# Patient Record
Sex: Male | Born: 1935 | Race: White | Hispanic: No | Smoking: Never smoker
Health system: Southern US, Community
[De-identification: ages and names within clinical notes are randomized; demographics above are authoritative.]

## PROBLEM LIST (undated history)

## (undated) DIAGNOSIS — M25519 Pain in unspecified shoulder: Secondary | ICD-10-CM

## (undated) DIAGNOSIS — I10 Essential (primary) hypertension: Secondary | ICD-10-CM

## (undated) DIAGNOSIS — IMO0001 Reserved for inherently not codable concepts without codable children: Secondary | ICD-10-CM

## (undated) DIAGNOSIS — E039 Hypothyroidism, unspecified: Secondary | ICD-10-CM

## (undated) DIAGNOSIS — Z951 Presence of aortocoronary bypass graft: Secondary | ICD-10-CM

## (undated) DIAGNOSIS — H269 Unspecified cataract: Secondary | ICD-10-CM

## (undated) DIAGNOSIS — C4360 Malignant melanoma of unspecified upper limb, including shoulder: Secondary | ICD-10-CM

## (undated) DIAGNOSIS — M25529 Pain in unspecified elbow: Secondary | ICD-10-CM

## (undated) DIAGNOSIS — R609 Edema, unspecified: Secondary | ICD-10-CM

## (undated) DIAGNOSIS — I4891 Unspecified atrial fibrillation: Secondary | ICD-10-CM

## (undated) DIAGNOSIS — I712 Thoracic aortic aneurysm, without rupture: Secondary | ICD-10-CM

## (undated) DIAGNOSIS — Z8719 Personal history of other diseases of the digestive system: Secondary | ICD-10-CM

## (undated) DIAGNOSIS — Z86010 Personal history of colonic polyps: Secondary | ICD-10-CM

## (undated) DIAGNOSIS — M79609 Pain in unspecified limb: Secondary | ICD-10-CM

## (undated) DIAGNOSIS — D485 Neoplasm of uncertain behavior of skin: Secondary | ICD-10-CM

## (undated) DIAGNOSIS — K219 Gastro-esophageal reflux disease without esophagitis: Secondary | ICD-10-CM

## (undated) DIAGNOSIS — E038 Other specified hypothyroidism: Secondary | ICD-10-CM

## (undated) DIAGNOSIS — I2510 Atherosclerotic heart disease of native coronary artery without angina pectoris: Secondary | ICD-10-CM

## (undated) DIAGNOSIS — E785 Hyperlipidemia, unspecified: Secondary | ICD-10-CM

## (undated) DIAGNOSIS — E538 Deficiency of other specified B group vitamins: Secondary | ICD-10-CM

## (undated) HISTORY — DX: Hypothyroidism, unspecified: E03.9

## (undated) HISTORY — DX: Neoplasm of uncertain behavior of skin: D48.5

## (undated) HISTORY — DX: Reserved for inherently not codable concepts without codable children: IMO0001

## (undated) HISTORY — DX: Presence of aortocoronary bypass graft: Z95.1

## (undated) HISTORY — DX: Pain in unspecified elbow: M25.529

## (undated) HISTORY — DX: Essential (primary) hypertension: I10

## (undated) HISTORY — DX: Personal history of other diseases of the digestive system: Z87.19

## (undated) HISTORY — DX: Deficiency of other specified B group vitamins: E53.8

## (undated) HISTORY — DX: Pain in unspecified shoulder: M25.519

## (undated) HISTORY — DX: Atherosclerotic heart disease of native coronary artery without angina pectoris: I25.10

## (undated) HISTORY — PX: OTHER SURGICAL HISTORY: SHX169

## (undated) HISTORY — PX: CORONARY ARTERY BYPASS GRAFT: SHX141

## (undated) HISTORY — DX: Other specified hypothyroidism: E03.8

## (undated) HISTORY — DX: Malignant melanoma of unspecified upper limb, including shoulder: C43.60

## (undated) HISTORY — DX: Edema, unspecified: R60.9

## (undated) HISTORY — DX: Personal history of colonic polyps: Z86.010

## (undated) HISTORY — PX: ROTATOR CUFF REPAIR: SHX139

## (undated) HISTORY — DX: Hyperlipidemia, unspecified: E78.5

## (undated) HISTORY — DX: Pain in unspecified limb: M79.609

## (undated) HISTORY — DX: Unspecified cataract: H26.9

## (undated) HISTORY — DX: Gastro-esophageal reflux disease without esophagitis: K21.9

---

## 1998-03-29 ENCOUNTER — Inpatient Hospital Stay (HOSPITAL_COMMUNITY): Admission: RE | Admit: 1998-03-29 | Discharge: 1998-03-31 | Payer: Self-pay | Admitting: Specialist

## 1998-03-29 ENCOUNTER — Encounter: Payer: Self-pay | Admitting: Specialist

## 2000-12-31 ENCOUNTER — Inpatient Hospital Stay (HOSPITAL_COMMUNITY): Admission: AD | Admit: 2000-12-31 | Discharge: 2001-01-10 | Payer: Self-pay | Admitting: Cardiology

## 2000-12-31 ENCOUNTER — Encounter: Payer: Self-pay | Admitting: Cardiology

## 2001-01-03 ENCOUNTER — Encounter: Payer: Self-pay | Admitting: Surgery

## 2001-01-05 ENCOUNTER — Encounter: Payer: Self-pay | Admitting: Surgery

## 2002-10-07 ENCOUNTER — Encounter: Payer: Self-pay | Admitting: Internal Medicine

## 2002-10-07 LAB — HM COLONOSCOPY

## 2004-01-25 ENCOUNTER — Ambulatory Visit: Payer: Self-pay | Admitting: Internal Medicine

## 2004-01-28 ENCOUNTER — Ambulatory Visit: Payer: Self-pay | Admitting: Internal Medicine

## 2004-04-11 ENCOUNTER — Ambulatory Visit: Payer: Self-pay | Admitting: Internal Medicine

## 2004-04-13 ENCOUNTER — Ambulatory Visit: Payer: Self-pay | Admitting: Internal Medicine

## 2004-09-26 ENCOUNTER — Ambulatory Visit: Payer: Self-pay | Admitting: Internal Medicine

## 2004-09-27 ENCOUNTER — Ambulatory Visit: Payer: Self-pay | Admitting: Cardiology

## 2004-09-29 ENCOUNTER — Ambulatory Visit: Payer: Self-pay | Admitting: Internal Medicine

## 2004-12-29 ENCOUNTER — Ambulatory Visit: Payer: Self-pay | Admitting: Internal Medicine

## 2005-01-20 ENCOUNTER — Ambulatory Visit: Payer: Self-pay | Admitting: Internal Medicine

## 2005-01-23 ENCOUNTER — Ambulatory Visit: Payer: Self-pay | Admitting: Internal Medicine

## 2005-01-25 ENCOUNTER — Ambulatory Visit: Payer: Self-pay | Admitting: Internal Medicine

## 2005-04-20 ENCOUNTER — Ambulatory Visit: Payer: Self-pay | Admitting: Internal Medicine

## 2005-04-25 ENCOUNTER — Ambulatory Visit: Payer: Self-pay | Admitting: Internal Medicine

## 2005-05-27 ENCOUNTER — Ambulatory Visit: Payer: Self-pay | Admitting: Family Medicine

## 2005-07-17 ENCOUNTER — Ambulatory Visit: Payer: Self-pay | Admitting: Cardiovascular Disease

## 2005-07-17 ENCOUNTER — Ambulatory Visit: Payer: Self-pay | Admitting: Internal Medicine

## 2005-07-19 ENCOUNTER — Ambulatory Visit: Payer: Self-pay | Admitting: Internal Medicine

## 2005-10-27 ENCOUNTER — Ambulatory Visit: Payer: Self-pay | Admitting: Internal Medicine

## 2005-10-31 ENCOUNTER — Ambulatory Visit: Payer: Self-pay | Admitting: Internal Medicine

## 2005-11-14 ENCOUNTER — Ambulatory Visit: Payer: Self-pay | Admitting: Cardiovascular Disease

## 2005-12-26 ENCOUNTER — Ambulatory Visit: Payer: Self-pay | Admitting: Cardiovascular Disease

## 2006-01-26 ENCOUNTER — Ambulatory Visit: Payer: Self-pay | Admitting: Internal Medicine

## 2006-01-26 LAB — CONVERTED CEMR LAB
Creatinine, Ser: 1.2 mg/dL (ref 0.4–1.5)
Glucose, Bld: 117 mg/dL — ABNORMAL HIGH (ref 70–99)
Potassium: 4 meq/L (ref 3.5–5.1)
Sodium: 142 meq/L (ref 135–145)

## 2006-01-30 ENCOUNTER — Ambulatory Visit: Payer: Self-pay | Admitting: Internal Medicine

## 2006-05-08 ENCOUNTER — Ambulatory Visit: Payer: Self-pay | Admitting: Internal Medicine

## 2006-05-08 LAB — CONVERTED CEMR LAB
BUN: 18 mg/dL (ref 6–23)
Cholesterol: 127 mg/dL (ref 0–200)
Creatinine, Ser: 1 mg/dL (ref 0.4–1.5)
Glucose, Bld: 95 mg/dL (ref 70–99)
HDL: 39.7 mg/dL (ref >39.0)
LDL Cholesterol: 70 mg/dL (ref 0–99)
Potassium: 3.9 meq/L (ref 3.5–5.1)
Sodium: 145 meq/L (ref 135–145)
TSH: 6.26 microintl units/mL — ABNORMAL HIGH (ref 0.35–5.50)
Total CHOL/HDL Ratio: 3.2
Triglycerides: 86 mg/dL (ref 0–149)
VLDL: 17 mg/dL (ref 0–40)

## 2006-05-10 ENCOUNTER — Ambulatory Visit: Payer: Self-pay | Admitting: Internal Medicine

## 2006-06-12 ENCOUNTER — Ambulatory Visit: Payer: Self-pay | Admitting: Pulmonary Disease

## 2006-07-30 ENCOUNTER — Ambulatory Visit: Payer: Self-pay | Admitting: Cardiovascular Disease

## 2006-08-13 ENCOUNTER — Ambulatory Visit: Payer: Self-pay | Admitting: Internal Medicine

## 2006-08-13 LAB — CONVERTED CEMR LAB
BUN: 17 mg/dL (ref 6–23)
CO2: 30 meq/L (ref 19–32)
Calcium: 9 mg/dL (ref 8.4–10.5)
Chloride: 107 meq/L (ref 96–112)
Creatinine, Ser: 0.9 mg/dL (ref 0.4–1.5)
GFR calc Af Amer: 105 mL/min
GFR calc non Af Amer: 87 mL/min
Glucose, Bld: 94 mg/dL (ref 70–99)
Potassium: 3.6 meq/L (ref 3.5–5.1)
Sodium: 142 meq/L (ref 135–145)
TSH: 5.16 microintl units/mL (ref 0.35–5.50)

## 2006-08-14 ENCOUNTER — Ambulatory Visit: Payer: Self-pay | Admitting: Internal Medicine

## 2006-11-28 ENCOUNTER — Ambulatory Visit: Payer: Self-pay

## 2006-12-14 ENCOUNTER — Encounter: Payer: Self-pay | Admitting: Internal Medicine

## 2006-12-14 DIAGNOSIS — E785 Hyperlipidemia, unspecified: Secondary | ICD-10-CM

## 2006-12-14 DIAGNOSIS — K219 Gastro-esophageal reflux disease without esophagitis: Secondary | ICD-10-CM

## 2006-12-14 DIAGNOSIS — E039 Hypothyroidism, unspecified: Secondary | ICD-10-CM | POA: Insufficient documentation

## 2006-12-14 DIAGNOSIS — Z860100 Personal history of colon polyps, unspecified: Secondary | ICD-10-CM

## 2006-12-14 DIAGNOSIS — I10 Essential (primary) hypertension: Secondary | ICD-10-CM

## 2006-12-14 DIAGNOSIS — I2510 Atherosclerotic heart disease of native coronary artery without angina pectoris: Secondary | ICD-10-CM

## 2006-12-14 HISTORY — DX: Atherosclerotic heart disease of native coronary artery without angina pectoris: I25.10

## 2006-12-14 HISTORY — DX: Personal history of colon polyps, unspecified: Z86.0100

## 2006-12-14 HISTORY — DX: Personal history of colonic polyps: Z86.010

## 2006-12-14 HISTORY — DX: Gastro-esophageal reflux disease without esophagitis: K21.9

## 2006-12-14 HISTORY — DX: Essential (primary) hypertension: I10

## 2006-12-14 HISTORY — DX: Hypothyroidism, unspecified: E03.9

## 2006-12-14 HISTORY — DX: Hyperlipidemia, unspecified: E78.5

## 2006-12-17 ENCOUNTER — Ambulatory Visit: Payer: Self-pay | Admitting: Internal Medicine

## 2006-12-17 LAB — CONVERTED CEMR LAB
ALT: 17 units/L (ref 0–53)
Albumin: 3.6 g/dL (ref 3.5–5.2)
Alkaline Phosphatase: 57 units/L (ref 39–117)
BUN: 18 mg/dL (ref 6–23)
CO2: 31 meq/L (ref 19–32)
Calcium: 9.1 mg/dL (ref 8.4–10.5)
GFR calc Af Amer: 105 mL/min
GFR calc non Af Amer: 87 mL/min
LDL Cholesterol: 74 mg/dL (ref 0–99)
Total CHOL/HDL Ratio: 3.5
VLDL: 15 mg/dL (ref 0–40)

## 2006-12-19 ENCOUNTER — Ambulatory Visit: Payer: Self-pay | Admitting: Internal Medicine

## 2006-12-19 DIAGNOSIS — M25519 Pain in unspecified shoulder: Secondary | ICD-10-CM

## 2006-12-19 HISTORY — DX: Pain in unspecified shoulder: M25.519

## 2007-01-01 ENCOUNTER — Emergency Department (HOSPITAL_COMMUNITY): Admission: EM | Admit: 2007-01-01 | Discharge: 2007-01-01 | Payer: Self-pay | Admitting: Emergency Medicine

## 2007-01-11 ENCOUNTER — Encounter: Payer: Self-pay | Admitting: Internal Medicine

## 2007-01-30 ENCOUNTER — Ambulatory Visit: Payer: Self-pay | Admitting: Cardiovascular Disease

## 2007-04-08 ENCOUNTER — Ambulatory Visit: Payer: Self-pay | Admitting: Internal Medicine

## 2007-04-08 DIAGNOSIS — E038 Other specified hypothyroidism: Secondary | ICD-10-CM

## 2007-04-08 HISTORY — DX: Other specified hypothyroidism: E03.8

## 2007-04-08 LAB — CONVERTED CEMR LAB
PSA: 2.38 ng/mL (ref 0.10–4.00)
TSH: 3.52 microintl units/mL (ref 0.35–5.50)

## 2007-04-10 ENCOUNTER — Ambulatory Visit: Payer: Self-pay | Admitting: Internal Medicine

## 2007-04-10 DIAGNOSIS — IMO0001 Reserved for inherently not codable concepts without codable children: Secondary | ICD-10-CM

## 2007-04-10 HISTORY — DX: Reserved for inherently not codable concepts without codable children: IMO0001

## 2007-08-05 ENCOUNTER — Ambulatory Visit: Payer: Self-pay | Admitting: Cardiovascular Disease

## 2007-08-05 ENCOUNTER — Ambulatory Visit: Payer: Self-pay | Admitting: Internal Medicine

## 2007-08-05 LAB — CONVERTED CEMR LAB
ALT: 18 units/L (ref 0–53)
AST: 27 units/L (ref 0–37)
Cholesterol: 128 mg/dL (ref 0–200)
HDL: 39.6 mg/dL (ref >39.0)
Total CK: 167 units/L (ref 7–195)
Total Protein: 6.5 g/dL (ref 6.0–8.3)
VLDL: 13 mg/dL (ref 0–40)
Vit D, 1,25-Dihydroxy: 35 (ref 30–89)

## 2007-08-06 ENCOUNTER — Encounter: Payer: Self-pay | Admitting: Internal Medicine

## 2007-08-07 ENCOUNTER — Ambulatory Visit: Payer: Self-pay | Admitting: Internal Medicine

## 2007-08-16 ENCOUNTER — Encounter: Payer: Self-pay | Admitting: Internal Medicine

## 2007-08-28 ENCOUNTER — Encounter: Admission: RE | Admit: 2007-08-28 | Discharge: 2007-08-28 | Payer: Self-pay | Admitting: Specialist

## 2007-11-19 ENCOUNTER — Telehealth: Payer: Self-pay | Admitting: Internal Medicine

## 2007-12-05 ENCOUNTER — Ambulatory Visit: Payer: Self-pay | Admitting: Internal Medicine

## 2007-12-05 LAB — CONVERTED CEMR LAB
BUN: 22 mg/dL (ref 6–23)
Chloride: 106 meq/L (ref 96–112)
GFR calc Af Amer: 92 mL/min
GFR calc non Af Amer: 76 mL/min
Glucose, Bld: 97 mg/dL (ref 70–99)
Potassium: 3.6 meq/L (ref 3.5–5.1)
Sodium: 142 meq/L (ref 135–145)
TSH: 3.92 microintl units/mL (ref 0.35–5.50)
Total CK: 115 units/L (ref 7–195)
VLDL: 23 mg/dL (ref 0–40)
Vitamin B-12: 208 pg/mL — ABNORMAL LOW (ref 211–911)

## 2007-12-09 ENCOUNTER — Ambulatory Visit: Payer: Self-pay | Admitting: Internal Medicine

## 2007-12-09 DIAGNOSIS — E538 Deficiency of other specified B group vitamins: Secondary | ICD-10-CM

## 2007-12-09 HISTORY — DX: Deficiency of other specified B group vitamins: E53.8

## 2008-03-16 ENCOUNTER — Telehealth: Payer: Self-pay | Admitting: Internal Medicine

## 2008-03-16 ENCOUNTER — Ambulatory Visit: Payer: Self-pay | Admitting: Internal Medicine

## 2008-03-17 LAB — CONVERTED CEMR LAB
ALT: 17 units/L (ref 0–53)
AST: 27 units/L (ref 0–37)
Albumin: 3.8 g/dL (ref 3.5–5.2)
Alkaline Phosphatase: 65 units/L (ref 39–117)
BUN: 19 mg/dL (ref 6–23)
Bilirubin, Direct: 0.2 mg/dL (ref 0.0–0.3)
CO2: 28 meq/L (ref 19–32)
GFR calc Af Amer: 104 mL/min
Glucose, Bld: 89 mg/dL (ref 70–99)
HDL: 33.7 mg/dL — ABNORMAL LOW (ref >39.0)
Potassium: 3.6 meq/L (ref 3.5–5.1)
Sodium: 142 meq/L (ref 135–145)
Total Protein: 6.2 g/dL (ref 6.0–8.3)

## 2008-03-18 ENCOUNTER — Ambulatory Visit: Payer: Self-pay | Admitting: Internal Medicine

## 2008-06-19 ENCOUNTER — Ambulatory Visit: Payer: Self-pay | Admitting: Internal Medicine

## 2008-06-19 LAB — CONVERTED CEMR LAB
ALT: 19 U/L (ref 0–53)
AST: 23 U/L (ref 0–37)
Albumin: 3.8 g/dL (ref 3.5–5.2)
Alkaline Phosphatase: 59 U/L (ref 39–117)
BUN: 19 mg/dL (ref 6–23)
Bilirubin, Direct: 0.2 mg/dL (ref 0.0–0.3)
CO2: 28 meq/L (ref 19–32)
Calcium: 9.4 mg/dL (ref 8.4–10.5)
Chloride: 111 meq/L (ref 96–112)
Cholesterol: 166 mg/dL (ref 0–200)
Creatinine, Ser: 0.9 mg/dL (ref 0.4–1.5)
GFR calc non Af Amer: 86.02 mL/min (ref >60)
Glucose, Bld: 88 mg/dL (ref 70–99)
HDL: 34.5 mg/dL — ABNORMAL LOW (ref >39.00)
LDL Cholesterol: 113 mg/dL — ABNORMAL HIGH (ref 0–99)
Potassium: 3.9 meq/L (ref 3.5–5.1)
Sodium: 144 meq/L (ref 135–145)
TSH: 4.85 u[IU]/mL (ref 0.35–5.50)
Total Bilirubin: 1 mg/dL (ref 0.3–1.2)
Total CHOL/HDL Ratio: 5
Total Protein: 6.5 g/dL (ref 6.0–8.3)
Triglycerides: 95 mg/dL (ref 0.0–149.0)
VLDL: 19 mg/dL (ref 0.0–40.0)
Vitamin B-12: 395 pg/mL (ref 211–911)

## 2008-06-23 ENCOUNTER — Ambulatory Visit: Payer: Self-pay | Admitting: Internal Medicine

## 2008-06-23 DIAGNOSIS — D485 Neoplasm of uncertain behavior of skin: Secondary | ICD-10-CM

## 2008-06-23 HISTORY — DX: Neoplasm of uncertain behavior of skin: D48.5

## 2008-07-15 ENCOUNTER — Ambulatory Visit: Payer: Self-pay | Admitting: Internal Medicine

## 2008-07-15 ENCOUNTER — Encounter: Payer: Self-pay | Admitting: Internal Medicine

## 2008-08-11 ENCOUNTER — Ambulatory Visit: Payer: Self-pay | Admitting: Internal Medicine

## 2008-08-26 DIAGNOSIS — Z8719 Personal history of other diseases of the digestive system: Secondary | ICD-10-CM

## 2008-08-26 DIAGNOSIS — Z951 Presence of aortocoronary bypass graft: Secondary | ICD-10-CM

## 2008-08-26 HISTORY — DX: Personal history of other diseases of the digestive system: Z87.19

## 2008-08-26 HISTORY — DX: Presence of aortocoronary bypass graft: Z95.1

## 2008-08-27 ENCOUNTER — Ambulatory Visit: Payer: Self-pay | Admitting: Cardiovascular Disease

## 2008-08-27 DIAGNOSIS — R609 Edema, unspecified: Secondary | ICD-10-CM

## 2008-08-27 HISTORY — DX: Edema, unspecified: R60.9

## 2008-09-07 ENCOUNTER — Telehealth: Payer: Self-pay | Admitting: Internal Medicine

## 2008-09-07 DIAGNOSIS — C4360 Malignant melanoma of unspecified upper limb, including shoulder: Secondary | ICD-10-CM

## 2008-09-07 HISTORY — DX: Malignant melanoma of unspecified upper limb, including shoulder: C43.60

## 2008-09-21 ENCOUNTER — Encounter: Payer: Self-pay | Admitting: Internal Medicine

## 2008-09-25 ENCOUNTER — Encounter: Payer: Self-pay | Admitting: Internal Medicine

## 2008-10-06 ENCOUNTER — Encounter: Payer: Self-pay | Admitting: Internal Medicine

## 2008-10-16 ENCOUNTER — Ambulatory Visit: Payer: Self-pay | Admitting: Internal Medicine

## 2008-10-16 LAB — CONVERTED CEMR LAB
BUN: 23 mg/dL (ref 6–23)
CO2: 31 meq/L (ref 19–32)
Chloride: 111 meq/L (ref 96–112)
Creatinine, Ser: 1 mg/dL (ref 0.4–1.5)
Folate: >20 ng/mL
Potassium: 3.8 meq/L (ref 3.5–5.1)
Vitamin B-12: 986 pg/mL — ABNORMAL HIGH (ref 211–911)

## 2008-10-19 ENCOUNTER — Ambulatory Visit: Payer: Self-pay | Admitting: Internal Medicine

## 2009-01-28 ENCOUNTER — Ambulatory Visit: Payer: Self-pay | Admitting: Internal Medicine

## 2009-01-28 LAB — CONVERTED CEMR LAB
Chloride: 107 meq/L (ref 96–112)
Cholesterol: 237 mg/dL — ABNORMAL HIGH (ref 0–200)
GFR calc non Af Amer: 76.06 mL/min (ref >60)
Glucose, Bld: 88 mg/dL (ref 70–99)
HDL: 34.2 mg/dL — ABNORMAL LOW (ref >39.00)
Potassium: 3.6 meq/L (ref 3.5–5.1)
Sodium: 141 meq/L (ref 135–145)
Total CHOL/HDL Ratio: 7
VLDL: 21.6 mg/dL (ref 0.0–40.0)

## 2009-01-29 ENCOUNTER — Encounter: Payer: Self-pay | Admitting: Internal Medicine

## 2009-02-01 ENCOUNTER — Ambulatory Visit: Payer: Self-pay | Admitting: Internal Medicine

## 2009-03-15 ENCOUNTER — Ambulatory Visit: Payer: Self-pay | Admitting: Cardiovascular Disease

## 2009-05-28 ENCOUNTER — Ambulatory Visit: Payer: Self-pay | Admitting: Internal Medicine

## 2009-05-28 LAB — CONVERTED CEMR LAB
CO2: 31 meq/L (ref 19–32)
Calcium: 9.2 mg/dL (ref 8.4–10.5)
Creatinine, Ser: 1 mg/dL (ref 0.4–1.5)
GFR calc non Af Amer: 75.99 mL/min (ref >60)
Glucose, Bld: 86 mg/dL (ref 70–99)
Total CHOL/HDL Ratio: 4
Total CK: 123 units/L (ref 7–232)
Vitamin B-12: 809 pg/mL (ref 211–911)

## 2009-06-01 ENCOUNTER — Ambulatory Visit: Payer: Self-pay | Admitting: Internal Medicine

## 2009-07-12 ENCOUNTER — Telehealth: Payer: Self-pay | Admitting: Internal Medicine

## 2009-10-04 ENCOUNTER — Ambulatory Visit: Payer: Self-pay | Admitting: Internal Medicine

## 2009-10-04 LAB — CONVERTED CEMR LAB
GFR calc non Af Amer: 101.14 mL/min (ref >60)
Potassium: 3.8 meq/L (ref 3.5–5.1)
Sodium: 143 meq/L (ref 135–145)

## 2009-10-06 ENCOUNTER — Ambulatory Visit: Payer: Self-pay | Admitting: Internal Medicine

## 2009-10-06 DIAGNOSIS — M25529 Pain in unspecified elbow: Secondary | ICD-10-CM

## 2009-10-06 HISTORY — DX: Pain in unspecified elbow: M25.529

## 2009-11-03 ENCOUNTER — Ambulatory Visit: Payer: Self-pay | Admitting: Internal Medicine

## 2010-01-07 ENCOUNTER — Ambulatory Visit: Payer: Self-pay | Admitting: Internal Medicine

## 2010-01-07 LAB — CONVERTED CEMR LAB
ALT: 14 units/L (ref 0–53)
AST: 25 units/L (ref 0–37)
Alkaline Phosphatase: 55 units/L (ref 39–117)
Bilirubin, Direct: 0.2 mg/dL (ref 0.0–0.3)
CO2: 28 meq/L (ref 19–32)
Calcium: 9 mg/dL (ref 8.4–10.5)
Chloride: 106 meq/L (ref 96–112)
HDL: 35 mg/dL — ABNORMAL LOW (ref >39.00)
Potassium: 4.1 meq/L (ref 3.5–5.1)
Sodium: 140 meq/L (ref 135–145)
Total CHOL/HDL Ratio: 4
Total Protein: 6 g/dL (ref 6.0–8.3)

## 2010-01-11 ENCOUNTER — Ambulatory Visit: Payer: Self-pay | Admitting: Internal Medicine

## 2010-01-29 ENCOUNTER — Telehealth: Payer: Self-pay | Admitting: Internal Medicine

## 2010-03-17 ENCOUNTER — Ambulatory Visit
Admission: RE | Admit: 2010-03-17 | Discharge: 2010-03-17 | Payer: Self-pay | Source: Home / Self Care | Attending: Cardiovascular Disease | Admitting: Cardiovascular Disease

## 2010-03-17 ENCOUNTER — Encounter: Payer: Self-pay | Admitting: Cardiovascular Disease

## 2010-03-31 NOTE — Miscellaneous (Signed)
Summary: Special procedure/[facility]  Special procedure/[facility]   Imported By: Lester [place] 11/05/2009 10:52:06  _____________________________________________________________________  External Attachment:    Type:   Image     Comment:   External Document

## 2010-03-31 NOTE — Progress Notes (Signed)
Summary: RF - B12  Phone Note From Pharmacy   Summary of Call: Pharm is req refill of B12 solution for injection. Not on med list, Please advise.  Initial call taken by: Lamar Sprinkles, CMA,  January 29, 2010 11:48 AM  Follow-up for Phone Call        ok x 12 months  Follow-up by: Tresa Garter MD,  January 29, 2010 1:37 PM    Prescriptions: COBAL-1000 1000 MCG/ML SOLN (CYANOCOBALAMIN) 1 cc q 2 wks sq  #10 x 12   Entered by:   Lamar Sprinkles, CMA   Authorized by:   Tresa Garter MD   Signed by:   Lamar Sprinkles, CMA on 01/31/2010   Method used:   Electronically to        [facility]  [address] [id number] (ret[facility]       [address]       [place], [place]  [address]       Ph: [phone] or [phone]       Fax: [phone]   RxID:   [id number]

## 2010-03-31 NOTE — Assessment & Plan Note (Signed)
Summary: f40mon  Medications Added CRESTOR 20 MG TABS (ROSUVASTATIN CALCIUM) 1 by mouth once daily (every 3rd day)      Allergies Added:   Referring Provider:  Sula Soda, MD Primary Provider:  Sula Soda, MD   History of Present Illness: Barry Molina is seen today for F/U of elevated lipids, CAD with CABG in 2002.  There was a quesiton of leg pain with statins but this may have been more of an orthopedic issue.  Since then he has been placed on Crestor every third day starting at 5mg  and now up to 20mg .  I think it would be better to just have him take 5mg /day and hie will discuss this with Dr. Nolon Lennert.  He had a normal myovue in 2008 with no SSCP and hie is active on a daily basis.    Current Problems (verified): 1)  Melanoma, Arm  (ICD-172.6) 2)  Melanoma, Upper Arm  (ICD-172.6) 3)  Edema  (ICD-782.3) 4)  Diverticulosis, Colon, Hx of  (ICD-V12.79) 5)  Hemorrhoids, Hx of  (ICD-V12.79) 6)  Allergic Rhinitis, Hx of  (ICD-477.9) 7)  Right Ankle Surgery,hx of  () 8)  Degenerative Disc Disease, Hx of  (ICD-722.6) 9)  Coronary Artery Bypass Graft, Hx of  (ICD-V45.81) 10)  Rotator Cuff Repair, Hx of Bilateral  (ICD-V45.89) 11)  Neoplasm, Skin, Uncertain Behavior  (ICD-238.2) 12)  B12 Deficiency  (ICD-266.2) 13)  Coronary Artery Disease  (ICD-414.00) 14)  Hyperlipidemia  (ICD-272.4) 15)  Hypothyroidism  (ICD-244.9) 16)  Hypertension  (ICD-401.9) 17)  Gerd  (ICD-530.81) 18)  Muscle Pain  (ICD-729.1) 19)  Other Specified Acquired Hypothyroidism  (ICD-244.8) 20)  Shoulder Pain  (ICD-719.41) 21)  Colonic Polyps, Hx of  (ICD-V12.72)  Current Medications (verified): 1)  Isosorbide Mononitrate Cr 30 Mg Tb24 (Isosorbide Mononitrate) .... 1/2 Once Daily 2)  Metoprolol Succinate 25 Mg Tb24 (Metoprolol Succinate) .... 1/2 By Mouth Qd 3)  Vitamin D3 1000 Unit Caps (Cholecalciferol) .Marland Kitchen.. 1 Tab By Mouth Once Daily 4)  Aspirin 325 Mg  Tbec (Aspirin) .... Once Daily 5)  Cobal-1000 1000 Mcg/ml  Soln (Cyanocobalamin) .Marland Kitchen.. 1 Cc Q 2 Wks Sq 6)  Bd Eclipse Syringe 27g X 1/2" 1 Ml Misc (Syringe/needle (Disp)) .... As Dirr 7)  Levothyroxine Sodium 112 Mcg Tabs (Levothyroxine Sodium) .Marland Kitchen.. 1 Tab By Mouth Once Daily 8)  Prilosec Otc 20 Mg Tbec (Omeprazole Magnesium) .... 2 By Mouth Qd 9)  Flonase 50 Mcg/act Susp (Fluticasone Propionate) .Marland Kitchen.. 1 Spr Each Nostr Qd As Needed 10)  Crestor 20 Mg Tabs (Rosuvastatin Calcium) .Marland Kitchen.. 1 By Mouth Once Daily (Every 3rd Day)  Allergies (verified): 1)  ! Morphine 2)  Vytorin  Past History:  Past Medical History: Last updated: 10/19/2008 DIVERTICULOSIS, COLON, HX OF (ICD-V12.79) HEMORRHOIDS, HX OF (ICD-V12.79) ALLERGIC RHINITIS, HX OF (ICD-477.9) DEGENERATIVE DISC DISEASE, HX OF (ICD-722.6) NEOPLASM, SKIN, UNCERTAIN BEHAVIOR (ICD-238.2) B12 DEFICIENCY (ICD-266.2) CORONARY ARTERY DISEASE (ICD-414.00) HYPERLIPIDEMIA (ICD-272.4) HYPOTHYROIDISM (ICD-244.9) HYPERTENSION (ICD-401.9) GERD (ICD-530.81) MUSCLE PAIN (ICD-729.1) OTHER SPECIFIED ACQUIRED HYPOTHYROIDISM (ICD-244.8) SHOULDER PAIN (ICD-719.41) COLONIC POLYPS, HX OF (ICD-V12.72) Melanoma in situ L arm 2010    Past Surgical History: Last updated: 08/26/2008 * RIGHT ANKLE SURGERY,HX OF CORONARY ARTERY BYPASS GRAFT, HX OF (ICD-V45.81) ROTATOR CUFF REPAIR, HX OF BILATERAL (ICD-V45.89)  Family History: Last updated: 08/26/2008 Family History Hypertension No FH of Colon Cancer.  Social History: Last updated: 08/26/2008 Retired 2 girls Married Never Smoked Alcohol Use - no Illicit Drug Use - no Patient gets regular exercise.  Review of Systems  Denies fever, malais, weight loss, blurry vision, decreased visual acuity, cough, sputum, SOB, hemoptysis, pleuritic pain, palpitaitons, heartburn, abdominal pain, melena, lower extremity edema, claudication, or rash. All other systems reviewed and negative  Vital Signs:  Patient profile:   77 year old male Height:      71  inches Weight:      221 pounds BMI:     30.93 Pulse rate:   70 / minute BP sitting:   112 / 74  (left arm) Cuff size:   regular  Vitals Entered By: Hardin Negus, RMA (March 15, 2009 10:49 AM)  Physical Exam  General:  Affect appropriate Healthy:  appears stated age HEENT: normal Neck supple with no adenopathy JVP normal no bruits no thyromegaly Lungs clear with no wheezing and good diaphragmatic motion Heart:  S1/S2 no murmur,rub, gallop or click PMI normal Abdomen: benighn, BS positve, no tenderness, no AAA no bruit.  No HSM or HJR Distal pulses intact with no bruits No edema Neuro non-focal Skin warm and dry    Impression & Recommendations:  Problem # 1:  CORONARY ARTERY BYPASS GRAFT, HX OF (ICD-V45.81) Stable no angina.  Continue ASA and Risk factor modification.  CABG in 2002 and nomal myovue in 2008  Problem # 2:  HYPERLIPIDEMIA (ICD-272.4) Needs to be on med given age of grafts but would prefer low dose of a daily statin.  To F/U with Plotnikov to discuss.  Dont think leg pain is from statin His updated medication list for this problem includes:    Crestor 20 Mg Tabs (Rosuvastatin calcium) .Marland Kitchen... 1 by mouth once daily (every 3rd day)  CHOL: 237 (01/28/2009)   LDL: 113 (06/19/2008)   HDL: 34.20 (01/28/2009)   TG: 108.0 (01/28/2009)  Patient Instructions: 1)  Your physician recommends that you schedule a follow-up appointment in:12 months. The office will mail you a reminder card 2 months prior appointment date.

## 2010-03-31 NOTE — Assessment & Plan Note (Signed)
Summary: yearly      Allergies Added:   Referring Provider:  Sula Soda, MD Primary Provider:  Sula Soda, MD  CC:  check up.  History of Present Illness: Barry Molina is seen today for F/U of elevated lipids, CAD with CABG in 2002.  There was a quesiton of leg pain with statins but this may have been more of an orthopedic issue.  Since then he has been placed on Crestor every third day starting at 5mg  and now up to 20mg .  I think it would be better to just have him take 5mg /day and hie will discuss this with Dr. Nolon Lennert.  He had a normal myovue in 2008 with no SSCP and hie is active on a daily basis.  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    Current Problems (verified): 1)  Elbow Pain  (ICD-719.42) 2)  Coronary Artery Disease  (ICD-414.00) 3)  Hypertension  (ICD-401.9) 4)  Coronary Artery Bypass Graft, Hx of  (ICD-V45.81) 5)  Hyperlipidemia  (ICD-272.4) 6)  Hypothyroidism  (ICD-244.9) 7)  B12 Deficiency  (ICD-266.2) 8)  Gerd  (ICD-530.81) 9)  Melanoma, Arm  (ICD-172.6) 10)  Melanoma, Upper Arm  (ICD-172.6) 11)  Edema  (ICD-782.3) 12)  Diverticulosis, Colon, Hx of  (ICD-V12.79) 13)  Hemorrhoids, Hx of  (ICD-V12.79) 14)  Allergic Rhinitis, Hx of  (ICD-477.9) 15)  Right Ankle Surgery,hx of  () 16)  Degenerative Disc Disease, Hx of  (ICD-722.6) 17)  Rotator Cuff Repair, Hx of Bilateral  (ICD-V45.89) 18)  Neoplasm, Skin, Uncertain Behavior  (ICD-238.2) 19)  Muscle Pain  (ICD-729.1) 20)  Other Specified Acquired Hypothyroidism  (ICD-244.8) 21)  Shoulder Pain  (ICD-719.41) 22)  Colonic Polyps, Hx of  (ICD-V12.72)  Current Medications (verified): 1)  Isosorbide Mononitrate Cr 30 Mg Tb24 (Isosorbide Mononitrate) .... 1/2 Once Daily 2)  Metoprolol Succinate 25 Mg Tb24 (Metoprolol Succinate) .... 1/2 By Mouth Qd 3)  Vitamin D3 1000 Unit Caps (Cholecalciferol) .Marland Kitchen.. 1 Tab By Mouth Once Daily 4)  Aspirin 325 Mg  Tbec (Aspirin) .... Once  Daily 5)  Cobal-1000 1000 Mcg/ml Soln (Cyanocobalamin) .Marland Kitchen.. 1 Cc Q 2 Wks Sq 6)  Bd Eclipse Syringe 27g X 1/2" 1 Ml Misc (Syringe/needle (Disp)) .... As Dirr 7)  Levothyroxine Sodium 112 Mcg Tabs (Levothyroxine Sodium) .Marland Kitchen.. 1 Tab By Mouth Once Daily 8)  Prilosec Otc 20 Mg Tbec (Omeprazole Magnesium) .Marland Kitchen.. 1 Once Daily 9)  Flonase 50 Mcg/act Susp (Fluticasone Propionate) .Marland Kitchen.. 1 Spr Each Nostr Qd As Needed 10)  Crestor 20 Mg Tabs (Rosuvastatin Calcium) .Marland Kitchen.. 1 By Mouth Once Daily (Every 3rd Day)  Allergies (verified): 1)  ! Morphine 2)  Vytorin  Past History:  Past Medical History: Last updated: 10/19/2008 DIVERTICULOSIS, COLON, HX OF (ICD-V12.79) HEMORRHOIDS, HX OF (ICD-V12.79) ALLERGIC RHINITIS, HX OF (ICD-477.9) DEGENERATIVE DISC DISEASE, HX OF (ICD-722.6) NEOPLASM, SKIN, UNCERTAIN BEHAVIOR (ICD-238.2) B12 DEFICIENCY (ICD-266.2) CORONARY ARTERY DISEASE (ICD-414.00) HYPERLIPIDEMIA (ICD-272.4) HYPOTHYROIDISM (ICD-244.9) HYPERTENSION (ICD-401.9) GERD (ICD-530.81) MUSCLE PAIN (ICD-729.1) OTHER SPECIFIED ACQUIRED HYPOTHYROIDISM (ICD-244.8) SHOULDER PAIN (ICD-719.41) COLONIC POLYPS, HX OF (ICD-V12.72) Melanoma in situ L arm 2010    Past Surgical History: Last updated: 08/26/2008 * RIGHT ANKLE SURGERY,HX OF CORONARY ARTERY BYPASS GRAFT, HX OF (ICD-V45.81) ROTATOR CUFF REPAIR, HX OF BILATERAL (ICD-V45.89)  Family History: Last updated: 08/26/2008 Family History Hypertension No FH of Colon Cancer.  Social History: Last updated: 08/26/2008 Retired 2 girls Married Never Smoked Alcohol Use - no Illicit Drug Use - no Patient gets regular exercise.  Review of Systems       Denies fever, malais, weight loss, blurry vision, decreased visual acuity, cough, sputum, SOB, hemoptysis, pleuritic pain, palpitaitons, heartburn, abdominal pain, melena, lower extremity edema, claudication, or rash.   Vital Signs:  Patient profile:   77 year old male Height:      71 inches Weight:       217 pounds BMI:     30.37 Pulse rate:   77 / minute Resp:     16 per minute BP sitting:   141 / 78  (left arm)  Vitals Entered By: Kem Parkinson (March 17, 2010 9:25 AM)   Physical Exam  General:  Affect appropriate Healthy:  appears stated age HEENT: normal Neck supple with no adenopathy JVP normal no bruits no thyromegaly Lungs clear with no wheezing and good diaphragmatic motion Heart:  S1/S2 no murmur,rub, gallop or click PMI normal Abdomen: benighn, BS positve, no tenderness, no AAA no bruit.  No HSM or HJR Distal pulses intact with no bruits No edema Neuro non-focal Skin warm and dry    Impression & Recommendations:  Problem # 1:  CORONARY ARTERY DISEASE (ICD-414.00) Stable no angina  Non ischemic myovue 2008 His updated medication list for this problem includes:    Isosorbide Mononitrate Cr 30 Mg Tb24 (Isosorbide mononitrate) .Marland Kitchen... 1/2 once daily    Metoprolol Succinate 25 Mg Tb24 (Metoprolol succinate) .Marland Kitchen... 1/2 by mouth qd    Aspirin 325 Mg Tbec (Aspirin) ..... Once daily  Problem # 2:  HYPERLIPIDEMIA (ICD-272.4) Continue crestor  Last TC was only 133 His updated medication list for this problem includes:    Crestor 20 Mg Tabs (Rosuvastatin calcium) .Marland Kitchen... 1 by mouth once daily (every 3rd day)  Problem # 3:  HYPERTENSION (ICD-401.9) Well controlled Continue low sodium diet His updated medication list for this problem includes:    Metoprolol Succinate 25 Mg Tb24 (Metoprolol succinate) .Marland Kitchen... 1/2 by mouth qd    Aspirin 325 Mg Tbec (Aspirin) ..... Once daily  Patient Instructions: 1)  Your physician wants you to follow-up in: 6 MONTHS  You will receive a reminder letter in the mail two months in advance. If you don't receive a letter, please call our office to schedule the follow-up appointment.

## 2010-03-31 NOTE — Assessment & Plan Note (Signed)
Summary: 3 MO ROV /NWS  #   Vital Signs:  Patient profile:   77 year old Molina Height:      71 inches Weight:      214 pounds BMI:     29.95 Temp:     98.6 degrees F oral Pulse rate:   76 / minute Pulse rhythm:   regular Resp:     16 per minute BP sitting:   130 / 84  (left arm) Cuff size:   regular  Vitals Entered By: Lanier Prude, CMA(AAMA) (January 11, 2010 8:09 AM) CC: 3 mo f/u Is Patient Diabetic? No   Primary Care Provider:  Sula Soda, MD  CC:  3 mo f/u.  History of Present Illness: The patient presents for a follow up of hypertension, CAD, hyperlipidemia   Current Medications (verified): 1)  Isosorbide Mononitrate Cr 30 Mg Tb24 (Isosorbide Mononitrate) .... 1/2 Once Daily 2)  Metoprolol Succinate 25 Mg Tb24 (Metoprolol Succinate) .... 1/2 By Mouth Qd 3)  Vitamin D3 1000 Unit Caps (Cholecalciferol) .Marland Kitchen.. 1 Tab By Mouth Once Daily 4)  Aspirin 325 Mg  Tbec (Aspirin) .... Once Daily 5)  Cobal-1000 1000 Mcg/ml Soln (Cyanocobalamin) .Marland Kitchen.. 1 Cc Q 2 Wks Sq 6)  Bd Eclipse Syringe 27g X 1/2" 1 Ml Misc (Syringe/needle (Disp)) .... As Dirr 7)  Levothyroxine Sodium 112 Mcg Tabs (Levothyroxine Sodium) .Marland Kitchen.. 1 Tab By Mouth Once Daily 8)  Prilosec Otc 20 Mg Tbec (Omeprazole Magnesium) .Marland Kitchen.. 1 Once Daily 9)  Flonase 50 Mcg/act Susp (Fluticasone Propionate) .Marland Kitchen.. 1 Spr Each Nostr Qd As Needed 10)  Crestor 20 Mg Tabs (Rosuvastatin Calcium) .Marland Kitchen.. 1 By Mouth Once Daily (Every 3rd Day) 11)  Pennsaid 1.5 % Soln (Diclofenac Sodium) .... 3-5 Gtt On Skin Three Times A Day For Pain  Allergies (verified): 1)  ! Morphine 2)  Vytorin  Past History:  Past Medical History: Last updated: 10/19/2008 DIVERTICULOSIS, COLON, HX OF (ICD-V12.79) HEMORRHOIDS, HX OF (ICD-V12.79) ALLERGIC RHINITIS, HX OF (ICD-477.9) DEGENERATIVE DISC DISEASE, HX OF (ICD-722.6) NEOPLASM, SKIN, UNCERTAIN BEHAVIOR (ICD-238.2) B12 DEFICIENCY (ICD-266.2) CORONARY ARTERY DISEASE (ICD-414.00) HYPERLIPIDEMIA  (ICD-272.4) HYPOTHYROIDISM (ICD-244.9) HYPERTENSION (ICD-401.9) GERD (ICD-530.81) MUSCLE PAIN (ICD-729.1) OTHER SPECIFIED ACQUIRED HYPOTHYROIDISM (ICD-244.8) SHOULDER PAIN (ICD-719.Barry) COLONIC POLYPS, HX OF (ICD-V12.72) Melanoma in situ L arm 2010    Social History: Last updated: 08/26/2008 Retired 2 girls Married Never Smoked Alcohol Use - no Illicit Drug Use - no Patient gets regular exercise.  Review of Systems  The patient denies hoarseness, dyspnea on exertion, abdominal pain, and melena.         balance isues  Physical Exam  General:  Affect appropriate Healthy:  appears stated age HEENT: normal Neck supple with no adenopathy JVP normal no bruits no thyromegaly Lungs clear with no wheezing and good diaphragmatic motion Heart:  S1/S2 no murmur,rub, gallop or click PMI normal Abdomen: benighn, BS positve, no tenderness, no AAA no bruit.  No HSM or HJR Distal pulses intact with no bruits Plus one bilateral  edema Neuro non-focal Skin warm and dry Right ankle fusion Nose:  No deformity, discharge,  or lesions. Mouth:  No deformity or lesions, dentition normal. Lungs:  Clear throughout to auscultation. Heart:  Regular rate and rhythm; no murmurs, rubs,  or bruits. Abdomen:  Soft, nontender and nondistended. No masses, hepatosplenomegaly or hernias noted. Normal bowel sounds. Msk:  R elbow is ok Lumbar-sacral spine is tender to palpation over paraspinal muscles and painfull with the ROM  Neurologic:  Alert and  oriented x4;  grossly normal neurologically. Skin:  no erythema or heat Psych:  Cognition and judgment appear intact. Alert and cooperative with normal attention span and concentration. No apparent delusions, illusions, hallucinations   Impression & Recommendations:  Problem # 1:  ELBOW PAIN (ICD-719.42) resolved Assessment Comment Only  Problem # 2:  CORONARY ARTERY DISEASE (ICD-414.00) Assessment: Unchanged  His updated medication list for this  problem includes:    Isosorbide Mononitrate Cr 30 Mg Tb24 (Isosorbide mononitrate) .Marland Kitchen... 1/2 once daily    Metoprolol Succinate 25 Mg Tb24 (Metoprolol succinate) .Marland Kitchen... 1/2 by mouth qd    Aspirin 325 Mg Tbec (Aspirin) ..... Once daily  Problem # 3:  HYPERTENSION (ICD-401.9) Assessment: Unchanged  His updated medication list for this problem includes:    Metoprolol Succinate 25 Mg Tb24 (Metoprolol succinate) .Marland Kitchen... 1/2 by mouth qd  Problem # 4:  HYPOTHYROIDISM (ICD-244.9) Assessment: Unchanged  His updated medication list for this problem includes:    Levothyroxine Sodium 112 Mcg Tabs (Levothyroxine sodium) .Marland Kitchen... 1 tab by mouth once daily  Problem # 5:  HYPERLIPIDEMIA (ICD-272.4) Assessment: Unchanged  His updated medication list for this problem includes:    Crestor 20 Mg Tabs (Rosuvastatin calcium) .Marland Kitchen... 1 by mouth once daily (every 3rd day)  Complete Medication List: 1)  Isosorbide Mononitrate Cr 30 Mg Tb24 (Isosorbide mononitrate) .... 1/2 once daily 2)  Metoprolol Succinate 25 Mg Tb24 (Metoprolol succinate) .... 1/2 by mouth qd 3)  Vitamin D3 1000 Unit Caps (Cholecalciferol) .Marland Kitchen.. 1 tab by mouth once daily 4)  Aspirin 325 Mg Tbec (Aspirin) .... Once daily 5)  Cobal-1000 1000 Mcg/ml Soln (Cyanocobalamin) .Marland Kitchen.. 1 cc q 2 wks sq 6)  Bd Eclipse Syringe 27g X 1/2" 1 Ml Misc (Syringe/needle (disp)) .... As dirr 7)  Levothyroxine Sodium 112 Mcg Tabs (Levothyroxine sodium) .Marland Kitchen.. 1 tab by mouth once daily 8)  Prilosec Otc 20 Mg Tbec (Omeprazole magnesium) .Marland Kitchen.. 1 once daily 9)  Flonase 50 Mcg/act Susp (Fluticasone propionate) .Marland Kitchen.. 1 spr each nostr qd as needed 10)  Crestor 20 Mg Tabs (Rosuvastatin calcium) .Marland Kitchen.. 1 by mouth once daily (every 3rd day) 11)  Pennsaid 1.5 % Soln (Diclofenac sodium) .... 3-5 gtt on skin three times a day for pain  Patient Instructions: 1)  Please schedule a follow-up appointment in 3 months. 2)  BMP prior to visit, ICD-9:401.1  414.8 3)  Hepatic Panel prior to  visit, ICD-9: 4)  TSH prior to visit, ICD-9:   Orders Added: 1)  Est. Patient Level IV [16109]

## 2010-03-31 NOTE — Assessment & Plan Note (Signed)
Summary: 4 MO ROV /NWS   Vital Signs:  Patient profile:   77 year old male Height:      71 inches Weight:      219.75 pounds BMI:     30.76 O2 Sat:      95 % on Room air Temp:     97.0 degrees F oral Pulse rate:   95 / minute BP sitting:   102 / 60  (left arm) Cuff size:   large  Vitals Entered By: Lucious Groves (June 01, 2009 9:10 AM)  O2 Flow:  Room air CC: 4 mo. rtn ov, no complaints, pt states that he is doing about the same./kb Comments Patient need refill of Metoprolol./kb   Primary Care Provider:  Sula Soda, MD  CC:  4 mo. rtn ov, no complaints, and pt states that he is doing about the same./kb.  History of Present Illness: The patient presents for a follow up of hypertension, CAD, hyperlipidemia   Current Medications (verified): 1)  Isosorbide Mononitrate Cr 30 Mg Tb24 (Isosorbide Mononitrate) .... 1/2 Once Daily 2)  Metoprolol Succinate 25 Mg Tb24 (Metoprolol Succinate) .... 1/2 By Mouth Qd 3)  Vitamin D3 1000 Unit Caps (Cholecalciferol) .Marland Kitchen.. 1 Tab By Mouth Once Daily 4)  Aspirin 325 Mg  Tbec (Aspirin) .... Once Daily 5)  Cobal-1000 1000 Mcg/ml Soln (Cyanocobalamin) .Marland Kitchen.. 1 Cc Q 2 Wks Sq 6)  Bd Eclipse Syringe 27g X 1/2" 1 Ml Misc (Syringe/needle (Disp)) .... As Dirr 7)  Levothyroxine Sodium 112 Mcg Tabs (Levothyroxine Sodium) .Marland Kitchen.. 1 Tab By Mouth Once Daily 8)  Prilosec Otc 20 Mg Tbec (Omeprazole Magnesium) .... 2 By Mouth Qd 9)  Flonase 50 Mcg/act Susp (Fluticasone Propionate) .Marland Kitchen.. 1 Spr Each Nostr Qd As Needed 10)  Crestor 20 Mg Tabs (Rosuvastatin Calcium) .Marland Kitchen.. 1 By Mouth Once Daily (Every 3rd Day)  Allergies (verified): 1)  ! Morphine 2)  Vytorin  Past History:  Past Medical History: Last updated: 10/19/2008 DIVERTICULOSIS, COLON, HX OF (ICD-V12.79) HEMORRHOIDS, HX OF (ICD-V12.79) ALLERGIC RHINITIS, HX OF (ICD-477.9) DEGENERATIVE DISC DISEASE, HX OF (ICD-722.6) NEOPLASM, SKIN, UNCERTAIN BEHAVIOR (ICD-238.2) B12 DEFICIENCY (ICD-266.2) CORONARY  ARTERY DISEASE (ICD-414.00) HYPERLIPIDEMIA (ICD-272.4) HYPOTHYROIDISM (ICD-244.9) HYPERTENSION (ICD-401.9) GERD (ICD-530.81) MUSCLE PAIN (ICD-729.1) OTHER SPECIFIED ACQUIRED HYPOTHYROIDISM (ICD-244.8) SHOULDER PAIN (ICD-719.41) COLONIC POLYPS, HX OF (ICD-V12.72) Melanoma in situ L arm 2010    Past Surgical History: Last updated: 08/26/2008 * RIGHT ANKLE SURGERY,HX OF CORONARY ARTERY BYPASS GRAFT, HX OF (ICD-V45.81) ROTATOR CUFF REPAIR, HX OF BILATERAL (ICD-V45.89)  Family History: Last updated: 08/26/2008 Family History Hypertension No FH of Colon Cancer.  Social History: Last updated: 08/26/2008 Retired 2 girls Married Never Smoked Alcohol Use - no Illicit Drug Use - no Patient gets regular exercise.  Family History: Reviewed history from 08/26/2008 and no changes required. Family History Hypertension No FH of Colon Cancer.  Review of Systems  The patient denies chest pain, syncope, and dyspnea on exertion.    Physical Exam  General:  Affect appropriate Healthy:  appears stated age HEENT: normal Neck supple with no adenopathy JVP normal no bruits no thyromegaly Lungs clear with no wheezing and good diaphragmatic motion Heart:  S1/S2 no murmur,rub, gallop or click PMI normal Abdomen: benighn, BS positve, no tenderness, no AAA no bruit.  No HSM or HJR Distal pulses intact with no bruits Plus one bilateral  edema Neuro non-focal Skin warm and dry Right ankle fusion Nose:  No deformity, discharge,  or lesions. Mouth:  No deformity or lesions, dentition  normal. Lungs:  Clear throughout to auscultation. Heart:  Regular rate and rhythm; no murmurs, rubs,  or bruits. Abdomen:  Soft, nontender and nondistended. No masses, hepatosplenomegaly or hernias noted. Normal bowel sounds. Msk:  Symmetrical with no gross deformities. Normal posture. Neurologic:  Alert and  oriented x4;  grossly normal neurologically. Skin:  Intact without significant lesions or rashes.  Scar on L arm Psych:  Cognition and judgment appear intact. Alert and cooperative with normal attention span and concentration. No apparent delusions, illusions, hallucinations   Impression & Recommendations:  Problem # 1:  CORONARY ARTERY DISEASE (ICD-414.00) Assessment Unchanged  His updated medication list for this problem includes:    Isosorbide Mononitrate Cr 30 Mg Tb24 (Isosorbide mononitrate) .Marland Kitchen... 1/2 once daily    Metoprolol Succinate 25 Mg Tb24 (Metoprolol succinate) .Marland Kitchen... 1/2 by mouth qd    Aspirin 325 Mg Tbec (Aspirin) ..... Once daily  Problem # 2:  B12 DEFICIENCY (ICD-266.2) Assessment: Improved The labs were reviewed with the patient.   Problem # 3:  HYPERTENSION (ICD-401.9) Assessment: Unchanged  His updated medication list for this problem includes:    Metoprolol Succinate 25 Mg Tb24 (Metoprolol succinate) .Marland Kitchen... 1/2 by mouth qd  Problem # 4:  HYPOTHYROIDISM (ICD-244.9) Assessment: Unchanged  His updated medication list for this problem includes:    Levothyroxine Sodium 112 Mcg Tabs (Levothyroxine sodium) .Marland Kitchen... 1 tab by mouth once daily  Complete Medication List: 1)  Isosorbide Mononitrate Cr 30 Mg Tb24 (Isosorbide mononitrate) .... 1/2 once daily 2)  Metoprolol Succinate 25 Mg Tb24 (Metoprolol succinate) .... 1/2 by mouth qd 3)  Vitamin D3 1000 Unit Caps (Cholecalciferol) .Marland Kitchen.. 1 tab by mouth once daily 4)  Aspirin 325 Mg Tbec (Aspirin) .... Once daily 5)  Cobal-1000 1000 Mcg/ml Soln (Cyanocobalamin) .Marland Kitchen.. 1 cc q 2 wks sq 6)  Bd Eclipse Syringe 27g X 1/2" 1 Ml Misc (Syringe/needle (disp)) .... As dirr 7)  Levothyroxine Sodium 112 Mcg Tabs (Levothyroxine sodium) .Marland Kitchen.. 1 tab by mouth once daily 8)  Prilosec Otc 20 Mg Tbec (Omeprazole magnesium) .... 2 by mouth qd 9)  Flonase 50 Mcg/act Susp (Fluticasone propionate) .Marland Kitchen.. 1 spr each nostr qd as needed 10)  Crestor 20 Mg Tabs (Rosuvastatin calcium) .Marland Kitchen.. 1 by mouth once daily (every 3rd day)  Patient  Instructions: 1)  Please schedule a follow-up appointment in 3 months. 2)  BMP prior to visit, ICD-9:401.1 Prescriptions: METOPROLOL SUCCINATE 25 MG TB24 (METOPROLOL SUCCINATE) 1/2 by mouth qd  #90 x 3   Entered and Authorized by:   Tresa Garter MD   Signed by:   Tresa Garter MD on 06/01/2009   Method used:   Electronically to        [facility]* (mail-order)             ,          Ph: [phone]       Fax: [phone]   RxID:   [id number]

## 2010-03-31 NOTE — Progress Notes (Signed)
Summary: Refill--Isosorbide  Phone Note Refill Request Message from:  Fax from Pharmacy on Jul 12, 2009 8:50 AM  Refills Requested: Medication #1:  ISOSORBIDE MONONITRATE CR 30 MG TB24 1/2 once daily Initial call taken by: Lucious Groves,  Jul 12, 2009 8:50 AM    Prescriptions: ISOSORBIDE MONONITRATE CR 30 MG TB24 (ISOSORBIDE MONONITRATE) 1/2 once daily  #90 x 3   Entered by:   Lucious Groves   Authorized by:   Tresa Garter MD   Signed by:   Lucious Groves on 07/12/2009   Method used:   Faxed to ...       [facility]* (mail-order)             ,          Ph: [phone]       Fax: [phone]   RxID:   [id number]

## 2010-03-31 NOTE — Assessment & Plan Note (Signed)
Summary: 4 MO ROV /NWS #   Vital Signs:  Patient profile:   77 year old male Height:      71 inches Weight:      209 pounds BMI:     29.25 O2 Sat:      90 % on Room air Temp:     97.4 degrees F oral Pulse rate:   64 / minute Pulse rhythm:   regular Resp:     16 per minute BP sitting:   108 / 68  (left arm) Cuff size:   regular  Vitals Entered By: Lanier Prude, CMA(AAMA) (October 06, 2009 8:05 AM)  O2 Flow:  Room air CC: 4 mo f/u Is Patient Diabetic? No   Primary Care Provider:  Sula Soda, MD  CC:  4 mo f/u.  History of Present Illness: The patient presents for a follow up of hypertension, CAD, hyperlipidemia C/o R elbow pain w/injury   Current Medications (verified): 1)  Isosorbide Mononitrate Cr 30 Mg Tb24 (Isosorbide Mononitrate) .... 1/2 Once Daily 2)  Metoprolol Succinate 25 Mg Tb24 (Metoprolol Succinate) .... 1/2 By Mouth Qd 3)  Vitamin D3 1000 Unit Caps (Cholecalciferol) .Marland Kitchen.. 1 Tab By Mouth Once Daily 4)  Aspirin 325 Mg  Tbec (Aspirin) .... Once Daily 5)  Cobal-1000 1000 Mcg/ml Soln (Cyanocobalamin) .Marland Kitchen.. 1 Cc Q 2 Wks Sq 6)  Bd Eclipse Syringe 27g X 1/2" 1 Ml Misc (Syringe/needle (Disp)) .... As Dirr 7)  Levothyroxine Sodium 112 Mcg Tabs (Levothyroxine Sodium) .Marland Kitchen.. 1 Tab By Mouth Once Daily 8)  Prilosec Otc 20 Mg Tbec (Omeprazole Magnesium) .Marland Kitchen.. 1 Once Daily 9)  Flonase 50 Mcg/act Susp (Fluticasone Propionate) .Marland Kitchen.. 1 Spr Each Nostr Qd As Needed 10)  Crestor 20 Mg Tabs (Rosuvastatin Calcium) .Marland Kitchen.. 1 By Mouth Once Daily (Every 3rd Day)  Allergies (verified): 1)  ! Morphine 2)  Vytorin  Past History:  Past Medical History: Last updated: 10/19/2008 DIVERTICULOSIS, COLON, HX OF (ICD-V12.79) HEMORRHOIDS, HX OF (ICD-V12.79) ALLERGIC RHINITIS, HX OF (ICD-477.9) DEGENERATIVE DISC DISEASE, HX OF (ICD-722.6) NEOPLASM, SKIN, UNCERTAIN BEHAVIOR (ICD-238.2) B12 DEFICIENCY (ICD-266.2) CORONARY ARTERY DISEASE (ICD-414.00) HYPERLIPIDEMIA  (ICD-272.4) HYPOTHYROIDISM (ICD-244.9) HYPERTENSION (ICD-401.9) GERD (ICD-530.81) MUSCLE PAIN (ICD-729.1) OTHER SPECIFIED ACQUIRED HYPOTHYROIDISM (ICD-244.8) SHOULDER PAIN (ICD-719.41) COLONIC POLYPS, HX OF (ICD-V12.72) Melanoma in situ L arm 2010    Past Surgical History: Last updated: 08/26/2008 * RIGHT ANKLE SURGERY,HX OF CORONARY ARTERY BYPASS GRAFT, HX OF (ICD-V45.81) ROTATOR CUFF REPAIR, HX OF BILATERAL (ICD-V45.89)  Family History: Last updated: 08/26/2008 Family History Hypertension No FH of Colon Cancer.  Social History: Last updated: 08/26/2008 Retired 2 girls Married Never Smoked Alcohol Use - no Illicit Drug Use - no Patient gets regular exercise.  Review of Systems  The patient denies fever, chest pain, syncope, dyspnea on exertion, peripheral edema, and prolonged cough.    Physical Exam  General:  Affect appropriate Healthy:  appears stated age HEENT: normal Neck supple with no adenopathy JVP normal no bruits no thyromegaly Lungs clear with no wheezing and good diaphragmatic motion Heart:  S1/S2 no murmur,rub, gallop or click PMI normal Abdomen: benighn, BS positve, no tenderness, no AAA no bruit.  No HSM or HJR Distal pulses intact with no bruits Plus one bilateral  edema Neuro non-focal Skin warm and dry Right ankle fusion Nose:  No deformity, discharge,  or lesions. Mouth:  No deformity or lesions, dentition normal. Msk:  R med elbow  epicondyl hurts when palpated   Impression & Recommendations:  Problem # 1:  ELBOW PAIN (  EAV-409.81) R medial epicondilitis Assessment New Pennsaid Will inject if not better  Problem # 2:  CORONARY ARTERY DISEASE (ICD-414.00) Assessment: Unchanged  His updated medication list for this problem includes:    Isosorbide Mononitrate Cr 30 Mg Tb24 (Isosorbide mononitrate) .Marland Kitchen... 1/2 once daily    Metoprolol Succinate 25 Mg Tb24 (Metoprolol succinate) .Marland Kitchen... 1/2 by mouth qd    Aspirin 325 Mg Tbec (Aspirin)  ..... Once daily  Problem # 3:  HYPERLIPIDEMIA (ICD-272.4) Assessment: Unchanged  His updated medication list for this problem includes:    Crestor 20 Mg Tabs (Rosuvastatin calcium) .Marland Kitchen... 1 by mouth once daily (every 3rd day)  Labs Reviewed: SGOT: 23 (06/19/2008)   SGPT: 19 (06/19/2008)   HDL:36.60 (05/28/2009), 34.20 (01/28/2009)  LDL:76 (05/28/2009), 113 (19/14/7829)  Chol:133 (05/28/2009), 237 (01/28/2009)  Trig:101.0 (05/28/2009), 108.0 (01/28/2009)  Problem # 4:  HYPERTENSION (ICD-401.9) Assessment: Unchanged  His updated medication list for this problem includes:    Metoprolol Succinate 25 Mg Tb24 (Metoprolol succinate) .Marland Kitchen... 1/2 by mouth qd  BP today: 108/68 Prior BP: 102/60 (06/01/2009)  Labs Reviewed: K+: 3.8 (10/04/2009) Creat: : 0.8 (10/04/2009)   Chol: 133 (05/28/2009)   HDL: 36.60 (05/28/2009)   LDL: 76 (05/28/2009)   TG: 101.0 (05/28/2009)  Problem # 5:  HYPOTHYROIDISM (ICD-244.9) Assessment: Unchanged  His updated medication list for this problem includes:    Levothyroxine Sodium 112 Mcg Tabs (Levothyroxine sodium) .Marland Kitchen... 1 tab by mouth once daily  Labs Reviewed: TSH: 3.58 (01/28/2009)    Chol: 133 (05/28/2009)   HDL: 36.60 (05/28/2009)   LDL: 76 (05/28/2009)   TG: 101.0 (05/28/2009)  Complete Medication List: 1)  Isosorbide Mononitrate Cr 30 Mg Tb24 (Isosorbide mononitrate) .... 1/2 once daily 2)  Metoprolol Succinate 25 Mg Tb24 (Metoprolol succinate) .... 1/2 by mouth qd 3)  Vitamin D3 1000 Unit Caps (Cholecalciferol) .Marland Kitchen.. 1 tab by mouth once daily 4)  Aspirin 325 Mg Tbec (Aspirin) .... Once daily 5)  Cobal-1000 1000 Mcg/ml Soln (Cyanocobalamin) .Marland Kitchen.. 1 cc q 2 wks sq 6)  Bd Eclipse Syringe 27g X 1/2" 1 Ml Misc (Syringe/needle (disp)) .... As dirr 7)  Levothyroxine Sodium 112 Mcg Tabs (Levothyroxine sodium) .Marland Kitchen.. 1 tab by mouth once daily 8)  Prilosec Otc 20 Mg Tbec (Omeprazole magnesium) .Marland Kitchen.. 1 once daily 9)  Flonase 50 Mcg/act Susp (Fluticasone propionate)  .Marland Kitchen.. 1 spr each nostr qd as needed 10)  Crestor 20 Mg Tabs (Rosuvastatin calcium) .Marland Kitchen.. 1 by mouth once daily (every 3rd day) 11)  Pennsaid 1.5 % Soln (Diclofenac sodium) .... 3-5 gtt on skin three times a day for pain  Other Orders: Pneumococcal Vaccine (56213) Admin 1st Vaccine (08657)  Patient Instructions: 1)  Please schedule a follow-up appointment in 3 months. 2)  BMP prior to visit, ICD-9: 3)  Hepatic Panel prior to visit, ICD-9: 4)  Lipid Panel prior to visit, ICD-9:414.8 Prescriptions: PRILOSEC OTC 20 MG TBEC (OMEPRAZOLE MAGNESIUM) 1 once daily  #90 x 3   Entered and Authorized by:   Tresa Garter MD   Signed by:   Tresa Garter MD on 10/06/2009   Method used:   Print then Give to Patient   RxID:   [id number] LEVOTHYROXINE SODIUM 112 MCG TABS (LEVOTHYROXINE SODIUM) 1 tab by mouth once daily  #90 x 3   Entered and Authorized by:   Tresa Garter MD   Signed by:   Tresa Garter MD on 10/06/2009   Method used:   Print then Give to Patient  RxID:   [id number] PENNSAID 1.5 % SOLN (DICLOFENAC SODIUM) 3-5 gtt on skin three times a day for pain  #1 x 3   Entered and Authorized by:   Tresa Garter MD   Signed by:   Tresa Garter MD on 10/06/2009   Method used:   Print then Give to Patient   RxID:   [id number]    Immunizations Administered:  Pneumonia Vaccine:    Vaccine Type: Pneumovax    Site: left deltoid    Mfr: Merck    Dose: 0.5 ml    Route: IM    Given by: Lanier Prude, CMA(AAMA)    Exp. Date: 03/16/2011    Lot #: [id number]    VIS given: 09/25/95 version given October 06, 2009.

## 2010-03-31 NOTE — Assessment & Plan Note (Signed)
Summary: Barry Molina requests cortisone injection elbow/cd   Vital Signs:  Patient profile:   77 year old male Height:      71 inches Weight:      213 pounds BMI:     29.81 O2 Sat:      94 % on Room air Temp:     97.2 degrees F oral Pulse rate:   66 / minute Pulse rhythm:   regular Resp:     16 per minute BP sitting:   128 / 86  (left arm) Cuff size:   regular  Vitals Entered By: Lanier Prude, CMA(AAMA) (November 03, 2009 1:47 PM)  O2 Flow:  Room air  Procedure Note  Injections: The patient complains of inflammation and swelling. Indication: swelling, pain  Procedure # 1: joint aspiration & injection    Region: posterior    Location: R elbow    Technique: 18 g needle    Medication: 20 mg depomedrol    Anesthesia: 1.0 ml 1% lidocaine w/o epinephrine    Comment: Risks including but not limited by incomplete procedure, bleeding, infection, recurrence were discussed with the patient. Consent form was signed. 5 cc of sero-sanguinous liquid was obtained and discarded. Tolerated well. Complicatons - none. Good pain relief following the procedure.   Cleaned and prepped with: alcohol and betadine Wound dressing: bandaid, bulky gauze dressing, and pressure dressing Instructions: daily dressing changes and elevate  CC: right elbow pain Is Patient Diabetic? No   Primary Care Provider:  Sula Soda, MD  CC:  right elbow pain.  History of Present Illness: C/o R elbow pain and swelling x 1 wk  Current Medications (verified): 1)  Isosorbide Mononitrate Cr 30 Mg Tb24 (Isosorbide Mononitrate) .... 1/2 Once Daily 2)  Metoprolol Succinate 25 Mg Tb24 (Metoprolol Succinate) .... 1/2 By Mouth Qd 3)  Vitamin D3 1000 Unit Caps (Cholecalciferol) .Marland Kitchen.. 1 Tab By Mouth Once Daily 4)  Aspirin 325 Mg  Tbec (Aspirin) .... Once Daily 5)  Cobal-1000 1000 Mcg/ml Soln (Cyanocobalamin) .Marland Kitchen.. 1 Cc Q 2 Wks Sq 6)  Bd Eclipse Syringe 27g X 1/2" 1 Ml Misc (Syringe/needle (Disp)) .... As Dirr 7)   Levothyroxine Sodium 112 Mcg Tabs (Levothyroxine Sodium) .Marland Kitchen.. 1 Tab By Mouth Once Daily 8)  Prilosec Otc 20 Mg Tbec (Omeprazole Magnesium) .Marland Kitchen.. 1 Once Daily 9)  Flonase 50 Mcg/act Susp (Fluticasone Propionate) .Marland Kitchen.. 1 Spr Each Nostr Qd As Needed 10)  Crestor 20 Mg Tabs (Rosuvastatin Calcium) .Marland Kitchen.. 1 By Mouth Once Daily (Every 3rd Day) 11)  Pennsaid 1.5 % Soln (Diclofenac Sodium) .... 3-5 Gtt On Skin Three Times A Day For Pain  Allergies (verified): 1)  ! Morphine 2)  Vytorin  Past History:  Past Medical History: Last updated: 10/19/2008 DIVERTICULOSIS, COLON, HX OF (ICD-V12.79) HEMORRHOIDS, HX OF (ICD-V12.79) ALLERGIC RHINITIS, HX OF (ICD-477.9) DEGENERATIVE DISC DISEASE, HX OF (ICD-722.6) NEOPLASM, SKIN, UNCERTAIN BEHAVIOR (ICD-238.2) B12 DEFICIENCY (ICD-266.2) CORONARY ARTERY DISEASE (ICD-414.00) HYPERLIPIDEMIA (ICD-272.4) HYPOTHYROIDISM (ICD-244.9) HYPERTENSION (ICD-401.9) GERD (ICD-530.81) MUSCLE PAIN (ICD-729.1) OTHER SPECIFIED ACQUIRED HYPOTHYROIDISM (ICD-244.8) SHOULDER PAIN (ICD-719.41) COLONIC POLYPS, HX OF (ICD-V12.72) Melanoma in situ L arm 2010    Social History: Last updated: 08/26/2008 Retired 2 girls Married Never Smoked Alcohol Use - no Illicit Drug Use - no Patient gets regular exercise.  Physical Exam  General:  Affect appropriate Healthy:  appears stated age HEENT: normal Neck supple with no adenopathy JVP normal no bruits no thyromegaly Lungs clear with no wheezing and good diaphragmatic motion Heart:  S1/S2 no murmur,rub, gallop  or click PMI normal Abdomen: benighn, BS positve, no tenderness, no AAA no bruit.  No HSM or HJR Distal pulses intact with no bruits Plus one bilateral  edema Neuro non-focal Skin warm and dry Right ankle fusion Mouth:  No deformity or lesions, dentition normal. Lungs:  Clear throughout to auscultation. Heart:  Regular rate and rhythm; no murmurs, rubs,  or bruits. Abdomen:  Soft, nontender and nondistended. No  masses, hepatosplenomegaly or hernias noted. Normal bowel sounds. Msk:  R elbow wilth large swelling, NT Skin:  no erythema or heat   Impression & Recommendations:  Problem # 1:  ELBOW PAIN (ICD-719.42) R - bursitis Assessment New Pennsaid Will aspirate/inject Fluid looked benign and was discarded  Complete Medication List: 1)  Isosorbide Mononitrate Cr 30 Mg Tb24 (Isosorbide mononitrate) .... 1/2 once daily 2)  Metoprolol Succinate 25 Mg Tb24 (Metoprolol succinate) .... 1/2 by mouth qd 3)  Vitamin D3 1000 Unit Caps (Cholecalciferol) .Marland Kitchen.. 1 tab by mouth once daily 4)  Aspirin 325 Mg Tbec (Aspirin) .... Once daily 5)  Cobal-1000 1000 Mcg/ml Soln (Cyanocobalamin) .Marland Kitchen.. 1 cc q 2 wks sq 6)  Bd Eclipse Syringe 27g X 1/2" 1 Ml Misc (Syringe/needle (disp)) .... As dirr 7)  Levothyroxine Sodium 112 Mcg Tabs (Levothyroxine sodium) .Marland Kitchen.. 1 tab by mouth once daily 8)  Prilosec Otc 20 Mg Tbec (Omeprazole magnesium) .Marland Kitchen.. 1 once daily 9)  Flonase 50 Mcg/act Susp (Fluticasone propionate) .Marland Kitchen.. 1 spr each nostr qd as needed 10)  Crestor 20 Mg Tabs (Rosuvastatin calcium) .Marland Kitchen.. 1 by mouth once daily (every 3rd day) 11)  Pennsaid 1.5 % Soln (Diclofenac sodium) .... 3-5 gtt on skin three times a day for pain  Other Orders: Ace Wraps 3-5 in/yard  (U9811) Joint Aspirate / Injection, Intermediate (20605) Depo-Medrol 20mg  (J1020)  Patient Instructions: 1)  Call if you are not better in a reasonable amount of time or if worse.

## 2010-04-07 ENCOUNTER — Other Ambulatory Visit: Payer: Self-pay

## 2010-04-22 ENCOUNTER — Ambulatory Visit: Payer: Self-pay | Admitting: Internal Medicine

## 2010-05-02 ENCOUNTER — Ambulatory Visit: Payer: Self-pay | Admitting: Internal Medicine

## 2010-05-06 ENCOUNTER — Other Ambulatory Visit: Payer: Medicare Other

## 2010-05-06 ENCOUNTER — Other Ambulatory Visit: Payer: Self-pay | Admitting: Internal Medicine

## 2010-05-06 ENCOUNTER — Encounter (INDEPENDENT_AMBULATORY_CARE_PROVIDER_SITE_OTHER): Payer: Self-pay | Admitting: *Deleted

## 2010-05-06 DIAGNOSIS — I2589 Other forms of chronic ischemic heart disease: Secondary | ICD-10-CM

## 2010-05-06 LAB — BASIC METABOLIC PANEL
Calcium: 9 mg/dL (ref 8.4–10.5)
GFR: 89.04 mL/min (ref >60.00)
Potassium: 4.2 mEq/L (ref 3.5–5.1)
Sodium: 142 mEq/L (ref 135–145)

## 2010-05-06 LAB — HEPATIC FUNCTION PANEL
AST: 23 U/L (ref 0–37)
Albumin: 4.1 g/dL (ref 3.5–5.2)
Alkaline Phosphatase: 54 U/L (ref 39–117)
Total Bilirubin: 1.1 mg/dL (ref 0.3–1.2)

## 2010-05-06 LAB — TSH: TSH: 2.98 u[IU]/mL (ref 0.35–5.50)

## 2010-05-10 ENCOUNTER — Encounter: Payer: Self-pay | Admitting: Internal Medicine

## 2010-05-10 ENCOUNTER — Ambulatory Visit (INDEPENDENT_AMBULATORY_CARE_PROVIDER_SITE_OTHER): Payer: Medicare Other | Admitting: Internal Medicine

## 2010-05-10 DIAGNOSIS — M79609 Pain in unspecified limb: Secondary | ICD-10-CM | POA: Insufficient documentation

## 2010-05-10 DIAGNOSIS — E785 Hyperlipidemia, unspecified: Secondary | ICD-10-CM

## 2010-05-10 DIAGNOSIS — I10 Essential (primary) hypertension: Secondary | ICD-10-CM

## 2010-05-10 DIAGNOSIS — E538 Deficiency of other specified B group vitamins: Secondary | ICD-10-CM

## 2010-05-10 HISTORY — DX: Pain in unspecified limb: M79.609

## 2010-05-17 NOTE — Assessment & Plan Note (Signed)
Summary: 3 MO FU/# CD   Vital Signs:  Patient profile:   77 year old male Height:      71 inches Weight:      219 pounds BMI:     30.65 Temp:     97.7 degrees F oral Pulse rate:   68 / minute Pulse rhythm:   regular Resp:     16 per minute BP sitting:   110 / 76  (left arm) Cuff size:   regular  Vitals Entered By: Lanier Prude, CMA(AAMA) (May 10, 2010 10:18 AM) CC: 3 mo f/u Is Patient Diabetic? No   Primary Care Provider:  Sula Soda, MD  CC:  3 mo f/u.  History of Present Illness: The patient presents for a follow up of hypertension, CAD, hyperlipidemia C/o pain in R hamstring x 1 wk when walking only Aleve did not help  Current Medications (verified): 1)  Isosorbide Mononitrate Cr 30 Mg Tb24 (Isosorbide Mononitrate) .... 1/2 Once Daily 2)  Metoprolol Succinate 25 Mg Tb24 (Metoprolol Succinate) .... 1/2 By Mouth Qd 3)  Vitamin D3 1000 Unit Caps (Cholecalciferol) .Marland Kitchen.. 1 Tab By Mouth Once Daily 4)  Aspirin 325 Mg  Tbec (Aspirin) .... Once Daily 5)  Cobal-1000 1000 Mcg/ml Soln (Cyanocobalamin) .Marland Kitchen.. 1 Cc Q 2 Wks Sq 6)  Bd Eclipse Syringe 27g X 1/2" 1 Ml Misc (Syringe/needle (Disp)) .... As Dirr 7)  Levothyroxine Sodium 112 Mcg Tabs (Levothyroxine Sodium) .Marland Kitchen.. 1 Tab By Mouth Once Daily 8)  Prilosec Otc 20 Mg Tbec (Omeprazole Magnesium) .Marland Kitchen.. 1 Once Daily 9)  Flonase 50 Mcg/act Susp (Fluticasone Propionate) .Marland Kitchen.. 1 Spr Each Nostr Qd As Needed 10)  Crestor 20 Mg Tabs (Rosuvastatin Calcium) .Marland Kitchen.. 1 By Mouth Once Daily (Every 3rd Day)  Allergies (verified): 1)  ! Morphine 2)  Vytorin  Past History:  Past Medical History: Last updated: 10/19/2008 DIVERTICULOSIS, COLON, HX OF (ICD-V12.79) HEMORRHOIDS, HX OF (ICD-V12.79) ALLERGIC RHINITIS, HX OF (ICD-477.9) DEGENERATIVE DISC DISEASE, HX OF (ICD-722.6) NEOPLASM, SKIN, UNCERTAIN BEHAVIOR (ICD-238.2) B12 DEFICIENCY (ICD-266.2) CORONARY ARTERY DISEASE (ICD-414.00) HYPERLIPIDEMIA (ICD-272.4) HYPOTHYROIDISM  (ICD-244.9) HYPERTENSION (ICD-401.9) GERD (ICD-530.81) MUSCLE PAIN (ICD-729.1) OTHER SPECIFIED ACQUIRED HYPOTHYROIDISM (ICD-244.8) SHOULDER PAIN (ICD-719.41) COLONIC POLYPS, HX OF (ICD-V12.72) Melanoma in situ L arm 2010    Social History: Last updated: 08/26/2008 Retired 2 girls Married Never Smoked Alcohol Use - no Illicit Drug Use - no Patient gets regular exercise.  Review of Systems  The patient denies anorexia, fever, chest pain, and peripheral edema.    Physical Exam  General:  Affect appropriate Healthy:  appears stated age HEENT: normal Neck supple with no adenopathy JVP normal no bruits no thyromegaly Lungs clear with no wheezing and good diaphragmatic motion Heart:  S1/S2 no murmur,rub, gallop or click PMI normal Abdomen: benighn, BS positve, no tenderness, no AAA no bruit.  No HSM or HJR Distal pulses intact with no bruits Plus one bilateral  edema Neuro non-focal Skin warm and dry Right ankle fusion   Impression & Recommendations:  Problem # 1:  CORONARY ARTERY DISEASE (ICD-414.00) Assessment Unchanged  His updated medication list for this problem includes:    Isosorbide Mononitrate Cr 30 Mg Tb24 (Isosorbide mononitrate) .Marland Kitchen... 1/2 once daily    Metoprolol Succinate 25 Mg Tb24 (Metoprolol succinate) .Marland Kitchen... 1/2 by mouth qd    Aspirin 325 Mg Tbec (Aspirin) ..... Once daily The labs were reviewed with the patient.   Problem # 2:  HYPERTENSION (ICD-401.9) Assessment: Improved  His updated medication list for this problem includes:  Metoprolol Succinate 25 Mg Tb24 (Metoprolol succinate) .Marland Kitchen... 1/2 by mouth qd  BP today: 110/76 Prior BP: 141/78 (03/17/2010)  Labs Reviewed: K+: 4.2 (05/06/2010) Creat: : 0.9 (05/06/2010)   Chol: 155 (01/07/2010)   HDL: 35.00 (01/07/2010)   LDL: 97 (01/07/2010)   TG: 113.0 (01/07/2010)  Problem # 3:  HYPOTHYROIDISM (ICD-244.9) Assessment: Unchanged  His updated medication list for this problem includes:     Levothyroxine Sodium 112 Mcg Tabs (Levothyroxine sodium) .Marland Kitchen... 1 tab by mouth once daily  Problem # 4:  HYPERLIPIDEMIA (ICD-272.4) Assessment: Unchanged  His updated medication list for this problem includes:    Crestor 20 Mg Tabs (Rosuvastatin calcium) .Marland Kitchen... 1 by mouth once daily (every 3rd day)  Labs Reviewed: SGOT: 23 (05/06/2010)   SGPT: 16 (05/06/2010)   HDL:35.00 (01/07/2010), 36.60 (05/28/2009)  LDL:97 (01/07/2010), 76 (05/28/2009)  Chol:155 (01/07/2010), 133 (05/28/2009)  Trig:113.0 (01/07/2010), 101.0 (05/28/2009)  Problem # 5:  LEG PAIN (ICD-729.5) R post thigh - poss strained hamstring Assessment: New Will monitor  Complete Medication List: 1)  Isosorbide Mononitrate Cr 30 Mg Tb24 (Isosorbide mononitrate) .... 1/2 once daily 2)  Metoprolol Succinate 25 Mg Tb24 (Metoprolol succinate) .... 1/2 by mouth qd 3)  Vitamin D3 1000 Unit Caps (Cholecalciferol) .Marland Kitchen.. 1 tab by mouth once daily 4)  Aspirin 325 Mg Tbec (Aspirin) .... Once daily 5)  Cobal-1000 1000 Mcg/ml Soln (Cyanocobalamin) .Marland Kitchen.. 1 cc q 2 wks sq 6)  Bd Eclipse Syringe 27g X 1/2" 1 Ml Misc (Syringe/needle (disp)) .... As dirr 7)  Levothyroxine Sodium 112 Mcg Tabs (Levothyroxine sodium) .Marland Kitchen.. 1 tab by mouth once daily 8)  Prilosec Otc 20 Mg Tbec (Omeprazole magnesium) .Marland Kitchen.. 1 once daily 9)  Flonase 50 Mcg/act Susp (Fluticasone propionate) .Marland Kitchen.. 1 spr each nostr qd as needed 10)  Crestor 20 Mg Tabs (Rosuvastatin calcium) .Marland Kitchen.. 1 by mouth once daily (every 3rd day) 11)  Ibuprofen 400 Mg Tabs (Ibuprofen) .Marland Kitchen.. 1 by mouth two times a day pc prn  Patient Instructions: 1)  Please schedule a follow-up appointment in 3 months. 2)  BMP prior to visit, ICD-9:401.1 Prescriptions: METOPROLOL SUCCINATE 25 MG TB24 (METOPROLOL SUCCINATE) 1/2 by mouth qd  #90 x 3   Entered and Authorized by:   Tresa Garter MD   Signed by:   Tresa Garter MD on 05/10/2010   Method used:   Faxed to ...       [facility] (mail-order)              , [place]         Ph: [phone]       Fax: [phone]   RxID:   [id number] IBUPROFEN 400 MG TABS (IBUPROFEN) 1 by mouth two times a day pc prn  #60 x 3   Entered and Authorized by:   Tresa Garter MD   Signed by:   Tresa Garter MD on 05/10/2010   Method used:   Print then Give to Patient   RxID:   [id number]    Orders Added: 1)  Est. Patient Level IV [25956]

## 2010-07-12 NOTE — Assessment & Plan Note (Signed)
[facility]                            CARDIOLOGY OFFICE NOTE   NAME:Oloughlin, HEMI CHACKO                       MRN:          [id number]  DATE:08/05/2007                            DOB:          1925/10/19    Barry Molina returns today for followup.  He is status post distant CABG with  good LV function.  We did a stress test on him last year, which was  normal with a good EF.  He is not having any significant chest pain,  PND, or orthopnea.  His risk factors include hypercholesterolemia and  hypertension, which are well treated.   He is having problems with right groin pain.  He thinks he may have  pulled a muscle.  There is no obvious history of hip problems.  He has  had the right ankle fused on that side.  He also appears to maybe  developing a bit of a neuropathy.   I have told him to follow up with his primary care MD about this.  This  is Dr. Posey Rea.  He has no circulatory problems, and this is not  claudication.   I did tell him to stop his Vytorin for 4-5 weeks to see if this helps.  He has regular followup and is seen at [facility] quite a  bit.  I also suggested that he see them to make sure there is not an  intrinsic hip problem.   ALLERGIES:  Allergic to MORPHINE.   MEDICATIONS:  1. Prilosec 20 a day.  2. Aspirin a day.  3. Isosorbide 15 a day.  4. Vytorin 10/40.  5. Synthroid 100 mcg a day.  6. Vitamin D.  7. Lopressor 12.5 a day.   REVIEW OF SYSTEMS:  Otherwise negative.   PHYSICAL EXAMINATION:  His weight is 218, blood pressure 121/76, pulse  60 and regular, respiratory rate 14, and afebrile.  Affect appropriate.  HEENT:  Unremarkable.  Carotids are normal without bruit.  No lymphadenopathy, thyromegaly, or  JVP elevation.  LUNGS:  Clear with good diaphragmatic motion.  No wheezing.  S1 and S2 with an aortic sclerosis murmur.  PMI normal.  ABDOMEN:  Benign.  Bowel sounds positive.  No AAA.  No tenderness.  No  bruit.  He has a small ventral hernia.  No hepatosplenomegaly or  hepatojugular reflux.  Distal pulses are intact.  No edema.  NEURO:  Nonfocal.  SKIN:  Warm and dry.  No muscular weakness.  His PTs are +3 bilaterally, and there is no evidence of PVD.   EKG is essentially normal with a first-degree block at 220 milliseconds.   IMPRESSION:  1. Previous bypass surgery, not having chest pain, nonischemic Myoview      last year.  Continue aspirin and beta-blocker.  2. Hypertension, currently well controlled.  Continue low-salt diet      and current dose of beta-blocker and Isordil.  3. Right leg pain and question of neuropathy.  Follow up with Dr.      Posey Rea.  We will stop his Vytorin in 4-5 weeks to see if this  helps.  Also follow up with [facility] in regards to      possible hip problems.  4. First-degree heart block, asymptomatic.  No evidence of high-grade      heart block.  To continue his current dose of metoprolol.  5. Hyperlipidemia.  If his leg symptoms improve off Vytorin, we will      have to try something else, possibly Crestor 5 mg Monday,      Wednesday, and Friday.  He will let me know in a few weeks if his      pain improves and also follow up with [facility] and      Dr. Posey Rea.     Noralyn Pick. Eden Emms, MD, [facility]  Electronically Signed    PCN/MedQ  DD: 08/05/2007  DT: 08/06/2007  Job #: [id number]

## 2010-07-12 NOTE — Assessment & Plan Note (Signed)
[facility]                            CARDIOLOGY OFFICE NOTE   NAME:Molina Molina STIPP                       MRN:          [id number]  DATE:07/30/2006                            DOB:          Jul 15, 1925    Molina Molina returns today for follow-up.  He is status post distant CABG I  believe in 1992.  He has been doing fairly well.  He has not had a  follow-up Myoview study since 2005.   The patient has not had any significant chest pain.   His coronary risk factors include hypertension and hyperlipidemia.   Again, he had some concerns about Vytorin.  I told him that I thought it  was fine for him to stay on this drug.   REVIEW OF SYSTEMS:  Remarkable for some paresthesias, what sound like  neuropathy in his feet, otherwise negative.   MEDICATIONS:  1. Isordil 30 a day.  2. Prilosec 20 a day.  3. An aspirin a day.  4. Vytorin 10/40.  5. Metoprolol 50 a day.  6. Levothyroxine 88 mcg a day.   PHYSICAL EXAMINATION:  GENERAL APPEARANCE:  A healthy-appearing older  white male in no distress.  Affect is jovial.  VITAL SIGNS:  His weight is 225 which is stable.  Blood pressure is  130/70, pulse 54 and regular, respiratory rate 12.  He is afebrile.  HEENT:  Normal.  NECK:  There is no thyromegaly, no lymphadenopathy, no JVP distension,  no carotid bruits.  LUNGS:  Clear with normal diaphragmatic motion.  CARDIOVASCULAR:  There is S1 and S2 with distant heart sounds.  PMI is  not palpable.  ABDOMEN:  Abdomen is benign.  There is no hepatosplenomegaly, no  hepatojugular reflux.  There is a small ventral hernia.  There is no  AAA.  No masses, or organomegaly.  Bowel sounds are positive.  Femorals  are +2 bilaterally with no bruit.  EXTREMITIES:  Distal pulses are intact with no edema.  There is no  evidence of venous insufficiency.  NEUROLOGIC:  Nonfocal.  There is no muscular weakness.   His EKG is essentially normal with sinus bradycardia.   IMPRESSION:  History of distant coronary artery bypass grafting, need  for follow-up Myoview despite not having chest pain.  His activity is  limited by chronic knee pain and arthritis.  It has been approximately  four years since his last stress test.  He will have his adenosine  Myoview scheduled with his wife in October.  He will continue his  current medications including his Vytorin.   In regards to his blood pressure, it is well controlled on the current  regimen of metoprolol and Isordil.  He will have a follow-up liver and  lipid profile in six months. Will continue  his Vytorin for  hypercholesterolemia.   He has hypothyroidism and will continue his current dose of  levothyroxine.  He will follow up with his primary care physician in  regards to checking a TSH.   His reflux is well controlled with Prilosec and he will continue his low  dose at  20 mg.   Overall, I think he is doing well.     Noralyn Pick. Eden Emms, MD, [facility]  Electronically Signed    PCN/MedQ  DD: 07/30/2006  DT: 07/30/2006  Job #: [id number]

## 2010-07-12 NOTE — Assessment & Plan Note (Signed)
[facility]                            CARDIOLOGY OFFICE NOTE   NAME:Woodlief, HEYDEN JABER                       MRN:          [id number]  DATE:01/30/2007                            DOB:          07-Mar-1925    Mr. Tayloe returns today for followup. 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 I also take care of his wife. He had a  distant CABG in 1992. His last Myoview we did November 28, 2006 and it was  essentially normal with some diaphragmatic attenuation and an EF of 55%.  I was thrilled to see this since his bypass grafts are quite old.   His coronary risk factors include hypertension, hyperlipidemia. These  have been well-controlled. If anything, his blood pressure has been a  bit low. He has cut back on his metoprolol to 12.5 mg a day since his  pulse was in the high 40s or low 50s. He has had occasional dizziness,  but nothing marked. We had a discussion about this. His heart rate is  now in the 70s on the lower dose metoprolol, which I think is fine. He  knows to stop his Isordil if he has any severe lightheadedness or  dehydration. From a cardiac perspective, he is otherwise stable. He is  not having any chest pain, PND or orthopnea. His activities are  primarily limited by arthritis in his knees.   REVIEW OF SYSTEMS:  Is remarkable for significant bilateral shoulder  problems. The right shoulder in particular where he had sharp metal  before. It is quite bad with decreased range of motion. Review of  systems otherwise negative.   CURRENT MEDICATIONS:  1. Isordil 15 mg a day.  2. Prilosec 20 a day.  3. Aspirin a day.  4. Vytorin 10/40.  5. Lopressor 12.5 a day.  6. Synthroid 88 mcg a day.   PHYSICAL EXAMINATION:  Is remarkable for an elderly white male in no  distress. His weight is 221. Blood pressure 110/80. He is not postural.  Pulse 72 and regular. Respiratory rate 14. Afebrile.  HEENT: Is unremarkable. Carotids  are normal without bruits. There is no  lymphadenopathy, thyromegaly or JVP elevation.  LUNGS:  Are clear with good diaphragmatic motion. No wheezing.  There is an S1, S2 with normal heart sounds. PMI normal.  ABDOMEN: Is benign. Bowel sounds positive. No AAA. No  hepatosplenomegaly. No hepatojugular reflux. No tenderness and no  bruits.  Distal pulses are intact with no edema.  NEURO: Is nonfocal. No muscular weakness.  SKIN: Is warm and dry.  Significant crepitus and clicking in the right shoulder with extension  and abduction. Status post bilateral arthroscopic shoulder surgeries and  rotator cuff surgery.   IMPRESSION:  1. Stable; distant history of coronary artery bypass graft. No angina.      Continue aspirin and beta-blocker.  2. Hypercholesterolemia. Liver and lipid profile in six months.      Continue current dose of Vytorin. Last LDL checked on December 17, 2006 was 32 with normal LFTs.  3. Previous history of hypertension, but actually quite low at this      time. Would agree with lowering of his Lopressor dose to 12.5 a      day. Resting heart rate much improved in the low 70s now. May need      to stop Isordil in the future. The patient will monitor for      symptoms of postural hypotension. Currently stable.  4. History of gastroesophageal reflux disease. Continue Prilosec 20 mg      a day.  5. Hypothyroidism.  Continue Synthroid 88 mcg a day. TSH and T4 in six      months.     Noralyn Pick. Eden Emms, MD, [facility]  Electronically Signed    PCN/MedQ  DD: 01/30/2007  DT: 01/30/2007  Job #: [id number]

## 2010-07-15 NOTE — Cardiovascular Report (Signed)
[place]  Patient:    Barry Molina, Barry Molina Visit Number: [id number] MRN: [id number]          Service Type: CAT Location: [id number] Attending Physician:  Learta Codding Dictated by:   Lewayne Bunting, M.D. [facility] Proc. Date: 12/31/00 Admit Date:  12/31/2000   CC:         Sonda Primes, M.D. [facility]   Cardiac Catheterization  DATE OF BIRTH: 05-11-1925  REFERRING PHYSICIAN: Sonda Primes, M.D.  CARDIOLOGIST: Lewayne Bunting, M.D.  PROCEDURES PERFORMED: 1. Left heart catheterization with selective coronary angiography. 2. Ventriculography.  DIAGNOSES: 1. Severe multivessel coronary artery disease with diffuse distal disease    of the left anterior descending and circumflex coronary artery. 2. Normal left ventricular systolic function. 3. Markedly elevated left ventricular end-diastolic pressure of 30 mmHg. 4. Hypertension.  INDICATIONS: The patient is a 77 year old male with a history of gastroesophageal reflux disease, as well as history of peptic stricture. The patient had a past medical history of hypertension. He was recently seen by Dr. Posey Rea in the office for symptoms of substernal chest pain and was referred for Cardiolite study. The patient reported substernal chest pain after approximately 4 minutes until exertional associated with significant ST depression by 12-lead electrocardiogram. The stress only Cardiolite images revealed a moderate sized inferoapical perfusion defect. No rest images were obtained. The patient then was seen in consultation by Dr. Andee Lineman and referred for a cardiac catheterization to assess his coronary anatomy. Of note is that the patient reports over the last five months symptoms of substernal chest pain with always an exertion. There is no history of recent rest chest pain. Unusually walking approximately 100 yards up hill will cause him to have severe substernal chest pain requiring him to sit down. Cardiac  risk factors include hypercholesterolemia and hypertension, as well as a family history of coronary artery disease.  DESCRIPTION OF PROCEDURE: After informed consent was obtained, the patient was brought to the catheterization laboratory. The right groin was  sterilely prepped and draped. Lidocaine 1% was infiltrated to provide local anesthesia. Subsequently, a 6 French arterial sheath was placed using the modified Seldinger technique. A 6 [place] preformed JR4 and JL4 catheter was used to engage the left and right ostia respectively. Of note was that the patient had a enlarged aortic arch and a dilated aortic root, but the JL4 catheter was able to selectively cannulate the left main coronary artery. Coronary angiography was then performed in various projections using manual injection of contrast. Subsequently, a 6 French angled pigtail catheter was placed in the left ventricular cavity and appropriate left-sided hemodynamics were obtained. Ventriculography was then performed in a single plane RAO projection using power injection of contrast. At the termination of the procedure all catheters and the arterial sheath were removed and the patient was brought back to the holding area. Adequate hemostasis was provided and no complications were noted.  FINDINGS:  HEMODYNAMICS: Left ventricular pressure 142/30 mmHg, aortic pressure 142/70 mmHg.  VENTRICULOGRAPHY: Ejection fraction 50-55% estimated. No obvious segmental wall motion abnormalities and no mitral regurgitation. Of note, the aortic root appeared to be dilated. There was no aortic gradient on pullback.  SELECTIVE CORONARY ANGIOGRAPHY: 1. Left main coronary was a large caliber vessel with calcification,    particularly in its distal segment but no evidence of flow-limiting    coronary artery disease. 2. Left anterior descending artery was diffusely diseased throughout its    course. There was a focal  stenosis of approximately  40-50% at the    ostium of the LAD. The mid segment of the LAD was diffusely diseased    with heavy calcification. There was a more focal stenosis proximal of    this segment involving the first septal perforator of approximately    80%. Distal part of this long diffusely diseased segment had a more    focal 80% stenosis. The first diagonal arising just proximal of this    area and then 80% ostial stenosis.  The second diagonal arising just distal    of this segment had a 90% focal stenosis. The distal LAD beyond the second    diagonal branch had a focal 80% stenosis. The remainder of the LAD was    diffusely diseased throughout and was a very small vessel. 3. Left circumflex coronary artery appeared to be a large caliber vessel    although it was diffusely diseased again throughout. The proximal    segment had heavy calcification. There was a long proximal lesion of the    circumflex coronary artery of approximately 80%. There was a diminutive    first obtuse marginal branch that the mid circumflex had an 80% focal    stenosis. Just distal beyond this there was another 70% stenosis and just    beyond this there was a very tight 90% stenosis. The remainder of the    distal vessel was again rather small and diffusely diseased. 4. Right coronary artery was a large caliber vessel with diffuse    atherosclerosis in its proximal segment of approximately 30-40%.    The mid segment was diffusely diseased with a more focal 50-60%    lesion. The distal right coronary artery again was diffusely diseased    with a focal 70% stenosis just prior to the takeoff of the posterior    descending artery. The PDA itself in its proximal part had 70% stenosis.    Two posterolateral branches, although small were free of significant    flow-limiting disease.  RECOMMENDATIONS: The results were reviewed with Dr. Veneda Melter. At this point, the options appear to be very limited. The patient has diffuse multivessel  coronary artery disease that is not favorable for percutaneous  coronary intervention. On the other hand, the distal vessels are small and may not be graftable. However, for optimal long-term management, the patient should be evaluated for complete surgical coronary revascularization.  A CVTS consult was obtained. If bypass surgery is not an option, consideration could be given to ECP in conjuction with medical therapy as well as reassessing the possibility of PCI to the circumflex coronary artery, although I suspect this will be unlikely. Dictated by:   Lewayne Bunting, M.D. [facility] Attending Physician:  Learta Codding DD:  12/31/00 TD:  01/01/01 Job: [id number]

## 2010-07-15 NOTE — H&P (Signed)
[facility] ADMISSION   NAME:Consuegra, RYNE MCTIGUE                       MRN:          [id number]  DATE:12/26/2005                            DOB:          1925/05/31    Mr. Ullmer returns today for followup.  He is status post coronary artery  bypass grafting in November of 2002.  He has been doing well without any  significant angina.  He had some excessive bradycardia with heart rates in  the mid-40s.  I cut his Lopressor back to 12.5 b.i.d.  His heart rate is up  into the mid-50s.  He has not had any significant syncope, palpitations,  PND, or orthopnea.   The patient has been compliant with his medications.  He is due to get his  liver and cholesterol checked with Dr. Posey Rea in December.   He had a Myoview in May of 2005 which was low risk with some scar at the  base of the inferolateral wall.   PHYSICAL EXAMINATION:  VITAL SIGNS:  Blood pressure 120/68, pulse 55 and  regular.  SKIN:  Warm and dry.  HEENT:  Normal.  LUNGS:  Clear.  NECK:  Carotids are normal.  There is no thyromegaly.  There is no  lymphadenopathy.  HEART:  There is an S1 and S2 with normal heart sounds.  ABDOMEN:  Benign.  EXTREMITIES:  Lower extremity with intact pulses and no edema.  NEUROLOGIC:  Nonfocal.   IMPRESSION:  Stable coronary disease status post coronary artery bypass  grafting in 2002.  Continue aspirin and beta-blocker therapy, Vytorin for  hypercholesterolemia.  He is to follow up with Dr. Posey Rea for liver  function tests.  I will see him back in about 6 months.  The patient's heart  rate has come up nicely, and we will continue his low-dose beta-blocker.    ______________________________  Noralyn Pick Eden Emms, MD, [facility]    PCN/MedQ  DD: 12/26/2005  DT: 12/26/2005  Job #: [id number]

## 2010-07-15 NOTE — Assessment & Plan Note (Signed)
[facility]                              CARDIOLOGY OFFICE NOTE   NAME:Barry Molina, Barry Molina                       MRN:          [id number]  DATE:11/14/2005                            DOB:          10-08-1925    Jerilynn Som returns for followup.  He is status post bypass in 2002.  He is not  having any significant chest pain.  He had a nonischemic myoview last year.  His risk factors are better controlled.  His LDL is down to 60 now on  Vytorin.   He had some problems with cramps in his legs but these preceded any statin  drug and I do not think they are related.  His heart rate is too low.  His  pulse was in the high 40s and I told him to cut back on his Lopressor to  12.5 once a day.  I will see him back in four to six weeks to see how this  is.   In general, he has been doing very well.  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   PHYSICAL EXAMINATION:  He looks well.  Blood pressure  is 128/70, pulse is  46 and regular.  Lungs are clear.  Carotids normal.  There is an S1, S2 with  normal heart sounds.  Abdomen is benign.  Lower extremities intact pulses,  no edema.   His EKG shows sinus brady at 47.  He is otherwise normal.   IMPRESSION:  Stable.  History of coronary artery disease, status post  distant coronary artery bypass grafting in 2002 on beta blocker therapy with  pulse too low, cut back Lopressor to 12.5 once a day.  Will see in follow-up  in six weeks.  Not having angina, continue aspirin.  Better LDL control on  Vytorin, now at target with an LDL of 65.   Overall I am pleased with his progress and the fact that he is asymptomatic.                               Noralyn Pick. Eden Emms, MD, [facility]    PCN/MedQ  DD:  11/14/2005  DT:  11/15/2005  Job #:  [id number]

## 2010-07-15 NOTE — Op Note (Signed)
[place]  Patient:    Barry Molina, Barry Molina Visit Number: [id number] MRN: [id number]          Service Type: MED Location: 2000 2003 01 Attending Physician:  Cleatrice Burke Dictated by:   Alleen Borne, M.D. Proc. Date: 01/03/01 Admit Date:  12/31/2000   CC:         Lewayne Bunting, M.D. [facility]             [facility]                           Operative Report  PREOPERATIVE DIAGNOSIS:  Severe diffuse three-vessel coronary artery disease.  POSTOPERATIVE DIAGNOSIS:  Severe diffuse three-vessel coronary artery disease.  PROCEDURES:  Median sternotomy, extracorporeal circulation, coronary artery bypass graft surgery x 5 using a left internal mammary artery graft to the left anterior descending coronary artery, with saphenous vein graft to the diagonal branch of the left anterior descending coronary artery, a saphenous vein graft to the obtuse marginal branch of the left circumflex coronary artery, and a sequential saphenous vein graft to the posterior descending and posterolateral branches of the right coronary artery.  SURGEON:  Alleen Borne, M.D.  ASSISTANTTollie Pizza. Collins, P.A.-C.  ANESTHESIA:  General endotracheal.  CLINICAL HISTORY:  This patient is a 77 year old gentleman with a long history of gastroesophageal reflux and peptic stricture, who presents with a five to six-month history of midsubsternal chest pain with exertion.  He recently underwent a stress Cardiolite exam that showed anterior and apical ischemia. The ejection fraction was 55%.  There were electrocardiographic signs of ischemia with 2 mm of ST depression in the inferolateral leads.  He did have some chest pain during the study.  He subsequently underwent cardiac catheterization, which showed severe diffuse coronary artery disease.  The LAD was calcified proximally and had diffuse 50-70% proximal disease.  There was a 90% midvessel  stenosis and an 80% stenosis just beyond that.  The distal portion of the vessel appeared small and somewhat diffusely diseased.  There was a small first diagonal branch that had an 80% ostial stenosis and a second diagonal branch that had 90% ostial stenosis.  The left circumflex had 80% ostial stenosis that extended into the proximal vessel.  There was a small first marginal and then a medium-sized second marginal that was diffusely diseased with multiple 80 and 90% diffuse stenoses.  The only portion that did not appear severely diseased was distally near the end of the vessel.  The right coronary artery had 30-40% proximal, 60% mid-, and 70% distal stenosis just before the takeoff of the posterior descending branch.  The posterior descending branch itself had 70% proximal stenosis.  Left ventricular ejection fraction was about 50-55%.  There was no gradient across the aortic valve. After review of the angiograms and examination of the patient, it was felt that coronary artery bypass graft surgery was the best treatment.  However, it was felt that he had severe diffuse disease and his disease was not ideal for coronary bypass surgery.  However, given the diffuse nature of his disease and the high-grade stenoses, I felt that surgery was his best option since he was not a candidate for percutaneous intervention or medical therapy.  I discussed the operative procedure with him and his wife and family, including alternatives, benefits, and risks, including bleeding, possible blood transfusion, infection, stroke, myocardial infarction, and death.  Also discussed the diffuse nature of his disease and the unpredictability of the patency of his grafts given this disease.  They understood and agreed to proceed.  DESCRIPTION OF PROCEDURE:  The patient was taken to the operating room and placed on the table in supine position.  After induction of general endotracheal anesthesia, a Foley catheter  was placed in the bladder using sterile technique.  Then the chest, abdomen, and both lower extremities were prepped and draped in the usual sterile manner.  The chest was entered through a median sternotomy incision, the pericardium opened in the midline. Examination of the heart showed good ventricular contractility.  The ascending aorta was free of disease.  Then the left internal mammary artery was harvested from the chest wall as a pedicle graft.  This was a large-caliber vessel with excellent blood flow through it.  At the same time a segment of greater saphenous vein was harvested from the left lower leg.  This vein was of medium caliber and good quality.  Then the patient was heparinized and when an adequate activated clotting time was achieved, the distal ascending aorta was cannulated using a 23 French aortic cannula for arterial inflow.  Venous outflow was achieved using a two-stage venous cannula for the right atrial appendage.  An antegrade cardioplegia and vent cannula was inserted in the aortic root.  The patient was placed on cardiopulmonary bypass and the distal coronaries identified.  The LAD was heavily diseased proximally but distally was a medium-sized, graftable vessel that was fairly free of disease.  The second diagonal branch was a small to medium-sized vessel and was suitable for grafting.  The obtuse marginal branch was the most severely diseased vessel, and this extended out into the distal portion of the vessel.  There was a section distally that appeared free of disease and suitable for grafting.  The right coronary artery was also diffusely diseased with plaque.  This extended out into the posterior descending branch to its midportion.  The posterolateral branch was a medium-sized, graftable vessel.  Then the aorta was crossclamped and 1000 cc of cold blood antegrade cardioplegia was administered in the aortic root with quick arrest of the heart.   Systemic hypothermia at 20 degrees and topical hypothermia with iced  saline was used.  A temperature probe was placed in the septum and an insulating pad in the pericardium.  The first distal anastomosis was performed to the diagonal branch.  The internal diameter was about 1.6 mm.  The conduit used was a segment of greater saphenous vein and the anastomosis performed in an end-to-side manner using continuous 7-0 Prolene suture.  Flow was measured through the graft and was excellent.  The second distal anastomosis was performed to the obtuse marginal branch. This was grafted quite distally.  The internal diameter was about 1.25 mm here.  The conduit used was a second segment of greater saphenous vein and the anastomosis performed in an end-to-side manner using continuous 8-0 Prolene suture.  Flow was measured through the graft and was surprisingly good.  Then another dose of cardioplegia was given down the vein grafts and in the aortic root.  The third distal anastomosis was performed to the posterolateral branch of the right coronary artery.  This vessel was grafted just beyond the takeoff of the posterior descending branch.  The internal diameter was 2.5 mm.  The conduit used was a third segment of greater saphenous vein and the anastomosis performed in a sequential side-to-side manner using continuous  7-0 Prolene suture.  Flow was measured through the graft and was excellent.  The fourth distal anastomosis was performed to the distal portion of the posterior descending coronary artery.  The internal diameter was 1.6 mm.  The conduit used was the same segment of greater saphenous vein and the anastomosis performed in a sequential end-to-side manner using continuous 7-0 Prolene suture.  Flow was measured through the graft and was excellent.  Then another dose of cardioplegia was given into the vein grafts and the aortic root.  The fifth distal anastomosis was performed to the  distal portion of the left anterior descending coronary artery.  The internal diameter was 1.6-1.7 mm. The conduit used was the left internal mammary graft, and this was brought through an opening in the left pericardium anterior to the phrenic nerve.  It was anastomosed to the LAD in an end-to-side manner using continuous 8-0 Prolene suture.  The patient was rewarmed to 37 degrees Centigrade.  The clamp was removed from the mammary pedicle.  There was rapid rewarming of the ventricular septum and return of spontaneous ventricular fibrillation.  The mammary pedicle was tacked to the epicardium with 6-0 Prolene suture.  The crossclamp was removed with a time of 83 minutes and the patient spontaneously converted to sinus rhythm.  The partial occlusion clamp was placed on the aortic root, and the three proximal vein graft anastomoses were performed in end-to-side manner using continuous 6-0 Prolene suture.  The clamp was removed, the vein grafts de-aired, and the clamps removed from them.  The proximal and distal anastomoses appeared hemostatic and the alignment of the grafts satisfactory. Graft markers were placed on the proximal anastomoses.  Two temporary right ventricular and right atrial pacing wires were placed and brought through the skin.  When the patient had rewarmed to 37 degrees Centigrade, he was weaned from cardiopulmonary bypass on no inotropic agents.  Total bypass time was 131 minutes.  Cardiac function appeared excellent with a cardiac output of 6-7 L/min.  Protamine was given and the venous and aortic cannulas removed without difficulty.  Hemostasis was achieved.  Three chest tubes were placed with two in the posterior pericardium, one in the left pleural space, and one in the anterior mediastinum.  The pericardium was reapproximated over the heart.  The sternum was closed with #6 stainless steel wire.  The fascia was closed with a continuous #1 Vicryl suture.  The  subcutaneous tissue was closed with continuous 2-0 Vicryl and the skin with 3-0 Vicryl subcuticular closure. The lower extremity vein harvest site was closed in layers in a similar manner.  The sponge, needle, and instrument counts were correct according to the scrub nurse.  Dry sterile dressings were applied over the incisions, around the chest tube, which were hooked to Pleuravac suction.  The patient remained hemodynamically stable and was transported to the SICU in guarded but stable condition. Dictated by:   Alleen Borne, M.D. Attending Physician:  Cleatrice Burke DD:  01/03/01 TD:  01/05/01 Job: [id number]

## 2010-07-15 NOTE — Consult Note (Signed)
[name]  Patient:    Barry Molina, MENTINK Visit Number: [id number] MRN: [id number]          Service Type: MED Location: [id number] Attending Physician:  Learta Codding Dictated by:   Alleen Borne, M.D. Proc. Date: 12/31/00 Admit Date:  12/31/2000   CC:         Sonda Primes, M.D. [facility]  Lewayne Bunting, M.D. [facility]   Consultation Report  REFERRING PHYSICIAN:  Lewayne Bunting, M.D. [facility]  REASON FOR CONSULTATION:  Severe three-vessel coronary artery disease.  HISTORY OF PRESENT ILLNESS:  This patient is a 77 year old gentleman with a long history of gastroesophageal reflux disease and peptic stricture who presents with a five to six-month history of mild substernal chest pain with exertion.  This usually occurs when walking 100 to 200 yards, particularly up hill.  It is associated with some shortness of breath and resolves quickly with rest.  He has also noted a decreased energy level over this period of time.  He recently underwent a stress Cardiolite exam which was positive for anterior apical ischemia.  The ejection fraction was 55%.  There were also electrocardiographic signs of ischemia with 2 mm of ST depression in the inferolateral leads.  He did have chest pain during the study.  He subsequently underwent cardiac catheterization today which shows severe diffuse coronary disease.  The LAD was calcified proximally and had diffuse 50 to 70% proximal disease.  There was a 90% mid vessel stenosis.  There was an 80% stenosis just beyond this.  The distal portion of the vessel was small and appeared diffusely diseased.  There was a small first diagonal branch that had 80% ostial stenosis and a second second diagonal branch that had 90% ostial stenosis.  The left circumflex had 80% ostial stenosis which extended into the proximal vessel.  There was a small first marginal and then a medium size second marginal that was diffusely diseased with an 80 to  90% diffuse stenoses.  The right coronary artery had 30 to 40% proximal, 60% mid, and 70% distal stenosis just before the takeoff of the posterior descending branch. The posterior descending branch itself that 70% proximal stenosis.  Left ventricular ejection fraction was 50 to 55%.  There was no gradient across the aortic valve.  There was elevation of left ventricular end-diastolic pressure to 20 mmHg.  MEDICATIONS PRIOR TO ADMISSION: 1. Prilosec 20 mg q.d. 2. Norvasc 5 mg q.d. 3. Synthroid 0.5 mg q.d.  PAST MEDICAL HISTORY: Significant for hypertension, hypercholesterolemia, and hypothyroidism.  He has a history of gastroesophageal reflux disease with peptic stricture followed by Dr. Yancey Flemings.  He has a history of colon polyps.  He is status post right ankle fusion for degenerative arthritis.  He is also status post bilateral rotator cuff repairs.  REVIEW OF SYSTEMS:  Constitutional: He denies fever or chills.  He has had no recent weight loss.  Eyes: Negative.  ENT: Negative.  Endocrine: He does have hypothyroidism.  He denies diabetes.  Cardiovascular: As above in History of Present Illness.  He denies palpitations.  He has had no PND or orthopnea. Respiratory: He denies cough or sputum production.  GI: He has had no nausea or vomiting.  He does have intermittent heartburn symptoms that are controlled on Prilosec.  He denies melena or bright red blood per rectum.  GU: He denies dysuria and hematuria.  Neurological: He has had no focal weakness or numbness.  He denies dizziness  or syncope.  Psychiatric: Negative. Musculoskeletal: He had degenerative arthritis of his right ankle from an old football injury and is status post right ankle fusion.  Skin: Negative. Allergies: MORPHINE SULFATE.  This causes a rash and nausea and vomiting. Psychiatric: Negative.  FAMILY HISTORY:  His mother died of heart disease.  SOCIAL HISTORY:  He is retired.  He has no history of smoking or  alcohol abuse.  He is married and lives with his wife.  PHYSICAL EXAMINATION:  VITAL SIGNS:  Blood pressure 90/60, pulse 70 and regular, respiratory rate 60 and unlabored.  GENERAL:  He is a well-developed gentleman in no distress.  HEENT:  Normocephalic, atraumatic.  Pupils are equal and reactive to light and accommodation.  Extraocular muscles are intact.  Throat is clear.  NECK:  Normal carotid pulses bilaterally.  There are no bruits.  There is no adenopathy or thyromegaly.  CARDIAC:  Regular rate and rhythm with normal S1 and S2.  There is no murmur, rub, or gallop.  LUNGS:  Clear.  ABDOMEN:  Active bowel sounds.  Abdomen is soft and nontender.  There are no palpable masses or organomegaly.  EXTREMITIES:  No edema.  He has palpable pedal pulses bilaterally.   There are bilateral shoulder scars from rotator cuff surgery and right ankle scar from fusion.  SKIN:  Unremarkable.  NEUROLOGIC:  Alert and oriented x 3.  Motor and sensory exam grossly normal.  IMPRESSION:  Mr. Detwiler has severe diffuse three-vessel coronary artery disease with small distal vessels but normal left ventricular function.  These vessels are not amenable to percutaneous intervention.  I think his long-term results with medical therapy will be poor and most likely result in further ischemia and infarction in the near future.  His vessels are certainly not ideal for coronary artery bypass surgery, although I believe they are graftable to some degree.  I think his best long-term results will probably be with coronary artery bypass graft surgery.  I have discussed the operative procedure with him, his wife, and family including alternatives, benefits, and risks in detail including bleeding, possible blood transfusion, infection, stroke, infarction, and death.  We also discussed the diffuse nature of his coronary artery disease and the  unpredictable results that may be obtained due to this.  They  understand and would like to discuss this further with Dr. Andee Lineman before making the decision about proceeding with surgery. Dictated by:   Alleen Borne, M.D. Attending Physician:  Learta Codding DD:  12/31/00 TD:  01/01/01 Job: [id number]

## 2010-07-15 NOTE — Discharge Summary (Signed)
[place]. [facility]  Patient:    Barry Molina, Barry Molina Visit Number: [id number] MRN: [id number]          Service Type: MED Location: 2000 2003 01 Attending Physician:  Cleatrice Burke Dictated by:   Lissa Merlin, P.A. Admit Date:  12/31/2000 Discharge Date: 01/10/2001   CC:         CVTS office  Sonda Primes, M.D. [facility]  Lewayne Bunting, M.D. [facility]   Discharge Summary  DATE OF BIRTH:  [birth year]  CARDIOLOGIST:  Lewayne Bunting, M.D. [facility]  PRIMARY CARE PHYSICIAN:  Sonda Primes, M.D.  ADMISSION DIAGNOSES: 1. Unstable angina. 2. Positive Cardiolite test.  DISCHARGE DIAGNOSES: 1. Severe three-vessel coronary artery disease. 2. Postoperative urinary retention, resolved. 3. Postoperative O2 dependence, resolved.  PAST MEDICAL HISTORY: 1. (No previous history of coronary artery disease). 2. GERD. 3. Peptic stricture. 4. Hypertension. 5. Hypercholesterolemia. 6. Hypothyroidism. 7. Colonic polyps. 8. Degenerative disk disease with right ankle surgery. 9. Rotator cuff repairs bilaterally.  PROCEDURES: 1. Cardiac catheterization on December 31, 2000 showing severe three vessel    coronary artery disease.  Normal left ventricular function.  Ejection    fraction 50% to 55%. 2. Pre-CABG Dopplers on January 01, 2001 showing no ICA stenosis and ABIs    greater than 1.0 bilaterally. 3. CABG times five on January 03, 2001 with the following grafts:  LIMA to    LAD, saphenous vein graft to OM, saphenous vein graft to first diagonal,    sequential saphenous vein graft from PD to PL.  HISTORY OF PRESENT ILLNESS:  The patient is a 77 year old male with no previous history of CAD.  He does have a history of GERD and peptic stricture. He initially presented with a five to six-month history of substernal chest pain worse with exertion and relieved with rest.  He had a Cardiolite exam which was positive for ischemia.  He was then referred for  cardiac catheterization.  HOSPITAL COURSE:  He came in to [facility] on December 31, 2000 for the cardiac catheterization.  The results showed severe three-vessel coronary artery disease.  He was referred to Dr. Laneta Simmers for coronary artery bypass graft.  After reviewing the films and examining the patient, Dr. Laneta Simmers concluded that CABG was the best treatment.  Risks, benefits, details, and alternatives of the surgery were discussed, and it was agreed to proceed.  He underwent the operation after undergoing routine preop studies on January 03, 2001.  There were no complications.  Postoperatively, he did well with routine care.  He had a little bit of excessive chest tube output, but this stabilized within a short period of time.  He also had mild postoperative urinary retention for which a Foley was replaced.  This eventually resolved, and he is now voiding freely.  He also had some postoperative O2 dependence which prolonged his hospital course by a couple of days.  He continued to work with cardiac rehab and work with his incentive spirometer and made progress with his O2 dependence by January 10, 2001, postoperative day #7.  He is completely off oxygen.  He is 96% on room air.  He is back to his preoperative weight.  Physical exam is satisfactory.  Wounds are healing well.  He is walking well and taking p.o. well.  Bowel and bladder are okay. He is voiding freely.  He is stable and suitable for discharge home and is subsequently discharged.  DISCHARGE MEDICATIONS: 1. Toprol XL 25 mg one p.o.  q.a.m. 2. Enteric-coated aspirin 325 mg one p.o. q.d. 3. Synthroid 0.5 mg one p.o. q.d. 4. Prilosec 20 mg one p.o. q.d. 5. Percocet 5/325 1-2 p.o. q.4-6h. p.r.n.  ALLERGIES:  Morphine causes nausea.  CONDITION ON DISCHARGE:  Stable and improved.  SPECIAL INSTRUCTIONS:  The patient was told to do no heavy lifting or strenuous activity.  No working.  No driving.  He is to walk  daily and use his incentive spirometer daily.  He can shower.  He is to use soap and water on his wounds and to clean his wounds gently daily and to be alert for increasing redness, swelling, drainage, or fever and to call the office if he has any concerns.  He was told to see his cardiologist, Dr. Andee Lineman, in two weeks and to get a chest x-ray and to bring it with him when he sees Dr. Laneta Simmers.  FOLLOW-UP APPOINTMENT: 1. Dr. Andee Lineman two weeks after discharge. 2. Dr. Laneta Simmers three weeks after discharge with a chest x-ray.  Appointments    are being arranged at this time. Dictated by:   Lissa Merlin, P.A.  Attending Physician:  Cleatrice Burke DD:  01/10/01 TD:  01/10/01 Job: [id number]

## 2010-07-15 NOTE — Assessment & Plan Note (Signed)
[facility]                             PULMONARY OFFICE NOTE   NAME:Barry Molina, Barry Molina                       MRN:          [id number]  DATE:06/12/2006                            DOB:          [birth year]     PULMONARY CONSULTATION   HISTORY OF PRESENT ILLNESS:  The patient is a 77 year old gentleman who  I have been asked to see for possible sleep apnea.  The patient states  that his wife has been complaining about his snoring and is concerned  that he may have sleep apnea.  She has noted snoring as well as pauses  in his breathing during sleep.  The patient typically goes to bed around  11 P.M. and gets up at 7:00 A.M. to start his day and feels that he is  very rested upon arising.  He adamantly denies any alertness issues  during periods of inactivity and states that he reads quite a bit during  the day with absolutely no sleepiness.  He has no trouble watching T. V.  or movies and can sit through a whole [employer] Sox baseball game at night  without difficulties.  He has no problems with sleepiness while driving.  His weight of note has been stable over the last two years.   PAST MEDICAL HISTORY:  Significant for:  1. Hypertension.  2. History of dyslipidemia.  3. History of allergic rhinitis.  4. History of coronary disease with CABG in 1992.  5. History of multiple orthopedic procedures.   CURRENT MEDICATIONS:  Include:  1. Isordil 30 mg daily.  2. Prilosec 20 mg daily.  3. Aspirin 325 mg daily.  4. Vytorin 10/40 mg daily.  5. Levothyroxine 88 mcg daily.  6. Metoprolol 50 mg daily.   ALLERGIES:  Patient is INTOLERANT TO MORPHINE.   SOCIAL HISTORY:  He is married and has children.  He has never smoked.   FAMILY HISTORY:  Noncontributory in first degree relatives.   REVIEW OF SYSTEMS:  As per history of present illness.  Also see patient  intake form documented in the chart.   PHYSICAL EXAMINATION:  GENERAL APPEARANCE:  In general, he  is an  overweight white male in no acute distress.  VITAL SIGNS:  Blood pressure 114/76, pulse 59, temperature 97.8, weight  is 225 pounds. He is 5 feet, 11 inches tall.  Oxygen saturation on room  air is 92%.  HEENT:  Pupils equal, round, reactive to light and accommodation.  Extraocular muscle movements are intact. Nares show septal deviation to  the left with narrowing.  Oropharynx does show moderate elongation of  the soft palate and uvula.  NECK:  Supple without jugular venous distention or lymphadenopathy.  There is no palpable thyromegaly.  CHEST:  Totally clear.  CARDIAC:  Exam reveals regular rate and rhythm with no murmurs, rubs or  gallops.  ABDOMEN:  Soft, nontender with good bowel sounds.  GENITOURINARY/RECTAL/BREAST:  Exam's not done, not indicated.  EXTREMITIES:  Lower extremities are without significant edema.  Pulses  are intact distally.  NEUROLOGICAL:  Alert and oriented with no obvious motor  deficits.   IMPRESSION:  Snoring without an overly impressive history to support the  diagnosis of obstructive sleep apnea.  The patient sleeps well at night,  is rested in the mornings whenever he arises, has a low eppworth score,  and feels he has no daytime alertness issues, even with reading.  I  certainly can't exclude that he may have mild obstructive sleep apnea,  but this is not really a significant health issue for him.  I do think  that he needs to work on weight loss and that his snoring will improve  to some degree, as will any mild obstructive sleep apnea that is  present.  The larger question is whether or not his wife is going to be  satisfied with him continuing to snore and I have asked him to try to  stay off his back as much as possible.  Also, if it continues to be an  issue, he could consider nasal septal reconstruction and UP3.   PLAN:  1. The patient is to work on weight loss and also to stay off his back      as much as possible.  2. He is to call me  if he has any changes in his alertness issues or      if he feels that he is not sleeping as well so that we can schedule      a sleep study.  3. The patient will follow up on a p.r.n. basis.     Barbaraann Share, MD,FCCP  Electronically Signed    [facility]/MedQ  DD: 06/12/2006  DT: 06/12/2006  Job #: [id number]   cc:   Georgina Quint. Plotnikov, MD

## 2010-07-15 NOTE — Cardiovascular Report (Signed)
[place]. [facility]  Patient:    Barry Molina, Barry Molina Visit Number: [id number] MRN: [id number]          Service Type: CAT Location: 3700 3714 01 Attending Physician:  Learta Codding Dictated by:   Lewayne Bunting, M.D. [facility] Proc. Date: 12/31/00 Admit Date:  12/31/2000                          Cardiac Catheterization  NO DICTATION Dictated by:   Lewayne Bunting, M.D. [facility] Attending Physician:  Learta Codding DD:  12/31/00 TD:  01/01/01 Job: [id number]

## 2010-08-11 ENCOUNTER — Other Ambulatory Visit: Payer: Self-pay | Admitting: Internal Medicine

## 2010-08-11 ENCOUNTER — Other Ambulatory Visit: Payer: Medicare Other

## 2010-08-11 DIAGNOSIS — I10 Essential (primary) hypertension: Secondary | ICD-10-CM

## 2010-08-12 ENCOUNTER — Encounter: Payer: Self-pay | Admitting: Internal Medicine

## 2010-08-12 ENCOUNTER — Other Ambulatory Visit (INDEPENDENT_AMBULATORY_CARE_PROVIDER_SITE_OTHER): Payer: Medicare Other

## 2010-08-12 DIAGNOSIS — I10 Essential (primary) hypertension: Secondary | ICD-10-CM

## 2010-08-12 LAB — BASIC METABOLIC PANEL
BUN: 22 mg/dL (ref 6–23)
CO2: 28 mEq/L (ref 19–32)
Chloride: 105 mEq/L (ref 96–112)
Creatinine, Ser: 0.8 mg/dL (ref 0.4–1.5)

## 2010-08-15 ENCOUNTER — Ambulatory Visit (INDEPENDENT_AMBULATORY_CARE_PROVIDER_SITE_OTHER): Payer: Medicare Other | Admitting: Internal Medicine

## 2010-08-15 ENCOUNTER — Encounter: Payer: Self-pay | Admitting: Internal Medicine

## 2010-08-15 VITALS — BP 140/80 | HR 72 | Temp 98.6°F | Resp 16 | Ht 71.0 in | Wt 214.0 lb

## 2010-08-15 DIAGNOSIS — E538 Deficiency of other specified B group vitamins: Secondary | ICD-10-CM

## 2010-08-15 DIAGNOSIS — E039 Hypothyroidism, unspecified: Secondary | ICD-10-CM

## 2010-08-15 DIAGNOSIS — E785 Hyperlipidemia, unspecified: Secondary | ICD-10-CM

## 2010-08-15 DIAGNOSIS — R609 Edema, unspecified: Secondary | ICD-10-CM

## 2010-08-15 MED ORDER — LEVOTHYROXINE SODIUM 112 MCG PO TABS: 112.0000 ug | ORAL_TABLET | Freq: Every day | ORAL | Status: DC

## 2010-08-15 NOTE — Progress Notes (Signed)
  Subjective:    Patient ID: Barry Molina, male    DOB: 1925/09/01, 77 y.o.   MRN: [id number]  HPI  The patient presents for a follow-up of  chronic hypertension, chronic dyslipidemia, CADcontrolled with medicines C/o R post thigh pain - he saw Dr Thomasena Edis and was told it was his back. He had an inject by Dr Ethelene Hal  - helped x 2 wks    Review of Systems  Constitutional: Negative for appetite change, fatigue and unexpected weight change.  HENT: Negative for nosebleeds, congestion, sore throat, sneezing, trouble swallowing and neck pain.   Eyes: Negative for itching and visual disturbance.  Respiratory: Negative for cough.   Cardiovascular: Negative for chest pain, palpitations and leg swelling.  Gastrointestinal: Negative for nausea, diarrhea, blood in stool and abdominal distention.  Genitourinary: Negative for frequency and hematuria.  Musculoskeletal: Positive for back pain and gait problem. Negative for joint swelling.  Skin: Negative for rash.  Neurological: Negative for dizziness, tremors, speech difficulty and weakness.  Psychiatric/Behavioral: Negative for sleep disturbance, dysphoric mood and agitation. The patient is not nervous/anxious.        Objective:   Physical Exam  Constitutional: He is oriented to person, place, and time. He appears well-developed.  HENT:  Mouth/Throat: Oropharynx is clear and moist.  Eyes: Conjunctivae are normal. Pupils are equal, round, and reactive to light.  Neck: Normal range of motion. No JVD present. No thyromegaly present.  Cardiovascular: Normal rate, regular rhythm, normal heart sounds and intact distal pulses.  Exam reveals no gallop and no friction rub.   No murmur heard. Pulmonary/Chest: Effort normal and breath sounds normal. No respiratory distress. He has no wheezes. He has no rales. He exhibits no tenderness.  Abdominal: Soft. Bowel sounds are normal. He exhibits no distension and no mass. There is no tenderness. There is no  rebound and no guarding.  Musculoskeletal: Normal range of motion. He exhibits no edema. Tenderness: R post thigh hurts.  Lymphadenopathy:    He has no cervical adenopathy.  Neurological: He is alert and oriented to person, place, and time. He has normal reflexes. No cranial nerve deficit. He exhibits normal muscle tone. Coordination normal.  Skin: Skin is warm and dry. No rash noted. No erythema.  Psychiatric: He has a normal mood and affect. His behavior is normal. Judgment and thought content normal.        Lab Results  Component Value Date   CHOL 155 01/07/2010   TRIG 113.0 01/07/2010   HDL 35.00* 01/07/2010   LDLDIRECT 189.0 01/28/2009   ALT 16 05/06/2010   AST 23 05/06/2010   NA 140 08/12/2010   K 4.0 08/12/2010   CL 105 08/12/2010   CREATININE 0.8 08/12/2010   BUN 22 08/12/2010   CO2 28 08/12/2010   TSH 2.98 05/06/2010   PSA 2.38 04/08/2007     Assessment & Plan:

## 2010-08-16 ENCOUNTER — Encounter: Payer: Self-pay | Admitting: Internal Medicine

## 2010-08-16 NOTE — Assessment & Plan Note (Signed)
Cont Rx 

## 2010-08-16 NOTE — Assessment & Plan Note (Signed)
On Rx 

## 2010-09-23 ENCOUNTER — Encounter (HOSPITAL_COMMUNITY)
Admission: RE | Admit: 2010-09-23 | Discharge: 2010-09-23 | Disposition: A | Discharge status: Home / Self Care | Payer: Medicare Other | Source: Ambulatory Visit | Attending: Neurosurgery | Admitting: Neurosurgery

## 2010-09-23 ENCOUNTER — Ambulatory Visit (HOSPITAL_COMMUNITY)
Admission: RE | Admit: 2010-09-23 | Discharge: 2010-09-23 | Disposition: A | Discharge status: Home / Self Care | Payer: Medicare Other | Source: Ambulatory Visit | Attending: Neurosurgery | Admitting: Neurosurgery

## 2010-09-23 ENCOUNTER — Telehealth: Payer: Self-pay | Admitting: Cardiovascular Disease

## 2010-09-23 ENCOUNTER — Other Ambulatory Visit (HOSPITAL_COMMUNITY): Payer: Self-pay | Admitting: Neurosurgery

## 2010-09-23 DIAGNOSIS — M47814 Spondylosis without myelopathy or radiculopathy, thoracic region: Secondary | ICD-10-CM | POA: Insufficient documentation

## 2010-09-23 DIAGNOSIS — Z01818 Encounter for other preprocedural examination: Secondary | ICD-10-CM | POA: Insufficient documentation

## 2010-09-23 DIAGNOSIS — R52 Pain, unspecified: Secondary | ICD-10-CM

## 2010-09-23 DIAGNOSIS — Z01812 Encounter for preprocedural laboratory examination: Secondary | ICD-10-CM | POA: Insufficient documentation

## 2010-09-23 LAB — CBC
HCT: 40.5 % (ref 39.0–52.0)
Hemoglobin: 13.9 g/dL (ref 13.0–17.0)
MCHC: 34.3 g/dL (ref 30.0–36.0)
RDW: 12.9 % (ref 11.5–15.5)
WBC: 6.1 10*3/uL (ref 4.0–10.5)

## 2010-09-23 LAB — BASIC METABOLIC PANEL
BUN: 21 mg/dL (ref 6–23)
Chloride: 108 mEq/L (ref 96–112)
Creatinine, Ser: 0.97 mg/dL (ref 0.50–1.35)
GFR calc Af Amer: >60 mL/min (ref >60)
GFR calc non Af Amer: >60 mL/min (ref >60)
Glucose, Bld: 105 mg/dL — ABNORMAL HIGH (ref 70–99)
Potassium: 4 mEq/L (ref 3.5–5.1)

## 2010-09-23 NOTE — Telephone Encounter (Signed)
Faxed LOV, EKG & Stress to Barry Molina at [facility] ([phone]).

## 2010-09-27 ENCOUNTER — Ambulatory Visit (HOSPITAL_COMMUNITY): Payer: Medicare Other

## 2010-09-27 ENCOUNTER — Inpatient Hospital Stay (HOSPITAL_COMMUNITY)
Admission: RE | Admit: 2010-09-27 | Discharge: 2010-09-30 | DRG: 491 | Disposition: A | Discharge status: Home Health | Payer: Medicare Other | Source: Ambulatory Visit | Attending: Neurosurgery | Admitting: Neurosurgery

## 2010-09-27 DIAGNOSIS — M7130 Other bursal cyst, unspecified site: Secondary | ICD-10-CM | POA: Diagnosis present

## 2010-09-27 DIAGNOSIS — Z951 Presence of aortocoronary bypass graft: Secondary | ICD-10-CM

## 2010-09-27 DIAGNOSIS — I2510 Atherosclerotic heart disease of native coronary artery without angina pectoris: Secondary | ICD-10-CM | POA: Diagnosis present

## 2010-09-27 DIAGNOSIS — M48062 Spinal stenosis, lumbar region with neurogenic claudication: Principal | ICD-10-CM | POA: Diagnosis present

## 2010-09-27 HISTORY — PX: BACK SURGERY: SHX140

## 2010-09-28 HISTORY — PX: SPINE SURGERY: SHX786

## 2010-10-04 NOTE — Op Note (Signed)
  NAMERENDER, MARLEY NO.:  [id number]  MEDICAL RECORD NO.:  [id number]  LOCATION:  3036                         FACILITY:  [facility]  PHYSICIAN:  Hilda Lias, M.D.   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  PROCEDURES:  Bilateral 4-5 laminectomy, partial L3, removal of a large calcified synovial cyst dorsolateral to the right, microscope.  SURGEON:  Hilda Lias, MD.  ASSISTANT:  Stefani Dama, MD.  CLINICAL HISTORY:  The patient was admitted because of back pain, radiating to both legs with shooting pain going mostly down to right leg.  The patient by x-ray he had severe stenosis at the level of 4-5 and borderline between 3-4.  Surgery was advised.  DESCRIPTION OF PROCEDURE:  The patient was taken to the OR and after intubation, he was positioned in prone manner.  The back was cleaned with DuraPrep.  Drapes were applied.  Midline incision was made through the skin and subcutaneous tissue.  Muscles were retracted laterally.  X- rays showed __________ 3 and 4.  Then, dissection was carried down lateral to the facet.  Then with Leksell, we removed the spinous process of 5-4 and partial of 3 with drilling our way, removing the lamina.  For that, we had to use the microscope with the drill.  At the level of 3-4, we were able to do a laminotomy of L3, removal of a thick calcified ligament mostly on the right side.  The most difficult part was at the level L4-5 where we found that this gentleman had a large dorsolateral synovial cyst going to the left, but mostly on the right side. Dissection using the 1-mm Kerrison punch had to be done as well with the drill.  At the end, we had good decompression of the  thecal sac.  The cyst was approximately about 2 x 1 cm.  It was calcified.  The foraminotomy was accomplished to open the foramen for entry at L4, L5, S1 space.  Having good decompression, fentanyl and Depo-Medrol were left in the pleural space and the wound was closed with Vicryl and staples.          ______________________________ Hilda Lias, M.D.     EB/MEDQ  D:  09/27/2010  T:  09/28/2010  Job:  [id number]  Electronically Signed by Hilda Lias M.D. on 10/04/2010 01:13:44 PM

## 2010-10-11 ENCOUNTER — Other Ambulatory Visit: Payer: Self-pay | Admitting: *Deleted

## 2010-10-11 ENCOUNTER — Other Ambulatory Visit: Payer: Self-pay | Admitting: Internal Medicine

## 2010-10-11 MED ORDER — ROSUVASTATIN CALCIUM 20 MG PO TABS: 20.0000 mg | ORAL_TABLET | Freq: Every day | ORAL | Status: DC

## 2010-10-17 ENCOUNTER — Other Ambulatory Visit: Payer: Self-pay | Admitting: Internal Medicine

## 2010-10-29 HISTORY — PX: CHOLECYSTECTOMY: SHX55

## 2010-11-01 ENCOUNTER — Emergency Department (HOSPITAL_COMMUNITY): Payer: Medicare Other

## 2010-11-01 ENCOUNTER — Inpatient Hospital Stay (HOSPITAL_COMMUNITY)
Admission: EM | Admit: 2010-11-01 | Discharge: 2010-11-05 | DRG: 419 | Disposition: A | Discharge status: Home / Self Care | Payer: Medicare Other | Attending: Family Medicine | Admitting: Family Medicine

## 2010-11-01 DIAGNOSIS — R109 Unspecified abdominal pain: Secondary | ICD-10-CM

## 2010-11-01 DIAGNOSIS — Z951 Presence of aortocoronary bypass graft: Secondary | ICD-10-CM

## 2010-11-01 DIAGNOSIS — Z7982 Long term (current) use of aspirin: Secondary | ICD-10-CM

## 2010-11-01 DIAGNOSIS — I2510 Atherosclerotic heart disease of native coronary artery without angina pectoris: Secondary | ICD-10-CM | POA: Diagnosis present

## 2010-11-01 DIAGNOSIS — E785 Hyperlipidemia, unspecified: Secondary | ICD-10-CM | POA: Diagnosis present

## 2010-11-01 DIAGNOSIS — K449 Diaphragmatic hernia without obstruction or gangrene: Secondary | ICD-10-CM | POA: Diagnosis present

## 2010-11-01 DIAGNOSIS — I517 Cardiomegaly: Secondary | ICD-10-CM | POA: Diagnosis present

## 2010-11-01 DIAGNOSIS — M1990 Unspecified osteoarthritis, unspecified site: Secondary | ICD-10-CM | POA: Diagnosis present

## 2010-11-01 DIAGNOSIS — M5137 Other intervertebral disc degeneration, lumbosacral region: Secondary | ICD-10-CM | POA: Diagnosis present

## 2010-11-01 DIAGNOSIS — Z79899 Other long term (current) drug therapy: Secondary | ICD-10-CM

## 2010-11-01 DIAGNOSIS — K8060 Calculus of gallbladder and bile duct with cholecystitis, unspecified, without obstruction: Principal | ICD-10-CM | POA: Diagnosis present

## 2010-11-01 DIAGNOSIS — I440 Atrioventricular block, first degree: Secondary | ICD-10-CM | POA: Diagnosis present

## 2010-11-01 DIAGNOSIS — K8064 Calculus of gallbladder and bile duct with chronic cholecystitis without obstruction: Principal | ICD-10-CM | POA: Diagnosis present

## 2010-11-01 DIAGNOSIS — R6881 Early satiety: Secondary | ICD-10-CM

## 2010-11-01 DIAGNOSIS — I10 Essential (primary) hypertension: Secondary | ICD-10-CM | POA: Diagnosis present

## 2010-11-01 DIAGNOSIS — Z860100 Personal history of colon polyps, unspecified: Secondary | ICD-10-CM

## 2010-11-01 DIAGNOSIS — E039 Hypothyroidism, unspecified: Secondary | ICD-10-CM | POA: Diagnosis present

## 2010-11-01 DIAGNOSIS — M51379 Other intervertebral disc degeneration, lumbosacral region without mention of lumbar back pain or lower extremity pain: Secondary | ICD-10-CM | POA: Diagnosis present

## 2010-11-01 LAB — DIFFERENTIAL
Basophils Relative: 1 % (ref 0–1)
Lymphs Abs: 1.6 10*3/uL (ref 0.7–4.0)
Monocytes Absolute: 0.5 10*3/uL (ref 0.1–1.0)
Monocytes Relative: 8 % (ref 3–12)
Neutro Abs: 3.6 10*3/uL (ref 1.7–7.7)
Neutrophils Relative %: 60 % (ref 43–77)

## 2010-11-01 LAB — CBC
Hemoglobin: 13.7 g/dL (ref 13.0–17.0)
MCH: 32.5 pg (ref 26.0–34.0)
MCHC: 34.2 g/dL (ref 30.0–36.0)
MCV: 95 fL (ref 78.0–100.0)
Platelets: 169 10*3/uL (ref 150–400)
RBC: 4.22 MIL/uL (ref 4.22–5.81)
RDW: 13.2 % (ref 11.5–15.5)

## 2010-11-01 LAB — COMPREHENSIVE METABOLIC PANEL
BUN: 21 mg/dL (ref 6–23)
Calcium: 9.9 mg/dL (ref 8.4–10.5)
Creatinine, Ser: 0.74 mg/dL (ref 0.50–1.35)
GFR calc Af Amer: >60 mL/min (ref >60)
Glucose, Bld: 124 mg/dL — ABNORMAL HIGH (ref 70–99)
Total Protein: 6.5 g/dL (ref 6.0–8.3)

## 2010-11-01 LAB — CARDIAC PANEL(CRET KIN+CKTOT+MB+TROPI)
CK, MB: 3.3 ng/mL (ref 0.3–4.0)
Relative Index: INVALID (ref 0.0–2.5)
Troponin I: <0.3 ng/mL (ref <0.30)

## 2010-11-01 LAB — LIPASE, BLOOD: Lipase: 32 U/L (ref 11–59)

## 2010-11-01 LAB — CK TOTAL AND CKMB (NOT AT ARMC): Total CK: 99 U/L (ref 7–232)

## 2010-11-01 LAB — POCT I-STAT TROPONIN I: Troponin i, poc: 0.04 ng/mL (ref 0.00–0.08)

## 2010-11-01 MED ORDER — IOHEXOL 350 MG/ML SOLN: 100.0000 mL | Freq: Once | INTRAVENOUS | Status: AC | PRN

## 2010-11-02 ENCOUNTER — Inpatient Hospital Stay (HOSPITAL_COMMUNITY): Payer: Medicare Other

## 2010-11-02 DIAGNOSIS — R6881 Early satiety: Secondary | ICD-10-CM

## 2010-11-02 DIAGNOSIS — K838 Other specified diseases of biliary tract: Secondary | ICD-10-CM

## 2010-11-02 DIAGNOSIS — R109 Unspecified abdominal pain: Secondary | ICD-10-CM

## 2010-11-02 DIAGNOSIS — K8010 Calculus of gallbladder with chronic cholecystitis without obstruction: Secondary | ICD-10-CM

## 2010-11-02 LAB — URINALYSIS, ROUTINE W REFLEX MICROSCOPIC
Glucose, UA: NEGATIVE mg/dL
Leukocytes, UA: NEGATIVE
Nitrite: NEGATIVE
Protein, ur: NEGATIVE mg/dL
pH: 5.5 (ref 5.0–8.0)

## 2010-11-02 MED ORDER — TECHNETIUM TC 99M MEBROFENIN IV KIT
5.0000 | PACK | Freq: Once | INTRAVENOUS | Status: AC | PRN
  Administered 2010-11-02: 5 via INTRAVENOUS

## 2010-11-02 NOTE — H&P (Signed)
NAMEHURSHEL, Barry Molina                ACCOUNT NO.:  [id number]  MEDICAL RECORD NO.:  [id number]  LOCATION:  [facility]                         FACILITY:  [facility]  PHYSICIAN:  Ladell Pier, M.D.   DATE OF BIRTH:  [birth year]  DATE OF ADMISSION:  11/01/2010 DATE OF DISCHARGE:                             HISTORY & PHYSICAL   CHIEF COMPLAINT:  Abdominal pain.  HISTORY OF PRESENT ILLNESS:  The patient is an 77 year old white male with past medical history significant for hypertension, coronary artery disease, hyperlipidemia.  The patient stated that about 10 p.m. last night, he started having abdominal pain.  He had dinner about 6 p.m. and sometime between 6 p.m. and 10, he was feeling a little bit like indigestion, so he took some Pepto-Bismol.  Then by 10 p.m., he developed severe abdominal pain that progressively got worse.  He took 3 nitroglycerin tablets without any relief.  He did not have any chest pain.  No shortness of breath.  No nausea, no vomiting, no diarrhea.  He has had some constipation recently.  PAST MEDICAL HISTORY:  Significant for: 1. Coronary artery disease status post CABG. 2. History of first-degree AV block. 3. Hypertension. 4. Hyperlipidemia. 5. Back surgery on September 28, 2010. 6. History of rotator cuff repair. 7. History of ankle surgery. 8. Hypothyroidism.  FAMILY HISTORY:  Noncontributory.  Both parents are deceased.  Mother died of a stroke at 35.  Father died of old age at 6.  SOCIAL HISTORY:  The patient is married.  He does not smoke or drink alcohol.  He has 2 children.  He is a retired Engineer, site.  MEDICATIONS:  The patient is not quite sure of the doses, but he takes: 1. Isosorbide mononitrate 30 mg half a tablet daily. 2. Toprol-XL 25 mg half a tablet daily. 3. Aspirin 325 mg daily. 4. Synthroid 112 mcg daily. 5. Prilosec 20 mg daily. 6. Crestor 20 mg every third day. 7. Vitamin D 1000 International Units daily. 8. Flonase nasal spray as  needed.  ALLERGIES:  MORPHINE.  REVIEW OF SYSTEMS:  Negative otherwise stated in HPI.  PHYSICAL EXAMINATION:  VITAL SIGNS:  Temperature 98.0, pulse of 58, respirations 18, blood pressure 168/95, pulse ox 99% on room air. GENERAL:  The patient is laying on stretcher, well-nourished white male. HEENT:  Head is normocephalic, atraumatic.  Pupils are reactive to light.  Throat is without erythema. CARDIOVASCULAR:  Slightly bradycardic. LUNGS:  Clear bilaterally.  No wheezes, rhonchi, or rales. ABDOMEN:  Soft, mildly tender in the periumbilical area.  Positive bowel sounds. EXTREMITIES:  He does have trace edema on the right per the patient that is chronic from an ankle surgery in the past.  LABORATORY DATA:  CT of the abdomen showed small infrarenal abdominal aortic aneurysm, cholelithiasis, bilateral pars defect at L5, sigmoid diverticulosis with equivocal active diverticulitis, posteriorly near calcification along the bladder mucosa, cannot rule out calcified bladder tumor, although this could represent a tiny bladder calculi. Cystoscopy could be used as a further characterization.  Enlarged prostate, right heart enlargement which may reflect elevated right heart pressures.  CK 99, MB 4.2, lipase of 32.  Sodium 143, potassium 3.5, chloride 108,  CO2 of 25, glucose 124, BUN 21, creatinine 0.74.  AST 17, ALT 9.  WBC 5.9, hemoglobin 13.7, MCV 95, platelet 169.  ASSESSMENT AND PLAN: 1. Abdominal pain, question of diverticulitis, question of bladder     pathology. 2. Coronary artery disease. 3. Hypertension. 4. Dyslipidemia.  The patient will be admitted to the hospital.  He did have cholelithiasis on the CAT scan, so we will get a HIDA scan to see if he has any common bile duct obstruction, although his liver function is normal.  We will get a lactic acid level, although this does not seem like ischemia.  We will start him on Cipro and Flagyl with a question of diverticulitis, but  he does not have a white count.  We will make him n.p.o. except for ice chips.  Did consult GI.  We will continue him on his home medication and put him on Protonix PPI q.12 h. and p.r.n. pain medications.  Discussed with the family, following up outpatient with Urology for cystoscopy with a question of the bladder pathology.  His EKG does show new right bundle compared to the EKG.  He has done on September 23, 2010.  He does not have any chest pain, but we will get cardiologist take a look at this EKG to make sure there is nothing to do with this EKG.  Does have a history of first-degree AV block which is shown on the EKG on September 23, 2010.  So, we will consult Cardiology either officially or unofficially to take a look at this EKG to make sure there is no further workup as stated inpatient. His cardiologist is [facility].  Time spent with the patient in doing this admission is approximately 1 hour.     Ladell Pier, M.D.     [facility]/MEDQ  D:  11/01/2010  T:  11/01/2010  Job:  [id number]  cc:   Georgina Quint. Plotnikov, MD Noralyn Pick. Eden Emms, MD, [facility]  Electronically Signed by Ladell Pier M.D. on 11/02/2010 10:36:17 PM

## 2010-11-03 ENCOUNTER — Other Ambulatory Visit (INDEPENDENT_AMBULATORY_CARE_PROVIDER_SITE_OTHER): Payer: Self-pay | Admitting: General Surgery

## 2010-11-03 ENCOUNTER — Inpatient Hospital Stay (HOSPITAL_COMMUNITY): Payer: Medicare Other

## 2010-11-03 LAB — SURGICAL PCR SCREEN
MRSA, PCR: NEGATIVE
Staphylococcus aureus: NEGATIVE

## 2010-11-03 LAB — APTT: aPTT: 36 s (ref 24–37)

## 2010-11-03 NOTE — Discharge Summary (Signed)
  NAMETRACER, GUTRIDGE NO.:  [id number]  MEDICAL RECORD NO.:  [id number]  LOCATION:  3036                         FACILITY:  [facility]  PHYSICIAN:  Stefani Dama, M.D.  DATE OF BIRTH:  [birth year]  DATE OF ADMISSION:  09/27/2010 DATE OF DISCHARGE:                              DISCHARGE SUMMARY   ADMITTING DIAGNOSIS:  Lumbar stenosis L3-4, L4-5 with neurogenic claudication.  DISCHARGE DIAGNOSIS:  Lumbar stenosis L3-4, L4-5 with neurogenic claudication.  OPERATIONS AND PROCEDURES:  Bilateral L4-5 laminectomy, partial L3 laminectomy, removal of a large calcified synovial cyst all done under the microscope by Dr. Jeral Fruit.  BRIEF HISTORY AND HOSPITAL COURSE:  The patient is an 77 year old gentleman who was admitted due to back pain and bilateral lower extremity radiculopathy which was intolerable.  Imaging studies revealed that he had a critical severe stenosis at the L4-5 level and borderline stenosis at the L3-4 level.  He was advised for surgical decompression. He underwent decompression on September 27, 2010, tolerated this procedure well, stabilized in recovery room and placed on 3000 regular floor.  He was placed on some morphine IV for pain control and p.o. Percocet.  He did not tolerate Percocet very well, but was switched to Roxicodone and this controlled his pain.  He did have an episode of urinary retention, had to be I and O cath and then ultimately a Foley had to be replaced. After 24 hours of bladder rest, the Foley was discontinued.  He was placed on Flomax and he was able to void on his own.  He also had a bowel movement prior to discharge home, ready for discharge home, ambulating safely, eating well, voiding well on September 30, 2010.  Vital signs stable.  His wound was benign.  There is no erythema, drainage and sinus infection.  He is neurovascularly intact.  He had no new focal deficits.  DISCHARGE CONDITION:  Stable and improved.  DISCHARGE  INSTRUCTIONS:  Discharged home, back precautions, ambulate as tolerated.  DISCHARGE MEDICATIONS: 1. Metoprolol 25 mg one half tablet p.o. daily. 2. Flonase 2 sprays nasally daily. 3. Prilosec 20 mg p.o. daily. 4. Levothyroxine 112 mcg p.o. daily. 5. Vitamin B12 injection twice a month. 6. Aspirin 325 mg 1 p.o. daily. 7. Crestor 20 mg 1 p.o. every 3 days. 8. Oxycodone 10 mg 1 p.o. q.4-6 h. p.r.n. pain. 9. Vitamin D3 1000 units daily p.o.  Contact our office prior to follow up if there are any question or concerns.  Call our office for a followup, a postoperative appointment in 2-3 weeks with Dr. Jeral Fruit.     Aura Fey [facility].   ______________________________ Stefani Dama, M.D.    [facility]I/MEDQ  D:  09/30/2010  T:  09/30/2010  Job:  [id number]  Electronically Signed by Orlin Hilding P.A. on 10/24/2010 12:04:29 PM Electronically Signed by Barnett Abu M.D. on 11/03/2010 03:14:50 PM

## 2010-11-03 NOTE — Op Note (Signed)
Barry Molina, Barry Molina                ACCOUNT NO.:  [id number]  MEDICAL RECORD NO.:  [id number]  LOCATION:  4712                         FACILITY:  [facility]  PHYSICIAN:  Adolph Pollack, M.D.DATE OF BIRTH:  [birth year]  DATE OF PROCEDURE:  11/03/2010 DATE OF DISCHARGE:                              OPERATIVE REPORT   PREOPERATIVE DIAGNOSES:  Symptomatic cholelithiasis with chronic cholecystitis.  POSTOPERATIVE DIAGNOSES:  Symptomatic cholelithiasis with chronic cholecystitis with choledocholithiasis.  PROCEDURE:  Laparoscopic cholecystectomy with intraoperative cholangiogram and transcystic common bile duct exploration with stone extraction.  SURGEON:  Adolph Pollack, MD.  ASSISTANT:  Eber Hong, PA.  ANESTHESIA:  General.  INDICATIONS:  This is an 77 year old male who came in with epigastric and right upper quadrant pain, specifically postprandially.  His evaluations include a normal white cell count, normal liver function tests.  He did have a CT scan demonstrating gallstones.  EGD was normal for acute pathology.  HIDA scan showed nonvisualization of the gallbladder.  Upon trying to eat, he gets recurrent biliary colic.  He now presents for a laparoscopic cholecystectomy with cholangiogram. Procedure at risks (risks include but on limited to bleeding, infection, wound healing problems, anesthesia, bile duct injury, bile leak, injury to the intestines or liver, post cholecystectomy, diarrhea) were discussed with him preoperatively.  He knows he is a slightly higher risk for bleeding given in his use of aspirin.  TECHNIQUE:  He was brought to the operating room, placed supine on the operating table and general anesthetic was administered.  Hair on the abdominal wall was clipped and the area sterilely prepped and draped. Marcaine was infiltrated in the subumbilical region.  A small subumbilical incision was made through the skin and subcutaneous tissue. The  fascia was elevated and incision was made in the fascia and peritoneum, entering the peritoneal cavity under direct vision.  A pursestring suture of 0 Vicryl was placed around the fascial edges.  A Hasson trocar was introduced into the peritoneal cavity.  A pneumoperitoneum was created by insufflation of CO2 gas.  Next, laparoscope was introduced.  There was no underlying organ injury or bleeding.  He was placed in a reverse Trendelenburg position with a right side tilted slightly up.  A 5-mm trocar was placed in the epigastric incision and two 5-mm trocar was placed in the right upper quadrant.  The gallbladder was tense and distended with some mild-to- moderate inflammatory changes.  I then needle aspirated the white bile from the gallbladder as well as some brownish fluid, decompressed the gallbladder.  The fundus of the gallbladder was then grasped and retracted to the right shoulder.  Using blunt dissection, I mobilized the infundibulum by dissection close to the gallbladder and retracted it laterally.  I subsequently was able to expose the cystic duct and the cystic artery.  I created windows around both of these.  The cystic artery was clipped and divided.  The critical view was achieved.  A clip was then placed in the cystic duct gallbladder junction.  A small incision was made in the cystic duct.  Cholangiocatheter was passed through the anterior abdominal wall, placed into the cystic duct, and the cholangiogram was performed.  Under real time fluoroscopy, dilute contrast was injected to the cystic duct.  There appeared to be a filling defect distally, it was not completely obstructed.  I subsequently removed the cholangiocatheter and replaced it with a Reddick catheter with a balloon.  This was placed into the cystic duct. 1 mg of glucagon was given IV.  I then performed a transcystic common bile duct exploration using the balloon on the Reddick catheter.  I swept the duct  and removed a stone.  I swept it four other times. Second and third time I removed small stone fragments.  The fourth time nothing was removed.  Saline was flushed through easily.  A completion of cholangiogram demonstrated filling defect to be resolved and this was confirmed with the radiologist.  Following this, a cholangiocath was removed, cystic duct was clipped three times in the biliary side and divided.  The gallbladder was then dissected free from liver intact and placed in Endopouch bag.  The Endopouch bag was then removed through the subumbilical incision and the subumbilical port was replaced.  The gallbladder fossa was copiously irrigated.  Bleeding was controlled with electrocautery.  Further inspection demonstrated no active bleeding and no bile leak.  A piece of Surgicel was then placed in the gallbladder fossa.  Irrigation fluid was evacuated as much as possible.  I then inspected the left lower quadrant and inspected the sigmoid colon and noted no acute inflammatory changes and no evidence of acute diverticulitis.  The subumbilical trocar was removed.  The subumbilical fascial defect was closed by tightening up and tying down the pursestring suture. Remaining trocars removed and pneumoperitoneum was released.  The skin incisions were closed with 4-0 Monocryl subcuticular stitches. Steri-Strips and sterile dressing were applied.  He tolerated the procedure well without any apparent complications and was taken to the recovery in satisfactory condition.     Adolph Pollack, M.D.     Kari Baars  D:  11/03/2010  T:  11/03/2010  Job:  [id number]  cc:   Georgina Quint. Plotnikov, MD  Electronically Signed by Avel Peace M.D. on 11/03/2010 04:56:02 PM

## 2010-11-04 LAB — CBC
MCH: 32.6 pg (ref 26.0–34.0)
MCHC: 33.6 g/dL (ref 30.0–36.0)
Platelets: 179 10*3/uL (ref 150–400)
RDW: 13.8 % (ref 11.5–15.5)

## 2010-11-04 LAB — COMPREHENSIVE METABOLIC PANEL
ALT: 19 U/L (ref 0–53)
Calcium: 9 mg/dL (ref 8.4–10.5)
GFR calc Af Amer: >60 mL/min (ref >60)
Glucose, Bld: 119 mg/dL — ABNORMAL HIGH (ref 70–99)
Sodium: 141 mEq/L (ref 135–145)
Total Protein: 6.4 g/dL (ref 6.0–8.3)

## 2010-11-05 LAB — CBC
HCT: 36.2 % — ABNORMAL LOW (ref 39.0–52.0)
Hemoglobin: 12.5 g/dL — ABNORMAL LOW (ref 13.0–17.0)
MCHC: 34.5 g/dL (ref 30.0–36.0)

## 2010-11-07 LAB — CROSSMATCH
ABO/RH(D): O NEG
Unit division: 0

## 2010-11-12 NOTE — Discharge Summary (Signed)
Barry Molina, Barry Molina                ACCOUNT NO.:  [id number]  MEDICAL RECORD NO.:  [id number]  LOCATION:  4712                         FACILITY:  [facility]  PHYSICIAN:  Pleas Koch, MD        DATE OF BIRTH:  1926/02/22  DATE OF ADMISSION:  11/01/2010 DATE OF DISCHARGE:  11/05/2010                              DISCHARGE SUMMARY   DISCHARGE DIAGNOSES: 1. Abdominal pain later found to be in hospital to be secondary to     symptomatic cholelithiasis.  Postoperative findings were chronic     cholecystitis with choledocholithiasis. 2. History of possible diverticulitis per CT scan. 3. Hypertension, moderate controlled, in hospital. 4. Coronary artery disease status post coronary artery disease bypass     graft for three-vessel disease in 2002, currently now changed from     aspirin 325 to 81 mg. 5. Lumbar degenerative disk disease with surgery on September 28, 2010,     and currently stable. 6. Hyperlipidemia. 7. Prominent prostate on CT scan - prostate-specific antigen held, an     outpatient Urology appointment if needed. 8. Possible right heart enlargement noted on CT scan of chest, again     outpatient followup with Dr. Posey Rea for further workup.  Discharge medications are as follows: 1. Flonase 2 sprays daily p.r.n. 2. Crestor 20 mg 1 tablet q.72 hourly. 3. Isosorbide mononitrate 15 mg of XR form daily. 4. Oxycodone/APAP 5/325 one tablet q.6 p.r.n., quantity sufficient for     7 days, 28 tablets supplied. 5. Prilosec 20 mg 1 cap daily. 6. Levothyroxine 112 mcg 1 tablet daily. 7. Vitamin B12, 1000 mcg per injection twice monthly. 8. Vitamin D3, 1000 units 1 tablet daily.  Please note, dosage changed     from metoprolol succinate 25 tablet 0.5 tablets to 1 tablet daily     as well as change in aspirin dosage from 325 to 81 mg daily.  Pertinent imaging studies done include: 1. Chest x-ray portable on November 01, 2010, showing one elevation in     left hemidiaphragm.     a.      Emphysematous changes in lung.     b.     No focal airspace consolidation. 2. CT angiogram abdomen showed:     a.     Small infrarenal abdominal aortic aneurysm.     b.     Cholelithiasis.     c.     Bilateral pars defects at L5, with grade 2 anterolisthesis      and postop findings L3 through L5.     d.     Sigmoid diverticulosis with equivocal active diverticulitis.     e.     Posteriorly no calcification along bladder mucosa.  Cannot      exclude sessile small calcified bladder tumor.  All this could      represent tiny bladder calculi.  Cystoscopy could be used for      further characterization.     f.     Prominent prostate gland.     g.     Right heart atrial enlargement which may reflect elevated      right heart pressure. 3. HIDA scan  done on November 02, 2010, showing nonvisualization of     gallbladder 4 hours, worrisome for acute cholecystitis.     a.     Intraoperative cholangiogram done on November 03, 2010,      showed initial distal choledocholithiasis, did not persist on the      post-stent removal runs.  Satisfactory intraoperative      cholangiogram appearance on the final stages.  HOSPITAL COURSE:  Barry Molina is a very pleasant 77 year old male with multiple medical illnesses, who presented around 10 p.m. on October 31, 2010, with abdominal pain which felt like indigestion and he took some Pepto-Bismol.  At around 10 p.m., he developed some severe abdominal pain that got progressively worse, took 3 nitroglycerin tablets without any relief, did not have any chest pain, shortness of breath, nausea, vomiting, diarrhea, and has had some constipation recently.  Pertinent positives per HPI, review of the history of first-degree AV block, history of CAD plus CABG x3.  He has never had any abdominal surgery in the past.  VITALS ON ADMISSION:  Temperature 98, pulse 58, respirations 18, blood pressure 168/95.  PERTINENT POSITIVES:  He was slightly bradycardic.  He was  mildly tender in the periumbilical area.  No positive bowel sounds.  He did have trace edema as well.  Lab data which is not included in the above showed that sodium is 143, UJW:JXBJYNWGNF ratio is 21:0.75.  LFTs were within normal limits.  He had WBC of 5.9 and CK of 99, MB of 4.2.  HOSPITAL COURSE ACCORDING TO ISSUE: 1. Abdominal pain.  The patient had a CT scan that suggested     diverticulitis and initially, Gastroenterology was consulted with     regards to the same.  He had only mild tenderness and a non-     elevated lipase on this admission and a HIDA scan was ordered by     Gastroenterology that ultimately showed cholecystitis as a     differential.  He also underwent EGD per Gastroenterology that     showed completely negative for disease.  The HIDA scan came back conclusive for cholecystitis and General Surgery, Dr. Abbey Chatters was consulted.  The patient was kept n.p.o. initially, but when he was given food, he did develop symptomatic right upper quadrant pain and a semiemergent cholecystectomy was then performed with intraoperative cholangiogram as per above.  The patient is currently day #2 status post lap chole and is doing great.  He still has some mild abdominal pain, but he has not passed flatus or stool currently despite having a soft diet.  General Surgery currently is managing this issue, but if he is able to pass flatus or stool, I imagine the patient would be able to be discharged later on today with close followup with Dr. Abbey Chatters for postop check may be in 10 days. 1. Hypertension.  His hypertension proved slightly difficult to     control, likely secondary to abdominal pain from acute surgery.  I     increased his Toprol-XL transiently during hospitalization.  On     telemetry, he has had no pauses and actually was slightly     tachycardic with sinus tachycardia, likely secondary to pain.  I     will keep his Imdur at 15 mg and he can follow up with  the     outpatient physician Dr. Posey Rea for further issues. 2. Status post CABG, three-vessel disease 2002.  This is stable.  He     will  continue his beta-blocker.  He is not on an ACE and this may     be recommended as an outpatient if his blood pressure can tolerate.     He is also on aspirin 325 mg which could cause gastric irritation     and there is no benefit to this over lower dosages of aspirin for     secondary prevention.  Hence, I have taken the liberty of lowering     his dose to 81 mg. 3. Hyperlipidemia.  This is a stable problem and he will need     secondary prevention and dietary counseling as an outpatient. 4. Possible diverticulitis on CT scan.  This is equivocal as CT scans     usually over-read for diverticulitis.  However, the patient does     experience any diarrhea.  Certainly, he can follow up with primary     care physician. 5. Enlarged prostate on CT abdomen.  The patient will need an     outpatient evaluation of the same.  I have held off on doing a PSA     in this patient as current USPSTF guidelines do not support the     same. 6. ? Right-sided heart failure.  He does have an enlarged heart on     review of chest x-ray as well as CT scan done of the abdomen.  He     is currently euvolemic and I have not placed him on any diuretics     and he will be followed up as an outpatient for the same.  The patient was doing well on morning of review.  Vitals; 98.3, pulse 100, respirations 18, blood pressure 114/64, sats about 92-94 on room air.  The patient was encouraged to use incentive spirometry and ambulate.  He had no nausea, vomiting, or shortness of breath and had not passed flatus or stool.  He ate breakfast this morning consisting of peaches, half a muffin, and [place] toast and really wishes to go home.  PHYSICAL EXAMINATION:  ABDOMEN:  Slightly tender in the right upper quadrant.  I do appreciate bowel sounds.  There is no rebound or guarding other  than voluntary guarding of the right upper quadrant from the surgery scars. CHEST:  Clinically clear with may be a little bit of decrease in breath sounds in the lower bases posteriorly. HEART:  S1, S2.  Murmur in the left upper sternal edge, 3/6. EXTREMITIES:  Soft. HEENT:  He has no pallor, no icterus.  Throat is clinically clear.  The patient is fit for discharge once she does pass flatus and when reviewed by General Surgery.  He will follow up per General Surgery's discussion likely in about 1-2 weeks for postop followup and he is requested to make an appointment with Dr. Posey Rea for general followup.  It was a pleasure taking care of this patient.          ______________________________ Pleas Koch, MD     JS/MEDQ  D:  11/05/2010  T:  11/05/2010  Job:  [id number]  cc:   Georgina Quint. Plotnikov, MD Adolph Pollack, M.D. Wilhemina Bonito. Marina Goodell, MD  Electronically Signed by Pleas Koch MD on 11/12/2010 04:54:09 PM

## 2010-11-16 ENCOUNTER — Ambulatory Visit (INDEPENDENT_AMBULATORY_CARE_PROVIDER_SITE_OTHER): Payer: Medicare Other | Admitting: Internal Medicine

## 2010-11-16 ENCOUNTER — Encounter: Payer: Self-pay | Admitting: Internal Medicine

## 2010-11-16 DIAGNOSIS — R110 Nausea: Secondary | ICD-10-CM | POA: Insufficient documentation

## 2010-11-16 DIAGNOSIS — R197 Diarrhea, unspecified: Secondary | ICD-10-CM

## 2010-11-16 DIAGNOSIS — R634 Abnormal weight loss: Secondary | ICD-10-CM | POA: Insufficient documentation

## 2010-11-16 NOTE — Assessment & Plan Note (Signed)
See patient instructions

## 2010-11-16 NOTE — Patient Instructions (Signed)
Stop Crestor x 2 weeks Align 1 a day x 1 wk Nexium in place of Prilosec 1 a day Welchol 1 twice a day if diarrhea

## 2010-11-16 NOTE — Progress Notes (Signed)
  Subjective:    Patient ID: Barry Molina, male    DOB: 05-14-1925, 77 y.o.   MRN: [id number]  HPI  C/o nausea, diarrhea and wt loss of 20 lbs post GB surgery 11/01/10 No LBP post back surgery  Review of Systems  Constitutional: Positive for appetite change, fatigue and unexpected weight change. Negative for fever.  HENT: Negative for nosebleeds, congestion, sore throat, sneezing, trouble swallowing and neck pain.   Eyes: Negative for itching and visual disturbance.  Respiratory: Negative for cough and chest tightness.   Cardiovascular: Negative for chest pain, palpitations and leg swelling.  Gastrointestinal: Positive for nausea and diarrhea. Negative for blood in stool and abdominal distention.  Genitourinary: Negative for frequency, hematuria and flank pain.  Musculoskeletal: Negative for back pain, joint swelling and gait problem.  Skin: Negative for rash.  Neurological: Negative for dizziness, tremors, speech difficulty and weakness.  Psychiatric/Behavioral: Negative for sleep disturbance, dysphoric mood and agitation. The patient is not nervous/anxious.        Objective:   Physical Exam  Constitutional: He is oriented to person, place, and time. He appears well-developed.  HENT:  Mouth/Throat: Oropharynx is clear and moist.  Eyes: Conjunctivae are normal. Pupils are equal, round, and reactive to light.  Neck: Normal range of motion. No JVD present. No thyromegaly present.  Cardiovascular: Normal rate, regular rhythm, normal heart sounds and intact distal pulses.  Exam reveals no gallop and no friction rub.   No murmur heard. Pulmonary/Chest: Effort normal and breath sounds normal. No respiratory distress. He has no wheezes. He has no rales. He exhibits no tenderness.  Abdominal: Soft. Bowel sounds are normal. He exhibits no distension and no mass. There is no tenderness. There is no rebound and no guarding.  Musculoskeletal: Normal range of motion. He exhibits no edema and no  tenderness.  Lymphadenopathy:    He has no cervical adenopathy.  Neurological: He is alert and oriented to person, place, and time. He has normal reflexes. No cranial nerve deficit. He exhibits normal muscle tone. Coordination normal.  Skin: Skin is warm and dry. No rash noted.  Psychiatric: He has a normal mood and affect. His behavior is normal. Judgment and thought content normal.    Wt Readings from Last 3 Encounters:  11/16/10 191 lb (86.637 kg)  08/15/10 214 lb (97.07 kg)  05/10/10 219 lb (99.338 kg)         Assessment & Plan:

## 2010-11-17 NOTE — Consult Note (Signed)
Barry Molina, Barry Molina NO.:  [id number]  MEDICAL RECORD NO.:  [id number]  LOCATION:  4712                         FACILITY:  [facility]  PHYSICIAN:  Adolph Pollack, M.D.DATE OF BIRTH:  August 11, 1925  DATE OF CONSULTATION: DATE OF DISCHARGE:                                CONSULTATION   ADDENDUM  After the patient was admitted, Gastroenterology did evaluate the patient.  He had an upper endoscopy yesterday which was negative for any disease.  He then had a HIDA scan which revealed a nonvisualization of the gallbladder today.  The patient has a normal white blood cell count and no elevation of his liver function tests.  He has been placed on Cipro and Flagyl for some possible mild diverticulitis seen on CT scan, which the patient is asymptomatic from.  Currently, the patient is without any abdominal pain.  Due to his diagnostic findings, we have been asked to evaluate the patient for further recommendations.  REVIEW OF SYSTEMS:  Please see HPI.  Otherwise, the patient does admit to bruising and bleeding easily on aspirin.  Otherwise, all other systems have been reviewed and are negative.  PAST MEDICAL HISTORY: 1. Hypertension. 2. Hyperlipidemia. 3. Coronary artery disease.  PAST SURGICAL HISTORY: 1. Bilateral rotator cuff repair. 2. History of ankle surgery. 3. Back surgery earlier in August 2012.  SOCIAL HISTORY:  The patient is married with 2 children.  His wife and his daughter are currently present.  He denies any alcohol, tobacco, or illicit drug abuse.  ALLERGIES:  MORPHINE causes nausea.  MEDICATIONS AT HOME: 1. Toprol-XL 25 mg 1/2 tablet daily. 2. Isosorbide mononitrate 30 mg 1/2 tablet daily. 3. Vitamin D3 1000 units daily. 4. Vitamin B12 1000 mcg twice a month. 5. Prilosec 20 mg a day. 6. Levothyroxine 112 mcg daily. 7. Flonase 2 sprays daily as needed. 8. Crestor 20 mg every 3 days. 9. Aspirin 325 mg daily.  PHYSICAL EXAMINATION:   GENERAL:  Barry Molina is a very pleasant well- developed, well-nourished 77 year old white male who is currently lying in bed, in no acute distress. VITAL SIGNS:  Temperature 98.2, pulse 68, respirations 22, blood pressure 114/72. HEENT:  Head is normocephalic, atraumatic.  Sclerae noninjected.  Pupils are equal, round, and reactive to light.  Ears and nose are without any obvious masses or lesions.  No rhinorrhea.  Mouth is pink.  Throat shows no exudate. HEART:  Regular rate and rhythm.  Normal S1 and S2.  No murmurs, gallops, or rubs are noted.  He does have palpable carotid, radial, and pedal pulses bilaterally. LUNGS:  Clear to auscultation bilaterally with no wheezes, rhonchi, or rales noted.  Respiratory effort is nonlabored. ABDOMEN:  Soft, nontender, and nondistended with active bowel sounds. No Eulah Pont sign is noted.  He has no masses, hernias, or organomegaly noted. MUSCULOSKELETAL:  All 4 extremities are symmetrical with no cyanosis, clubbing, or edema. SKIN:  Warm and dry with no masses, lesions, or rashes. PSYCHIATRIC:  The patient is alert and oriented x3 with an appropriate affect.  LABS AND DIAGNOSTICS:  Cardiac panel is negative.  Lipase is 32.  Sodium 143, potassium 3.5, glucose 124, BUN 21, creatinine 0.74, total  bilirubin 0.5, alkaline phosphatase 68, AST 17, ALT 9.  White blood cell count is 5900, hemoglobin 13.7, hematocrit 40.1, platelet count 169,000. Diagnostic hepatobiliary scan shows nonvisualization of the gallbladder at 4 hours which is worrisome for acute cholecystitis.  CT scan of the abdomen and pelvis reveals layering cholelithiasis, no evidence of cholecystitis.  He also has sigmoid diverticulosis with some faint mesenteric stranding which is equivocal for active diverticulitis.  IMPRESSION: 1. Symptomatic cholelithiasis, possible chronic cholecystitis with a     positive HIDA scan. 2. Coronary artery disease. 3. Hypertension.  PLAN:  The patient  has been evaluated by myself as well as Dr. Abbey Chatters.  He is currently pain free and not having any nausea or vomiting.  It is likely that the patient had an acute episode of biliary colic which has resolved.  We do not see any clinical evidence of acute cholecystitis or acute diverticulitis.  We feel the patient will need a cholecystectomy, but it is ideal that the patient be off his aspirin for 5 days to decrease his bleeding risk.  Given that, currently this is not an urgent or emergent situation as the patient is asymptomatic, we will advance his diet and see how he does.  If he remains pain free, the patient can be discharged home and have elective cholecystectomy within the next couple of weeks off aspirin.  If the patient fails his diet, then we will proceed with cholecystectomy while he is an inpatient.  We have discussed the procedure including risks and complications with the patient and his wife and daughter present.  We discussed the possibility of bleeding, infection, wound healing problems, open cholecystectomy, bile duct injury, bile leak, injury to surrounding organs.  The patient understands this and wishes to proceed if that be the case.     Letha Cape, PA   ______________________________ Adolph Pollack, M.D.    KEO/MEDQ  D:  11/02/2010  T:  11/02/2010  Job:  [id number]  cc:   Erick Blinks, MD Wilhemina Bonito. Marina Goodell, MD Noralyn Pick. Eden Emms, MD, [facility] Georgina Quint. Plotnikov, MD  Electronically Signed by Barnetta Chapel PA on 11/07/2010 03:36:12 PM Electronically Signed by Avel Peace M.D. on 11/17/2010 09:15:39 AM

## 2010-11-17 NOTE — Consult Note (Signed)
  NAMENAYAN, PROCH                ACCOUNT NO.:  [id number]  MEDICAL RECORD NO.:  [id number]  LOCATION:                                 FACILITY:  PHYSICIAN:  Adolph Pollack, M.D.DATE OF BIRTH:  07/10/1925  DATE OF CONSULTATION:  11/02/2010 DATE OF DISCHARGE:                                CONSULTATION   Consultation at 1500.  REQUESTING PHYSICIAN:  Dr. Erick Blinks with [facility].  PRIMARY GASTROENTEROLOGIST:  Dr. Marina Goodell.  CONSULTING SURGEON:  Dr. Avel Peace.  PRIMARY CARE PHYSICIAN:  Dr. Posey Rea.  CARDIOLOGIST:  Dr. Eden Emms.  REASON FOR CONSULTATION:  Nonvisualization of the gallbladder on HIDA scan with abdominal pain.  HISTORY OF PRESENT ILLNESS:  Barry Molina is an 77 year old white male with a history of coronary artery disease, hypertension, who ate supper on Monday around 1800 p.m.  Around 2300, the patient started having severe supraumbilical/epigastric abdominal pain.  The patient states that several years ago prior to having his open heart surgery, the only pain he has had was indigestion which ended up being coronary artery disease causing atypical angina type symptoms.  Because of this prior history confirmed, the patient began taking nitroglycerin in case this pain was coming from a cardiac source.  He took 3 nitroglycerins which did not relieve his pain at all.  During this episode, the patient denied any nausea or vomiting.  He continued to have normal bowel movement.  He did have a significant amount of belching.  The patient states he has never had pain like this before.  Though the patient's wife eventually brought him to the emergency department.  Here he had a CT angiogram of the abdomen and pelvis which revealed layering gallstones in the gallbladder with some diverticulosis and possible stranding.  So at this time, the patient was admitted.  Dictation ended at this point.     Letha Cape, PA   ______________________________ Adolph Pollack, M.D.    KEO/MEDQ  D:  11/02/2010  T:  11/02/2010  Job:  [id number]  Electronically Signed by Barnetta Chapel PA on 11/07/2010 03:36:23 PM Electronically Signed by Avel Peace M.D. on 11/17/2010 09:15:53 AM

## 2010-11-22 ENCOUNTER — Telehealth (INDEPENDENT_AMBULATORY_CARE_PROVIDER_SITE_OTHER): Payer: Self-pay | Admitting: General Surgery

## 2010-12-09 ENCOUNTER — Encounter (INDEPENDENT_AMBULATORY_CARE_PROVIDER_SITE_OTHER): Payer: Self-pay | Admitting: General Surgery

## 2010-12-09 ENCOUNTER — Ambulatory Visit (INDEPENDENT_AMBULATORY_CARE_PROVIDER_SITE_OTHER): Payer: Medicare Other | Admitting: General Surgery

## 2010-12-09 VITALS — BP 134/82 | HR 76 | Temp 97.2°F | Resp 14 | Ht 71.0 in | Wt 198.0 lb

## 2010-12-09 DIAGNOSIS — K8070 Calculus of gallbladder and bile duct without cholecystitis without obstruction: Secondary | ICD-10-CM

## 2010-12-09 NOTE — Progress Notes (Signed)
Operation: Laparoscopic cholecystectomy, cholangiogram, transcystic common bile duct exploration  Date:  November 03, 2010  Pathology:  Benign  HPI:  [name] is here for his first postoperative visit. He initially had poor appetite and some diarrhea. Dr. Posey Molina gave him some medication in both these problems cleared up promptly. He is doing much better now.   Physical Exam: Gen.-elderly male in no acute distress.  Abdomen-small incisions are clean, dry, intact.  Assessment: He is progressing well currently  Plan: Activities as tolerated. Low-fat diet encourage. Return visit p.r.n.

## 2010-12-09 NOTE — Patient Instructions (Signed)
Low fat diet. Activities as tolerated. 

## 2010-12-19 ENCOUNTER — Other Ambulatory Visit (INDEPENDENT_AMBULATORY_CARE_PROVIDER_SITE_OTHER): Payer: Medicare Other

## 2010-12-19 DIAGNOSIS — E039 Hypothyroidism, unspecified: Secondary | ICD-10-CM

## 2010-12-19 LAB — BASIC METABOLIC PANEL
BUN: 20 mg/dL (ref 6–23)
CO2: 27 mEq/L (ref 19–32)
Calcium: 8.8 mg/dL (ref 8.4–10.5)
Chloride: 109 mEq/L (ref 96–112)
Creatinine, Ser: 0.7 mg/dL (ref 0.4–1.5)
GFR: 108.85 mL/min (ref >60.00)
Glucose, Bld: 97 mg/dL (ref 70–99)
Potassium: 3.9 mEq/L (ref 3.5–5.1)
Sodium: 143 mEq/L (ref 135–145)

## 2010-12-19 LAB — TSH: TSH: 3.23 u[IU]/mL (ref 0.35–5.50)

## 2010-12-21 ENCOUNTER — Ambulatory Visit (INDEPENDENT_AMBULATORY_CARE_PROVIDER_SITE_OTHER): Payer: Medicare Other | Admitting: Internal Medicine

## 2010-12-21 ENCOUNTER — Encounter: Payer: Self-pay | Admitting: Internal Medicine

## 2010-12-21 VITALS — BP 126/70 | HR 76 | Temp 97.5°F | Resp 16 | Wt 198.0 lb

## 2010-12-21 DIAGNOSIS — I10 Essential (primary) hypertension: Secondary | ICD-10-CM

## 2010-12-21 DIAGNOSIS — E538 Deficiency of other specified B group vitamins: Secondary | ICD-10-CM

## 2010-12-21 DIAGNOSIS — R634 Abnormal weight loss: Secondary | ICD-10-CM

## 2010-12-21 DIAGNOSIS — R110 Nausea: Secondary | ICD-10-CM

## 2010-12-21 DIAGNOSIS — Z23 Encounter for immunization: Secondary | ICD-10-CM

## 2010-12-21 DIAGNOSIS — E785 Hyperlipidemia, unspecified: Secondary | ICD-10-CM

## 2010-12-21 DIAGNOSIS — R197 Diarrhea, unspecified: Secondary | ICD-10-CM

## 2010-12-21 DIAGNOSIS — I2510 Atherosclerotic heart disease of native coronary artery without angina pectoris: Secondary | ICD-10-CM

## 2010-12-21 NOTE — Assessment & Plan Note (Signed)
Continue with current prescription therapy as reflected on the Med list.  

## 2010-12-21 NOTE — Assessment & Plan Note (Signed)
resolved 

## 2010-12-21 NOTE — Assessment & Plan Note (Signed)
Doing great now

## 2010-12-21 NOTE — Assessment & Plan Note (Signed)
Continue with current prescription therapy as reflected on the Med list. Chronic. No angina

## 2010-12-21 NOTE — Progress Notes (Signed)
  Subjective:    Patient ID: Barry Molina, male    DOB: Apr 12, 1925, 77 y.o.   MRN: [id number]  HPI  The patient presents for a follow-up of  chronic hypertension, chronic dyslipidemia,CAD controlled with medicines    Review of Systems  Constitutional: Positive for fatigue. Negative for appetite change and unexpected weight change.  HENT: Negative for nosebleeds, congestion, sore throat, sneezing, trouble swallowing and neck pain.   Eyes: Negative for itching and visual disturbance.  Respiratory: Negative for cough.   Cardiovascular: Negative for chest pain, palpitations and leg swelling.  Gastrointestinal: Negative for nausea, diarrhea, blood in stool and abdominal distention.  Genitourinary: Negative for frequency and hematuria.  Musculoskeletal: Negative for back pain, joint swelling and gait problem.  Skin: Negative for rash.  Neurological: Negative for dizziness, tremors, speech difficulty and weakness.  Psychiatric/Behavioral: Negative for sleep disturbance, dysphoric mood and agitation. The patient is not nervous/anxious.    Wt Readings from Last 3 Encounters:  12/21/10 198 lb (89.812 kg)  12/09/10 198 lb (89.812 kg)  11/16/10 191 lb (86.637 kg)        Objective:   Physical Exam  Constitutional: He is oriented to person, place, and time. He appears well-developed.  HENT:  Mouth/Throat: Oropharynx is clear and moist.  Eyes: Conjunctivae are normal. Pupils are equal, round, and reactive to light.  Neck: Normal range of motion. No JVD present. No thyromegaly present.  Cardiovascular: Normal rate, regular rhythm, normal heart sounds and intact distal pulses.  Exam reveals no gallop and no friction rub.   No murmur heard. Pulmonary/Chest: Effort normal and breath sounds normal. No respiratory distress. He has no wheezes. He has no rales. He exhibits no tenderness.  Abdominal: Soft. Bowel sounds are normal. He exhibits no distension and no mass. There is no tenderness. There  is no rebound and no guarding.  Musculoskeletal: Normal range of motion. He exhibits no edema and no tenderness.  Lymphadenopathy:    He has no cervical adenopathy.  Neurological: He is alert and oriented to person, place, and time. He has normal reflexes. No cranial nerve deficit. He exhibits normal muscle tone. Coordination normal.  Skin: Skin is warm and dry. No rash noted.  Psychiatric: He has a normal mood and affect. His behavior is normal. Judgment and thought content normal.   Lab Results  Component Value Date   WBC 8.7 11/05/2010   HGB 12.5* 11/05/2010   HCT 36.2* 11/05/2010   PLT 213 11/05/2010   GLUCOSE 97 12/19/2010   CHOL 155 01/07/2010   TRIG 113.0 01/07/2010   HDL 35.00* 01/07/2010   LDLDIRECT 189.0 01/28/2009   LDLCALC 97 01/07/2010   ALT 19 11/04/2010   AST 37 11/04/2010   NA 143 12/19/2010   K 3.9 12/19/2010   CL 109 12/19/2010   CREATININE 0.7 12/19/2010   BUN 20 12/19/2010   CO2 27 12/19/2010   TSH 3.23 12/19/2010   PSA 2.38 04/08/2007   INR 1.20 11/03/2010          Assessment & Plan:

## 2010-12-22 ENCOUNTER — Encounter: Payer: Self-pay | Admitting: Internal Medicine

## 2011-04-19 ENCOUNTER — Ambulatory Visit: Payer: Medicare Other | Admitting: Internal Medicine

## 2011-04-19 DIAGNOSIS — Z0289 Encounter for other administrative examinations: Secondary | ICD-10-CM

## 2011-05-31 ENCOUNTER — Encounter: Payer: Self-pay | Admitting: Internal Medicine

## 2011-05-31 ENCOUNTER — Ambulatory Visit (INDEPENDENT_AMBULATORY_CARE_PROVIDER_SITE_OTHER): Payer: Medicare Other | Admitting: Internal Medicine

## 2011-05-31 VITALS — BP 124/72 | HR 74 | Temp 98.1°F | Wt 215.0 lb

## 2011-05-31 DIAGNOSIS — E039 Hypothyroidism, unspecified: Secondary | ICD-10-CM

## 2011-05-31 DIAGNOSIS — E538 Deficiency of other specified B group vitamins: Secondary | ICD-10-CM

## 2011-05-31 DIAGNOSIS — Z951 Presence of aortocoronary bypass graft: Secondary | ICD-10-CM

## 2011-05-31 DIAGNOSIS — I10 Essential (primary) hypertension: Secondary | ICD-10-CM

## 2011-05-31 DIAGNOSIS — E785 Hyperlipidemia, unspecified: Secondary | ICD-10-CM

## 2011-05-31 DIAGNOSIS — C4360 Malignant melanoma of unspecified upper limb, including shoulder: Secondary | ICD-10-CM

## 2011-05-31 NOTE — Assessment & Plan Note (Signed)
Continue with current prescription therapy as reflected on the Med list.  

## 2011-05-31 NOTE — Progress Notes (Signed)
Patient ID: Barry Molina, male   DOB: [birth year], 78 y.o.   MRN: [id number]  Subjective:    Patient ID: Barry Molina, male    DOB: Jan 18, 1926, 78 y.o.   MRN: [id number]  HPI  The patient presents for a follow-up of  chronic hypertension, chronic dyslipidemia,CAD controlled with medicines  BP Readings from Last 3 Encounters:  05/31/11 124/72  12/21/10 126/70  12/09/10 134/82     Wt Readings from Last 3 Encounters:  05/31/11 215 lb (97.523 kg)  12/21/10 198 lb (89.812 kg)  12/09/10 198 lb (89.812 kg)     Review of Systems  Constitutional: Positive for fatigue. Negative for appetite change and unexpected weight change.  HENT: Negative for nosebleeds, congestion, sore throat, sneezing, trouble swallowing and neck pain.   Eyes: Negative for itching and visual disturbance.  Respiratory: Negative for cough.   Cardiovascular: Negative for chest pain, palpitations and leg swelling.  Gastrointestinal: Negative for nausea, diarrhea, blood in stool and abdominal distention.  Genitourinary: Negative for frequency and hematuria.  Musculoskeletal: Negative for back pain, joint swelling and gait problem.  Skin: Negative for rash.  Neurological: Negative for dizziness, tremors, speech difficulty and weakness.  Psychiatric/Behavioral: Negative for sleep disturbance, dysphoric mood and agitation. The patient is not nervous/anxious.    Wt Readings from Last 3 Encounters:  05/31/11 215 lb (97.523 kg)  12/21/10 198 lb (89.812 kg)  12/09/10 198 lb (89.812 kg)        Objective:   Physical Exam  Constitutional: He is oriented to person, place, and time. He appears well-developed.  HENT:  Mouth/Throat: Oropharynx is clear and moist.  Eyes: Conjunctivae are normal. Pupils are equal, round, and reactive to light.  Neck: Normal range of motion. No JVD present. No thyromegaly present.  Cardiovascular: Normal rate, regular rhythm, normal heart sounds and intact distal pulses.  Exam reveals no  gallop and no friction rub.   No murmur heard. Pulmonary/Chest: Effort normal and breath sounds normal. No respiratory distress. He has no wheezes. He has no rales. He exhibits no tenderness.  Abdominal: Soft. Bowel sounds are normal. He exhibits no distension and no mass. There is no tenderness. There is no rebound and no guarding.  Musculoskeletal: Normal range of motion. He exhibits no edema and no tenderness.  Lymphadenopathy:    He has no cervical adenopathy.  Neurological: He is alert and oriented to person, place, and time. He has normal reflexes. No cranial nerve deficit. He exhibits normal muscle tone. Coordination normal.  Skin: Skin is warm and dry. No rash noted.  Psychiatric: He has a normal mood and affect. His behavior is normal. Judgment and thought content normal.   Lab Results  Component Value Date   WBC 8.7 11/05/2010   HGB 12.5* 11/05/2010   HCT 36.2* 11/05/2010   PLT 213 11/05/2010   GLUCOSE 97 12/19/2010   CHOL 155 01/07/2010   TRIG 113.0 01/07/2010   HDL 35.00* 01/07/2010   LDLDIRECT 189.0 01/28/2009   LDLCALC 97 01/07/2010   ALT 19 11/04/2010   AST 37 11/04/2010   NA 143 12/19/2010   K 3.9 12/19/2010   CL 109 12/19/2010   CREATININE 0.7 12/19/2010   BUN 20 12/19/2010   CO2 27 12/19/2010   TSH 3.23 12/19/2010   PSA 2.38 04/08/2007   INR 1.20 11/03/2010          Assessment & Plan:

## 2011-05-31 NOTE — Assessment & Plan Note (Signed)
F/u w/derm q 12 mo

## 2011-07-23 ENCOUNTER — Other Ambulatory Visit: Payer: Self-pay | Admitting: Internal Medicine

## 2011-08-25 ENCOUNTER — Other Ambulatory Visit: Payer: Self-pay | Admitting: Internal Medicine

## 2011-09-04 ENCOUNTER — Other Ambulatory Visit (INDEPENDENT_AMBULATORY_CARE_PROVIDER_SITE_OTHER): Payer: Medicare Other

## 2011-09-04 DIAGNOSIS — C4360 Malignant melanoma of unspecified upper limb, including shoulder: Secondary | ICD-10-CM

## 2011-09-04 DIAGNOSIS — Z951 Presence of aortocoronary bypass graft: Secondary | ICD-10-CM

## 2011-09-04 DIAGNOSIS — E039 Hypothyroidism, unspecified: Secondary | ICD-10-CM

## 2011-09-04 DIAGNOSIS — I10 Essential (primary) hypertension: Secondary | ICD-10-CM

## 2011-09-04 DIAGNOSIS — E538 Deficiency of other specified B group vitamins: Secondary | ICD-10-CM

## 2011-09-04 LAB — CBC WITH DIFFERENTIAL/PLATELET
Basophils Absolute: 0 10*3/uL (ref 0.0–0.1)
HCT: 40.7 % (ref 39.0–52.0)
Lymphs Abs: 1.6 10*3/uL (ref 0.7–4.0)
MCHC: 33.2 g/dL (ref 30.0–36.0)
MCV: 95.9 fl (ref 78.0–100.0)
Monocytes Absolute: 0.4 10*3/uL (ref 0.1–1.0)
Platelets: 199 10*3/uL (ref 150.0–400.0)
RDW: 14.4 % (ref 11.5–14.6)

## 2011-09-04 LAB — HEPATIC FUNCTION PANEL: Total Bilirubin: 1 mg/dL (ref 0.3–1.2)

## 2011-09-04 LAB — BASIC METABOLIC PANEL
BUN: 19 mg/dL (ref 6–23)
CO2: 26 mEq/L (ref 19–32)
Chloride: 106 mEq/L (ref 96–112)
Glucose, Bld: 99 mg/dL (ref 70–99)
Potassium: 3.9 mEq/L (ref 3.5–5.1)

## 2011-09-05 ENCOUNTER — Ambulatory Visit (INDEPENDENT_AMBULATORY_CARE_PROVIDER_SITE_OTHER): Payer: Medicare Other | Admitting: Internal Medicine

## 2011-09-05 ENCOUNTER — Encounter: Payer: Self-pay | Admitting: Internal Medicine

## 2011-09-05 VITALS — BP 120/80 | HR 80 | Temp 98.2°F | Resp 16 | Wt 213.0 lb

## 2011-09-05 DIAGNOSIS — M79609 Pain in unspecified limb: Secondary | ICD-10-CM

## 2011-09-05 DIAGNOSIS — E538 Deficiency of other specified B group vitamins: Secondary | ICD-10-CM

## 2011-09-05 DIAGNOSIS — E039 Hypothyroidism, unspecified: Secondary | ICD-10-CM

## 2011-09-05 DIAGNOSIS — I2510 Atherosclerotic heart disease of native coronary artery without angina pectoris: Secondary | ICD-10-CM

## 2011-09-05 DIAGNOSIS — I10 Essential (primary) hypertension: Secondary | ICD-10-CM

## 2011-09-05 MED ORDER — CYANOCOBALAMIN 1000 MCG/ML IJ SOLN: 1000.0000 ug | INTRAMUSCULAR | Status: DC

## 2011-09-05 MED ORDER — METHYLPREDNISOLONE ACETATE 80 MG/ML IJ SUSP: 40.0000 mg | Freq: Once | INTRAMUSCULAR | Status: DC

## 2011-09-05 NOTE — Assessment & Plan Note (Signed)
Continue with current prescription therapy as reflected on the Med list.  

## 2011-09-05 NOTE — Patient Instructions (Signed)
Postprocedure instructions :    A Band-Aid should be left on for 12 hours. Injection therapy is not a cure itself. It is used in conjunction with other modalities. You can use nonsteroidal anti-inflammatories like ibuprofen , hot and cold compresses. Rest is recommended in the next 24 hours. You need to report immediately  if fever, chills or any signs of infection develop. 

## 2011-09-05 NOTE — Progress Notes (Signed)
Subjective:    Patient ID: Barry Molina, male    DOB: Dec 30, 1925, 78 y.o.   MRN: [id number]  Knee Pain  The incident occurred more than 1 week ago. There was no injury mechanism. The pain is present in the left knee. The quality of the pain is described as aching. The pain is at a severity of 4/10. The pain is moderate. He reports no foreign bodies present. The treatment provided mild relief.    The patient presents for a follow-up of  chronic hypertension, chronic dyslipidemia,CAD controlled with medicines  BP Readings from Last 3 Encounters:  09/05/11 120/80  05/31/11 124/72  12/21/10 126/70     Wt Readings from Last 3 Encounters:  09/05/11 213 lb (96.616 kg)  05/31/11 215 lb (97.523 kg)  12/21/10 198 lb (89.812 kg)     Review of Systems  Constitutional: Positive for fatigue. Negative for appetite change and unexpected weight change.  HENT: Negative for nosebleeds, congestion, sore throat, sneezing, trouble swallowing and neck pain.   Eyes: Negative for itching and visual disturbance.  Respiratory: Negative for cough.   Cardiovascular: Negative for chest pain, palpitations and leg swelling.  Gastrointestinal: Negative for nausea, diarrhea, blood in stool and abdominal distention.  Genitourinary: Negative for frequency and hematuria.  Musculoskeletal: Positive for arthralgias and gait problem. Negative for back pain and joint swelling.  Skin: Negative for rash.  Neurological: Negative for dizziness, tremors, speech difficulty and weakness.  Psychiatric/Behavioral: Negative for disturbed wake/sleep cycle, dysphoric mood and agitation. The patient is not nervous/anxious.    Wt Readings from Last 3 Encounters:  09/05/11 213 lb (96.616 kg)  05/31/11 215 lb (97.523 kg)  12/21/10 198 lb (89.812 kg)        Objective:   Physical Exam  Constitutional: He is oriented to person, place, and time. He appears well-developed.  HENT:  Mouth/Throat: Oropharynx is clear and moist.   Eyes: Conjunctivae are normal. Pupils are equal, round, and reactive to light.  Neck: Normal range of motion. No JVD present. No thyromegaly present.  Cardiovascular: Normal rate, regular rhythm, normal heart sounds and intact distal pulses.  Exam reveals no gallop and no friction rub.   No murmur heard. Pulmonary/Chest: Effort normal and breath sounds normal. No respiratory distress. He has no wheezes. He has no rales. He exhibits no tenderness.  Abdominal: Soft. Bowel sounds are normal. He exhibits no distension and no mass. There is no tenderness. There is no rebound and no guarding.  Musculoskeletal: Normal range of motion. He exhibits tenderness. He exhibits no edema.       L knee is tender in b. anserina  Lymphadenopathy:    He has no cervical adenopathy.  Neurological: He is alert and oriented to person, place, and time. He has normal reflexes. No cranial nerve deficit. He exhibits normal muscle tone. Coordination normal.  Skin: Skin is warm and dry. No rash noted.  Psychiatric: He has a normal mood and affect. His behavior is normal. Judgment and thought content normal.   Lab Results  Component Value Date   WBC 5.5 09/04/2011   HGB 13.5 09/04/2011   HCT 40.7 09/04/2011   PLT 199.0 09/04/2011   GLUCOSE 99 09/04/2011   CHOL 155 01/07/2010   TRIG 113.0 01/07/2010   HDL 35.00* 01/07/2010   LDLDIRECT 189.0 01/28/2009   LDLCALC 97 01/07/2010   ALT 14 09/04/2011   AST 22 09/04/2011   NA 141 09/04/2011   K 3.9 09/04/2011   CL 106 09/04/2011  CREATININE 1.0 09/04/2011   BUN 19 09/04/2011   CO2 26 09/04/2011   TSH 3.23 12/19/2010   PSA 2.38 04/08/2007   INR 1.20 11/03/2010     Procedure Note :    Procedure :Joint Injection,  Knee bursa ancerina L   Indication: Bursitis with refractory  chronic pain.   Risks including unsuccessful procedure , bleeding, infection, bruising, skin atrophy and others were explained to the patient in detail as well as the benefits. Informed consent was obtained and signed.     Tthe patient was placed in a comfortable position. Skin was prepped with Betadine and alcohol  and anesthetized with a cooling spray. Then, a 3 cc syringe with a 1.5 inch long 25-gauge needle was used for a L bursa injection in a fan-like fasion with 3 mL of 2% lidocaine and 40 mg of Depo-Medrol .  Band-Aid was applied.   Tolerated well. Complications: None. Good pain relief following the procedure.   Postprocedure instructions :    A Band-Aid should be left on for 12 hours. Injection therapy is not a cure itself. It is used in conjunction with other modalities. You can use nonsteroidal anti-inflammatories like ibuprofen , hot and cold compresses. Rest is recommended in the next 24 hours. You need to report immediately  if fever, chills or any signs of infection develop.      Assessment & Plan:

## 2011-09-05 NOTE — Assessment & Plan Note (Signed)
Options discussed - see procedure

## 2011-09-06 ENCOUNTER — Other Ambulatory Visit: Payer: Self-pay | Admitting: Internal Medicine

## 2011-10-23 ENCOUNTER — Telehealth: Payer: Self-pay | Admitting: Internal Medicine

## 2011-10-23 ENCOUNTER — Encounter: Payer: Self-pay | Admitting: Internal Medicine

## 2011-10-23 ENCOUNTER — Ambulatory Visit (INDEPENDENT_AMBULATORY_CARE_PROVIDER_SITE_OTHER): Payer: Medicare Other | Admitting: Internal Medicine

## 2011-10-23 ENCOUNTER — Ambulatory Visit (INDEPENDENT_AMBULATORY_CARE_PROVIDER_SITE_OTHER)
Admission: RE | Admit: 2011-10-23 | Discharge: 2011-10-23 | Disposition: A | Discharge status: Home / Self Care | Payer: Medicare Other | Source: Ambulatory Visit | Attending: Internal Medicine | Admitting: Internal Medicine

## 2011-10-23 VITALS — BP 122/80 | HR 67 | Temp 97.4°F | Wt 209.0 lb

## 2011-10-23 DIAGNOSIS — Y92009 Unspecified place in unspecified non-institutional (private) residence as the place of occurrence of the external cause: Secondary | ICD-10-CM | POA: Insufficient documentation

## 2011-10-23 DIAGNOSIS — R071 Chest pain on breathing: Secondary | ICD-10-CM

## 2011-10-23 DIAGNOSIS — I2510 Atherosclerotic heart disease of native coronary artery without angina pectoris: Secondary | ICD-10-CM

## 2011-10-23 DIAGNOSIS — W19XXXA Unspecified fall, initial encounter: Secondary | ICD-10-CM

## 2011-10-23 DIAGNOSIS — R0789 Other chest pain: Secondary | ICD-10-CM

## 2011-10-23 MED ORDER — HYDROCODONE-ACETAMINOPHEN 7.5-325 MG PO TABS: 1.0000 | ORAL_TABLET | Freq: Four times a day (QID) | ORAL | Status: AC | PRN

## 2011-10-23 NOTE — Assessment & Plan Note (Signed)
No other injuries are evident now

## 2011-10-23 NOTE — Assessment & Plan Note (Signed)
US X ray Rib belt See meds 

## 2011-10-23 NOTE — Assessment & Plan Note (Signed)
No cardiac sx's Continue with current prescription therapy as reflected on the Med list.

## 2011-10-23 NOTE — Telephone Encounter (Signed)
Barry Molina, please, inform patient that there was no rib fx on rib xray Thx

## 2011-10-23 NOTE — Patient Instructions (Signed)
Call if not better 

## 2011-10-23 NOTE — Progress Notes (Signed)
Patient ID: Barry Molina, male   DOB: [birth year], 78 y.o.   MRN: [id number]   Subjective:    Patient ID: Barry Molina, male    DOB: [birth year], 78 y.o.   MRN: [id number]  Fall The accident occurred 3 to 5 days ago. The fall occurred while walking (slid on mud). He landed on grass. There was no blood loss. The point of impact was the right shoulder. The pain is present in the right upper arm (R chest). The pain is at a severity of 9/10. Pertinent negatives include no hematuria or nausea. He has tried NSAID and acetaminophen for the symptoms. The treatment provided mild relief.    The patient presents for a follow-up of  chronic hypertension, chronic dyslipidemia,CAD controlled with medicines  BP Readings from Last 3 Encounters:  10/23/11 122/80  09/05/11 120/80  05/31/11 124/72     Wt Readings from Last 3 Encounters:  10/23/11 209 lb (94.802 kg)  09/05/11 213 lb (96.616 kg)  05/31/11 215 lb (97.523 kg)     Review of Systems  Constitutional: Positive for fatigue. Negative for appetite change and unexpected weight change.  HENT: Negative for nosebleeds, congestion, sore throat, sneezing, trouble swallowing and neck pain.   Eyes: Negative for itching and visual disturbance.  Respiratory: Negative for cough.   Cardiovascular: Negative for palpitations and leg swelling.  Gastrointestinal: Negative for nausea, diarrhea, blood in stool and abdominal distention.  Genitourinary: Negative for frequency and hematuria.  Musculoskeletal: Positive for arthralgias and gait problem. Negative for joint swelling.  Skin: Negative for rash.  Neurological: Negative for dizziness, tremors and speech difficulty.  Psychiatric/Behavioral: Negative for disturbed wake/sleep cycle, dysphoric mood and agitation. The patient is not nervous/anxious.    Wt Readings from Last 3 Encounters:  10/23/11 209 lb (94.802 kg)  09/05/11 213 lb (96.616 kg)  05/31/11 215 lb (97.523 kg)        Objective:   Physical Exam  Constitutional: He is oriented to person, place, and time. He appears well-developed.  HENT:  Mouth/Throat: Oropharynx is clear and moist.  Eyes: Conjunctivae are normal. Pupils are equal, round, and reactive to light.  Neck: Normal range of motion. No JVD present. No thyromegaly present.  Cardiovascular: Normal rate, regular rhythm, normal heart sounds and intact distal pulses.  Exam reveals no gallop and no friction rub.   No murmur heard. Pulmonary/Chest: Effort normal and breath sounds normal. No respiratory distress. He has no wheezes. He has no rales. He exhibits tenderness (R anterolat).  Abdominal: Soft. Bowel sounds are normal. He exhibits no distension and no mass. There is no tenderness. There is no rebound and no guarding.  Musculoskeletal: Normal range of motion. He exhibits tenderness. He exhibits no edema.       L knee is tender in b. anserina  Lymphadenopathy:    He has no cervical adenopathy.  Neurological: He is alert and oriented to person, place, and time. He has normal reflexes. No cranial nerve deficit. He exhibits normal muscle tone. Coordination normal.  Skin: Skin is warm and dry. No rash noted.  Psychiatric: He has a normal mood and affect. His behavior is normal. Judgment and thought content normal.   Lab Results  Component Value Date   WBC 5.5 09/04/2011   HGB 13.5 09/04/2011   HCT 40.7 09/04/2011   PLT 199.0 09/04/2011   GLUCOSE 99 09/04/2011   CHOL 155 01/07/2010   TRIG 113.0 01/07/2010   HDL 35.00* 01/07/2010   LDLDIRECT 189.0 01/28/2009  LDLCALC 97 01/07/2010   ALT 14 09/04/2011   AST 22 09/04/2011   NA 141 09/04/2011   K 3.9 09/04/2011   CL 106 09/04/2011   CREATININE 1.0 09/04/2011   BUN 19 09/04/2011   CO2 26 09/04/2011   TSH 3.23 12/19/2010   PSA 2.38 04/08/2007   INR 1.20 11/03/2010   Procedure Note :    Procedure :   Point of care (POC) sonography examination of the chest wall   Indication: R chest pain, s/p fall   Equipment used: Sonosite  M-Turbo with HFL38x/13-6 MHz transducer linear probe. The images were stored in the unit and later transferred in storage.  The patient was placed in a sitting position.  This study revealed no rib fractures in the area of interest (anterolateral R chest). Normal pleura sliding noted   Impression: no rib fracture(s) in the R anterolateral chest        Assessment & Plan:

## 2011-10-24 ENCOUNTER — Other Ambulatory Visit: Payer: Self-pay | Admitting: Internal Medicine

## 2011-10-24 NOTE — Telephone Encounter (Signed)
Pt informed

## 2011-12-06 ENCOUNTER — Encounter: Payer: Self-pay | Admitting: Internal Medicine

## 2011-12-06 ENCOUNTER — Ambulatory Visit (INDEPENDENT_AMBULATORY_CARE_PROVIDER_SITE_OTHER): Payer: Medicare Other | Admitting: Internal Medicine

## 2011-12-06 VITALS — BP 118/76 | HR 84 | Temp 97.6°F | Resp 16 | Wt 202.0 lb

## 2011-12-06 DIAGNOSIS — M79609 Pain in unspecified limb: Secondary | ICD-10-CM

## 2011-12-06 DIAGNOSIS — I2510 Atherosclerotic heart disease of native coronary artery without angina pectoris: Secondary | ICD-10-CM

## 2011-12-06 DIAGNOSIS — E039 Hypothyroidism, unspecified: Secondary | ICD-10-CM

## 2011-12-06 DIAGNOSIS — E538 Deficiency of other specified B group vitamins: Secondary | ICD-10-CM

## 2011-12-06 DIAGNOSIS — I10 Essential (primary) hypertension: Secondary | ICD-10-CM

## 2011-12-06 DIAGNOSIS — R634 Abnormal weight loss: Secondary | ICD-10-CM

## 2011-12-06 DIAGNOSIS — R0789 Other chest pain: Secondary | ICD-10-CM

## 2011-12-06 DIAGNOSIS — R071 Chest pain on breathing: Secondary | ICD-10-CM

## 2011-12-06 MED ORDER — "SYRINGE/NEEDLE (DISP) 27G X 1/2"" 1 ML MISC": Status: DC

## 2011-12-06 MED ORDER — DICLOFENAC SODIUM 1 % TD GEL: 4.0000 g | Freq: Four times a day (QID) | TRANSDERMAL | Status: DC

## 2011-12-06 MED ORDER — CYANOCOBALAMIN 1000 MCG/ML IJ SOLN: 1000.0000 ug | INTRAMUSCULAR | Status: DC

## 2011-12-06 NOTE — Assessment & Plan Note (Signed)
Continue with current prescription therapy as reflected on the Med list.  

## 2011-12-06 NOTE — Progress Notes (Signed)
   Subjective:    Patient ID: Barry Molina, male    DOB: Jul 05, 1925, 78 y.o.   MRN: [id number]  HPI  The patient presents for a follow-up of  chronic hypertension, chronic dyslipidemia,CAD controlled with medicines  BP Readings from Last 3 Encounters:  12/06/11 118/76  10/23/11 122/80  09/05/11 120/80     Wt Readings from Last 3 Encounters:  12/06/11 202 lb (91.627 kg)  10/23/11 209 lb (94.802 kg)  09/05/11 213 lb (96.616 kg)     Review of Systems  Constitutional: Positive for fatigue. Negative for appetite change and unexpected weight change.  HENT: Negative for nosebleeds, congestion, sore throat, sneezing, trouble swallowing and neck pain.   Eyes: Negative for itching and visual disturbance.  Respiratory: Negative for cough.   Cardiovascular: Negative for palpitations and leg swelling.  Gastrointestinal: Negative for diarrhea, blood in stool and abdominal distention.  Genitourinary: Negative for frequency.  Musculoskeletal: Positive for arthralgias and gait problem. Negative for joint swelling.       R knee hurts  Skin: Negative for rash.  Neurological: Negative for dizziness, tremors and speech difficulty.  Psychiatric/Behavioral: Positive for self-injury. Negative for disturbed wake/sleep cycle, dysphoric mood and agitation. The patient is not nervous/anxious.    Wt Readings from Last 3 Encounters:  12/06/11 202 lb (91.627 kg)  10/23/11 209 lb (94.802 kg)  09/05/11 213 lb (96.616 kg)        Objective:   Physical Exam  Constitutional: He is oriented to person, place, and time. He appears well-developed.  HENT:  Mouth/Throat: Oropharynx is clear and moist.  Eyes: Conjunctivae normal are normal. Pupils are equal, round, and reactive to light.  Neck: Normal range of motion. No JVD present. No thyromegaly present.  Cardiovascular: Normal rate, regular rhythm, normal heart sounds and intact distal pulses.  Exam reveals no gallop and no friction rub.   No murmur  heard. Pulmonary/Chest: Effort normal and breath sounds normal. No respiratory distress. He has no wheezes. He has no rales. He exhibits tenderness (R anterolat).  Abdominal: Soft. Bowel sounds are normal. He exhibits no distension and no mass. There is no tenderness. There is no rebound and no guarding.  Musculoskeletal: Normal range of motion. He exhibits tenderness. He exhibits no edema.       R knee is tender in the dist IT band section  Lymphadenopathy:    He has no cervical adenopathy.  Neurological: He is alert and oriented to person, place, and time. He has normal reflexes. No cranial nerve deficit. He exhibits normal muscle tone. Coordination normal.  Skin: Skin is warm and dry. No rash noted.  Psychiatric: He has a normal mood and affect. His behavior is normal. Judgment and thought content normal.   Lab Results  Component Value Date   WBC 5.5 09/04/2011   HGB 13.5 09/04/2011   HCT 40.7 09/04/2011   PLT 199.0 09/04/2011   GLUCOSE 99 09/04/2011   CHOL 155 01/07/2010   TRIG 113.0 01/07/2010   HDL 35.00* 01/07/2010   LDLDIRECT 189.0 01/28/2009   LDLCALC 97 01/07/2010   ALT 14 09/04/2011   AST 22 09/04/2011   NA 141 09/04/2011   K 3.9 09/04/2011   CL 106 09/04/2011   CREATININE 1.0 09/04/2011   BUN 19 09/04/2011   CO2 26 09/04/2011   TSH 3.23 12/19/2010   PSA 2.38 04/08/2007   INR 1.20 11/03/2010           Assessment & Plan:

## 2011-12-06 NOTE — Assessment & Plan Note (Signed)
Resolved

## 2011-12-06 NOTE — Assessment & Plan Note (Signed)
Voltaren gel or Advil 2 bid pc New shoes

## 2011-12-06 NOTE — Assessment & Plan Note (Signed)
Wt Readings from Last 3 Encounters:  12/06/11 202 lb (91.627 kg)  10/23/11 209 lb (94.802 kg)  09/05/11 213 lb (96.616 kg)

## 2012-02-12 ENCOUNTER — Ambulatory Visit (INDEPENDENT_AMBULATORY_CARE_PROVIDER_SITE_OTHER): Payer: Medicare Other | Admitting: Internal Medicine

## 2012-02-12 ENCOUNTER — Encounter: Payer: Self-pay | Admitting: Internal Medicine

## 2012-02-12 VITALS — BP 138/80 | HR 76 | Temp 98.0°F | Resp 16 | Wt 198.0 lb

## 2012-02-12 DIAGNOSIS — Z951 Presence of aortocoronary bypass graft: Secondary | ICD-10-CM

## 2012-02-12 DIAGNOSIS — I10 Essential (primary) hypertension: Secondary | ICD-10-CM

## 2012-02-12 DIAGNOSIS — E039 Hypothyroidism, unspecified: Secondary | ICD-10-CM

## 2012-02-12 DIAGNOSIS — E538 Deficiency of other specified B group vitamins: Secondary | ICD-10-CM

## 2012-02-12 DIAGNOSIS — R634 Abnormal weight loss: Secondary | ICD-10-CM

## 2012-02-12 NOTE — Assessment & Plan Note (Signed)
Watching  Wt Readings from Last 3 Encounters:  02/12/12 198 lb (89.812 kg)  12/06/11 202 lb (91.627 kg)  10/23/11 209 lb (94.802 kg)

## 2012-02-12 NOTE — Assessment & Plan Note (Signed)
Continue with current prescription therapy as reflected on the Med list.  

## 2012-02-12 NOTE — Progress Notes (Signed)
   Subjective:    Patient ID: Barry Molina, male    DOB: December 12, 1925, 78 y.o.   MRN: [id number]  HPI  The patient presents for a follow-up of  chronic hypertension, chronic dyslipidemia,CAD controlled with medicines  BP Readings from Last 3 Encounters:  02/12/12 138/80  12/06/11 118/76  10/23/11 122/80     Wt Readings from Last 3 Encounters:  02/12/12 198 lb (89.812 kg)  12/06/11 202 lb (91.627 kg)  10/23/11 209 lb (94.802 kg)     Review of Systems  Constitutional: Positive for fatigue. Negative for appetite change and unexpected weight change.  HENT: Negative for nosebleeds, congestion, sore throat, sneezing, trouble swallowing and neck pain.   Eyes: Negative for itching and visual disturbance.  Respiratory: Negative for cough.   Cardiovascular: Negative for palpitations and leg swelling.  Gastrointestinal: Negative for diarrhea, blood in stool and abdominal distention.  Genitourinary: Negative for frequency.  Musculoskeletal: Positive for arthralgias and gait problem. Negative for joint swelling.       R knee hurts  Skin: Negative for rash.  Neurological: Negative for dizziness, tremors and speech difficulty.  Psychiatric/Behavioral: Positive for self-injury. Negative for sleep disturbance, dysphoric mood and agitation. The patient is not nervous/anxious.          Objective:   Physical Exam  Constitutional: He is oriented to person, place, and time. He appears well-developed.  HENT:  Mouth/Throat: Oropharynx is clear and moist.  Eyes: Conjunctivae normal are normal. Pupils are equal, round, and reactive to light.  Neck: Normal range of motion. No JVD present. No thyromegaly present.  Cardiovascular: Normal rate, regular rhythm, normal heart sounds and intact distal pulses.  Exam reveals no gallop and no friction rub.   No murmur heard. Pulmonary/Chest: Effort normal and breath sounds normal. No respiratory distress. He has no wheezes. He has no rales. He exhibits  tenderness (R anterolat).  Abdominal: Soft. Bowel sounds are normal. He exhibits no distension and no mass. There is no tenderness. There is no rebound and no guarding.  Musculoskeletal: Normal range of motion. He exhibits tenderness. He exhibits no edema.       R knee is tender in the dist IT band section  Lymphadenopathy:    He has no cervical adenopathy.  Neurological: He is alert and oriented to person, place, and time. He has normal reflexes. No cranial nerve deficit. He exhibits normal muscle tone. Coordination normal.  Skin: Skin is warm and dry. No rash noted.  Psychiatric: He has a normal mood and affect. His behavior is normal. Judgment and thought content normal.   Lab Results  Component Value Date   WBC 5.5 09/04/2011   HGB 13.5 09/04/2011   HCT 40.7 09/04/2011   PLT 199.0 09/04/2011   GLUCOSE 99 09/04/2011   CHOL 155 01/07/2010   TRIG 113.0 01/07/2010   HDL 35.00* 01/07/2010   LDLDIRECT 189.0 01/28/2009   LDLCALC 97 01/07/2010   ALT 14 09/04/2011   AST 22 09/04/2011   NA 141 09/04/2011   K 3.9 09/04/2011   CL 106 09/04/2011   CREATININE 1.0 09/04/2011   BUN 19 09/04/2011   CO2 26 09/04/2011   TSH 3.23 12/19/2010   PSA 2.38 04/08/2007   INR 1.20 11/03/2010           Assessment & Plan:

## 2012-02-14 ENCOUNTER — Other Ambulatory Visit: Payer: Self-pay | Admitting: Internal Medicine

## 2012-02-17 ENCOUNTER — Other Ambulatory Visit: Payer: Self-pay | Admitting: Internal Medicine

## 2012-04-15 ENCOUNTER — Telehealth: Payer: Self-pay | Admitting: Internal Medicine

## 2012-04-15 NOTE — Telephone Encounter (Signed)
Pt req refill for Prilosec OTC 20mg  to be send to [facility], pt stated that he changed insurance and drug store. Please call pt once its done.

## 2012-04-16 ENCOUNTER — Other Ambulatory Visit: Payer: Self-pay | Admitting: *Deleted

## 2012-04-16 MED ORDER — OMEPRAZOLE 40 MG PO CPDR: 40.0000 mg | DELAYED_RELEASE_CAPSULE | Freq: Every day | ORAL | Status: DC

## 2012-04-16 MED ORDER — OMEPRAZOLE MAGNESIUM 20 MG PO TBEC: 20.0000 mg | DELAYED_RELEASE_TABLET | Freq: Every day | ORAL | Status: DC

## 2012-04-16 NOTE — Telephone Encounter (Signed)
Done. Pt informed.

## 2012-05-10 ENCOUNTER — Other Ambulatory Visit: Payer: Self-pay | Admitting: Internal Medicine

## 2012-05-13 ENCOUNTER — Other Ambulatory Visit (INDEPENDENT_AMBULATORY_CARE_PROVIDER_SITE_OTHER): Payer: Medicare Other

## 2012-05-13 DIAGNOSIS — R634 Abnormal weight loss: Secondary | ICD-10-CM

## 2012-05-13 DIAGNOSIS — E538 Deficiency of other specified B group vitamins: Secondary | ICD-10-CM

## 2012-05-13 DIAGNOSIS — E039 Hypothyroidism, unspecified: Secondary | ICD-10-CM

## 2012-05-13 DIAGNOSIS — I10 Essential (primary) hypertension: Secondary | ICD-10-CM

## 2012-05-13 DIAGNOSIS — Z951 Presence of aortocoronary bypass graft: Secondary | ICD-10-CM

## 2012-05-13 LAB — BASIC METABOLIC PANEL
BUN: 19 mg/dL (ref 6–23)
Calcium: 9 mg/dL (ref 8.4–10.5)
Creatinine, Ser: 0.9 mg/dL (ref 0.4–1.5)
GFR: 84.13 mL/min (ref >60.00)

## 2012-05-13 LAB — VITAMIN B12: Vitamin B-12: 524 pg/mL (ref 211–911)

## 2012-05-14 ENCOUNTER — Ambulatory Visit (INDEPENDENT_AMBULATORY_CARE_PROVIDER_SITE_OTHER): Payer: Medicare PPO | Admitting: Internal Medicine

## 2012-05-14 ENCOUNTER — Encounter: Payer: Self-pay | Admitting: Internal Medicine

## 2012-05-14 VITALS — BP 138/72 | HR 80 | Temp 96.7°F | Resp 16 | Wt 204.0 lb

## 2012-05-14 DIAGNOSIS — E039 Hypothyroidism, unspecified: Secondary | ICD-10-CM

## 2012-05-14 DIAGNOSIS — W19XXXA Unspecified fall, initial encounter: Secondary | ICD-10-CM

## 2012-05-14 DIAGNOSIS — I2510 Atherosclerotic heart disease of native coronary artery without angina pectoris: Secondary | ICD-10-CM

## 2012-05-14 DIAGNOSIS — E538 Deficiency of other specified B group vitamins: Secondary | ICD-10-CM

## 2012-05-14 DIAGNOSIS — R634 Abnormal weight loss: Secondary | ICD-10-CM

## 2012-05-14 DIAGNOSIS — K219 Gastro-esophageal reflux disease without esophagitis: Secondary | ICD-10-CM

## 2012-05-14 DIAGNOSIS — Y92009 Unspecified place in unspecified non-institutional (private) residence as the place of occurrence of the external cause: Secondary | ICD-10-CM

## 2012-05-14 NOTE — Assessment & Plan Note (Signed)
Continue with current prescription therapy as reflected on the Med list.  

## 2012-05-14 NOTE — Assessment & Plan Note (Signed)
No relapse 

## 2012-05-14 NOTE — Assessment & Plan Note (Signed)
Wt Readings from Last 3 Encounters:  05/14/12 204 lb (92.534 kg)  02/12/12 198 lb (89.812 kg)  12/06/11 202 lb (91.627 kg)  Resolved

## 2012-05-14 NOTE — Progress Notes (Signed)
    Subjective:    HPI  The patient presents for a follow-up of  chronic hypertension, chronic dyslipidemia,CAD controlled with medicines. He is a blood donor  BP Readings from Last 3 Encounters:  05/14/12 138/72  02/12/12 138/80  12/06/11 118/76     Wt Readings from Last 3 Encounters:  05/14/12 204 lb (92.534 kg)  02/12/12 198 lb (89.812 kg)  12/06/11 202 lb (91.627 kg)     Review of Systems  Constitutional: Positive for fatigue. Negative for appetite change and unexpected weight change.  HENT: Negative for nosebleeds, congestion, sore throat, sneezing, trouble swallowing and neck pain.   Eyes: Negative for itching and visual disturbance.  Respiratory: Negative for cough.   Cardiovascular: Negative for palpitations and leg swelling.  Gastrointestinal: Negative for diarrhea, blood in stool and abdominal distention.  Genitourinary: Negative for frequency.  Musculoskeletal: Positive for arthralgias and gait problem. Negative for joint swelling.       R knee hurts  Skin: Negative for rash.  Neurological: Negative for dizziness, tremors and speech difficulty.  Psychiatric/Behavioral: Positive for self-injury. Negative for sleep disturbance, dysphoric mood and agitation. The patient is not nervous/anxious.          Objective:   Physical Exam  Constitutional: He is oriented to person, place, and time. He appears well-developed.  HENT:  Mouth/Throat: Oropharynx is clear and moist.  Eyes: Conjunctivae are normal. Pupils are equal, round, and reactive to light.  Neck: Normal range of motion. No JVD present. No thyromegaly present.  Cardiovascular: Normal rate, regular rhythm, normal heart sounds and intact distal pulses.  Exam reveals no gallop and no friction rub.   No murmur heard. Pulmonary/Chest: Effort normal and breath sounds normal. No respiratory distress. He has no wheezes. He has no rales. He exhibits tenderness (R anterolat).  Abdominal: Soft. Bowel sounds are  normal. He exhibits no distension and no mass. There is no tenderness. There is no rebound and no guarding.  Musculoskeletal: Normal range of motion. He exhibits tenderness. He exhibits no edema.  R knee is tender in the dist IT band section  Lymphadenopathy:    He has no cervical adenopathy.  Neurological: He is alert and oriented to person, place, and time. He has normal reflexes. No cranial nerve deficit. He exhibits normal muscle tone. Coordination normal.  Skin: Skin is warm and dry. No rash noted.  Psychiatric: He has a normal mood and affect. His behavior is normal. Judgment and thought content normal.   Lab Results  Component Value Date   WBC 5.5 09/04/2011   HGB 13.5 09/04/2011   HCT 40.7 09/04/2011   PLT 199.0 09/04/2011   GLUCOSE 101* 05/13/2012   CHOL 155 01/07/2010   TRIG 113.0 01/07/2010   HDL 35.00* 01/07/2010   LDLDIRECT 189.0 01/28/2009   LDLCALC 97 01/07/2010   ALT 14 09/04/2011   AST 22 09/04/2011   NA 140 05/13/2012   K 3.8 05/13/2012   CL 107 05/13/2012   CREATININE 0.9 05/13/2012   BUN 19 05/13/2012   CO2 26 05/13/2012   TSH 3.23 12/19/2010   PSA 2.38 04/08/2007   INR 1.20 11/03/2010           Assessment & Plan:

## 2012-06-12 ENCOUNTER — Other Ambulatory Visit: Payer: Self-pay | Admitting: Internal Medicine

## 2012-08-08 ENCOUNTER — Encounter: Payer: Self-pay | Admitting: Internal Medicine

## 2012-08-08 ENCOUNTER — Other Ambulatory Visit (INDEPENDENT_AMBULATORY_CARE_PROVIDER_SITE_OTHER): Payer: Medicare PPO

## 2012-08-08 ENCOUNTER — Ambulatory Visit (INDEPENDENT_AMBULATORY_CARE_PROVIDER_SITE_OTHER): Payer: Medicare PPO | Admitting: Internal Medicine

## 2012-08-08 VITALS — BP 138/80 | HR 80 | Temp 97.4°F | Resp 16 | Wt 203.0 lb

## 2012-08-08 DIAGNOSIS — R279 Unspecified lack of coordination: Secondary | ICD-10-CM

## 2012-08-08 DIAGNOSIS — R634 Abnormal weight loss: Secondary | ICD-10-CM

## 2012-08-08 DIAGNOSIS — E785 Hyperlipidemia, unspecified: Secondary | ICD-10-CM

## 2012-08-08 DIAGNOSIS — I2510 Atherosclerotic heart disease of native coronary artery without angina pectoris: Secondary | ICD-10-CM

## 2012-08-08 DIAGNOSIS — G609 Hereditary and idiopathic neuropathy, unspecified: Secondary | ICD-10-CM

## 2012-08-08 DIAGNOSIS — R270 Ataxia, unspecified: Secondary | ICD-10-CM

## 2012-08-08 DIAGNOSIS — I10 Essential (primary) hypertension: Secondary | ICD-10-CM

## 2012-08-08 DIAGNOSIS — E538 Deficiency of other specified B group vitamins: Secondary | ICD-10-CM

## 2012-08-08 DIAGNOSIS — E039 Hypothyroidism, unspecified: Secondary | ICD-10-CM

## 2012-08-08 DIAGNOSIS — G5793 Unspecified mononeuropathy of bilateral lower limbs: Secondary | ICD-10-CM | POA: Insufficient documentation

## 2012-08-08 LAB — CBC WITH DIFFERENTIAL/PLATELET
Basophils Absolute: 0 10*3/uL (ref 0.0–0.1)
Basophils Relative: 0.4 % (ref 0.0–3.0)
Eosinophils Absolute: 0.1 10*3/uL (ref 0.0–0.7)
HCT: 38.2 % — ABNORMAL LOW (ref 39.0–52.0)
Hemoglobin: 12.7 g/dL — ABNORMAL LOW (ref 13.0–17.0)
Lymphocytes Relative: 27.2 % (ref 12.0–46.0)
Lymphs Abs: 1.6 10*3/uL (ref 0.7–4.0)
MCHC: 33.3 g/dL (ref 30.0–36.0)
MCV: 93 fl (ref 78.0–100.0)
Monocytes Absolute: 0.5 10*3/uL (ref 0.1–1.0)
Neutro Abs: 3.5 10*3/uL (ref 1.4–7.7)
RBC: 4.11 Mil/uL — ABNORMAL LOW (ref 4.22–5.81)
RDW: 16 % — ABNORMAL HIGH (ref 11.5–14.6)

## 2012-08-08 LAB — BASIC METABOLIC PANEL
BUN: 21 mg/dL (ref 6–23)
Calcium: 9.2 mg/dL (ref 8.4–10.5)
Creatinine, Ser: 0.9 mg/dL (ref 0.4–1.5)
GFR: 84.08 mL/min (ref >60.00)

## 2012-08-08 NOTE — Assessment & Plan Note (Signed)
Continue with current prescription therapy as reflected on the Med list.  

## 2012-08-08 NOTE — Assessment & Plan Note (Signed)
Chronic due to frost bite in [place]

## 2012-08-08 NOTE — Assessment & Plan Note (Signed)
Wt Readings from Last 3 Encounters:  08/08/12 203 lb (92.08 kg)  05/14/12 204 lb (92.534 kg)  02/12/12 198 lb (89.812 kg)

## 2012-08-08 NOTE — Progress Notes (Signed)
    Subjective:    HPI  The patient presents for a follow-up of  chronic hypertension, chronic dyslipidemia,CAD controlled with medicines. C/o balance issues - not new...  He is a blood donor  BP Readings from Last 3 Encounters:  08/08/12 138/80  05/14/12 138/72  02/12/12 138/80     Wt Readings from Last 3 Encounters:  08/08/12 203 lb (92.08 kg)  05/14/12 204 lb (92.534 kg)  02/12/12 198 lb (89.812 kg)     Review of Systems  Constitutional: Positive for fatigue. Negative for appetite change and unexpected weight change.  HENT: Negative for nosebleeds, congestion, sore throat, sneezing, trouble swallowing and neck pain.   Eyes: Negative for itching and visual disturbance.  Respiratory: Negative for cough.   Cardiovascular: Negative for palpitations and leg swelling.  Gastrointestinal: Negative for diarrhea, blood in stool and abdominal distention.  Genitourinary: Negative for frequency.  Musculoskeletal: Positive for arthralgias and gait problem. Negative for joint swelling.       R knee hurts  Skin: Negative for rash.  Neurological: Negative for dizziness, tremors and speech difficulty.  Psychiatric/Behavioral: Positive for self-injury. Negative for sleep disturbance, dysphoric mood and agitation. The patient is not nervous/anxious.          Objective:   Physical Exam  Constitutional: He is oriented to person, place, and time. He appears well-developed.  HENT:  Mouth/Throat: Oropharynx is clear and moist.  Eyes: Conjunctivae are normal. Pupils are equal, round, and reactive to light.  Neck: Normal range of motion. No JVD present. No thyromegaly present.  Cardiovascular: Normal rate, regular rhythm, normal heart sounds and intact distal pulses.  Exam reveals no gallop and no friction rub.   No murmur heard. Pulmonary/Chest: Effort normal and breath sounds normal. No respiratory distress. He has no wheezes. He has no rales. He exhibits tenderness (R anterolat).   Abdominal: Soft. Bowel sounds are normal. He exhibits no distension and no mass. There is no tenderness. There is no rebound and no guarding.  Musculoskeletal: Normal range of motion. He exhibits tenderness. He exhibits no edema.  R knee is tender in the dist IT band section  Lymphadenopathy:    He has no cervical adenopathy.  Neurological: He is alert and oriented to person, place, and time. He has normal reflexes. No cranial nerve deficit. He exhibits normal muscle tone. Coordination normal.  Skin: Skin is warm and dry. No rash noted.  Psychiatric: He has a normal mood and affect. His behavior is normal. Judgment and thought content normal.   Lab Results  Component Value Date   WBC 5.5 09/04/2011   HGB 13.5 09/04/2011   HCT 40.7 09/04/2011   PLT 199.0 09/04/2011   GLUCOSE 101* 05/13/2012   CHOL 155 01/07/2010   TRIG 113.0 01/07/2010   HDL 35.00* 01/07/2010   LDLDIRECT 189.0 01/28/2009   LDLCALC 97 01/07/2010   ALT 14 09/04/2011   AST 22 09/04/2011   NA 140 05/13/2012   K 3.8 05/13/2012   CL 107 05/13/2012   CREATININE 0.9 05/13/2012   BUN 19 05/13/2012   CO2 26 05/13/2012   TSH 3.23 12/19/2010   PSA 2.38 04/08/2007   INR 1.20 11/03/2010           Assessment & Plan:

## 2012-08-08 NOTE — Patient Instructions (Signed)
Balance exercises 

## 2012-08-08 NOTE — Assessment & Plan Note (Signed)
Multifactorial  Balance exercises

## 2012-09-05 ENCOUNTER — Other Ambulatory Visit: Payer: Self-pay | Admitting: Neurosurgery

## 2012-09-05 DIAGNOSIS — M545 Low back pain: Secondary | ICD-10-CM

## 2012-09-13 ENCOUNTER — Ambulatory Visit
Admission: RE | Admit: 2012-09-13 | Discharge: 2012-09-13 | Disposition: A | Discharge status: Home / Self Care | Payer: Medicare PPO | Source: Ambulatory Visit | Attending: Neurosurgery | Admitting: Neurosurgery

## 2012-09-13 VITALS — BP 122/65 | HR 57

## 2012-09-13 DIAGNOSIS — M545 Low back pain: Secondary | ICD-10-CM

## 2012-09-13 DIAGNOSIS — IMO0001 Reserved for inherently not codable concepts without codable children: Secondary | ICD-10-CM

## 2012-09-13 MED ORDER — IOHEXOL 180 MG/ML  SOLN
18.0000 mL | Freq: Once | INTRAMUSCULAR | Status: AC | PRN
  Administered 2012-09-13: 18 mL via INTRATHECAL

## 2012-09-17 ENCOUNTER — Other Ambulatory Visit: Payer: Self-pay | Admitting: Neurosurgery

## 2012-09-17 DIAGNOSIS — M5136 Other intervertebral disc degeneration, lumbar region: Secondary | ICD-10-CM

## 2012-09-20 ENCOUNTER — Ambulatory Visit
Admission: RE | Admit: 2012-09-20 | Discharge: 2012-09-20 | Disposition: A | Discharge status: Home / Self Care | Payer: Medicare PPO | Source: Ambulatory Visit | Attending: Neurosurgery | Admitting: Neurosurgery

## 2012-09-20 ENCOUNTER — Other Ambulatory Visit: Payer: Self-pay | Admitting: Neurosurgery

## 2012-09-20 DIAGNOSIS — M5136 Other intervertebral disc degeneration, lumbar region: Secondary | ICD-10-CM

## 2012-09-20 MED ORDER — IOHEXOL 180 MG/ML  SOLN
1.0000 mL | Freq: Once | INTRAMUSCULAR | Status: AC | PRN
  Administered 2012-09-20: 1 mL via EPIDURAL

## 2012-09-20 MED ORDER — METHYLPREDNISOLONE ACETATE 40 MG/ML INJ SUSP (RADIOLOG
120.0000 mg | Freq: Once | INTRAMUSCULAR | Status: AC
  Administered 2012-09-20: 120 mg via EPIDURAL

## 2012-09-23 ENCOUNTER — Other Ambulatory Visit: Payer: Self-pay | Admitting: Internal Medicine

## 2012-10-04 ENCOUNTER — Other Ambulatory Visit: Payer: Self-pay | Admitting: Neurosurgery

## 2012-10-04 DIAGNOSIS — M545 Low back pain: Secondary | ICD-10-CM

## 2012-10-09 ENCOUNTER — Ambulatory Visit
Admission: RE | Admit: 2012-10-09 | Discharge: 2012-10-09 | Disposition: A | Discharge status: Home / Self Care | Payer: Medicare PPO | Source: Ambulatory Visit | Attending: Neurosurgery | Admitting: Neurosurgery

## 2012-10-09 ENCOUNTER — Other Ambulatory Visit: Payer: Self-pay | Admitting: Neurosurgery

## 2012-10-09 VITALS — BP 179/83 | HR 59

## 2012-10-09 DIAGNOSIS — M545 Low back pain: Secondary | ICD-10-CM

## 2012-10-09 MED ORDER — GADOBENATE DIMEGLUMINE 529 MG/ML IV SOLN
1.0000 mL | Freq: Once | INTRAVENOUS | Status: AC | PRN
  Administered 2012-10-09: 1 mL via INTRAVENOUS

## 2012-10-09 MED ORDER — METHYLPREDNISOLONE ACETATE 40 MG/ML INJ SUSP (RADIOLOG
120.0000 mg | Freq: Once | INTRAMUSCULAR | Status: AC
  Administered 2012-10-09: 120 mg via EPIDURAL

## 2012-11-13 ENCOUNTER — Ambulatory Visit (INDEPENDENT_AMBULATORY_CARE_PROVIDER_SITE_OTHER): Payer: Medicare PPO | Admitting: Internal Medicine

## 2012-11-13 ENCOUNTER — Encounter: Payer: Self-pay | Admitting: Internal Medicine

## 2012-11-13 VITALS — BP 142/88 | Temp 97.4°F | Wt 199.0 lb

## 2012-11-13 DIAGNOSIS — I10 Essential (primary) hypertension: Secondary | ICD-10-CM

## 2012-11-13 DIAGNOSIS — M25561 Pain in right knee: Secondary | ICD-10-CM

## 2012-11-13 DIAGNOSIS — M25569 Pain in unspecified knee: Secondary | ICD-10-CM

## 2012-11-13 DIAGNOSIS — I2510 Atherosclerotic heart disease of native coronary artery without angina pectoris: Secondary | ICD-10-CM

## 2012-11-13 DIAGNOSIS — Z23 Encounter for immunization: Secondary | ICD-10-CM

## 2012-11-13 DIAGNOSIS — E538 Deficiency of other specified B group vitamins: Secondary | ICD-10-CM

## 2012-11-13 DIAGNOSIS — R279 Unspecified lack of coordination: Secondary | ICD-10-CM

## 2012-11-13 DIAGNOSIS — R270 Ataxia, unspecified: Secondary | ICD-10-CM

## 2012-11-13 DIAGNOSIS — R634 Abnormal weight loss: Secondary | ICD-10-CM

## 2012-11-13 MED ORDER — TRAMADOL HCL 50 MG PO TABS: 50.0000 mg | ORAL_TABLET | Freq: Two times a day (BID) | ORAL | Status: DC | PRN

## 2012-11-13 NOTE — Assessment & Plan Note (Signed)
Stress at home - taking care of his sick wife HH called

## 2012-11-13 NOTE — Assessment & Plan Note (Signed)
Continue with current prescription therapy as reflected on the Med list.  

## 2012-11-13 NOTE — Assessment & Plan Note (Signed)
Dr Charlann Boxer 2014 needs TKR Tramadol prn

## 2012-11-13 NOTE — Assessment & Plan Note (Signed)
Not worse.  °

## 2012-11-13 NOTE — Progress Notes (Signed)
    Subjective:    HPI  The patient presents for a follow-up of  chronic hypertension, chronic dyslipidemia,CAD controlled with medicines. C/o R knee pain - worse. F/u balance issues - not new...  He is a blood donor  BP Readings from Last 3 Encounters:  11/13/12 142/88  10/09/12 179/83  09/20/12 150/76     Wt Readings from Last 3 Encounters:  11/13/12 199 lb (90.266 kg)  08/08/12 203 lb (92.08 kg)  05/14/12 204 lb (92.534 kg)     Review of Systems  Constitutional: Positive for fatigue. Negative for appetite change and unexpected weight change.  HENT: Negative for nosebleeds, congestion, sore throat, sneezing, trouble swallowing and neck pain.   Eyes: Negative for itching and visual disturbance.  Respiratory: Negative for cough.   Cardiovascular: Negative for palpitations and leg swelling.  Gastrointestinal: Negative for diarrhea, blood in stool and abdominal distention.  Genitourinary: Negative for frequency.  Musculoskeletal: Positive for arthralgias and gait problem. Negative for joint swelling.       R knee hurts  Skin: Negative for rash.  Neurological: Negative for dizziness, tremors and speech difficulty.  Psychiatric/Behavioral: Positive for self-injury. Negative for sleep disturbance, dysphoric mood and agitation. The patient is not nervous/anxious.          Objective:   Physical Exam  Constitutional: He is oriented to person, place, and time. He appears well-developed.  HENT:  Mouth/Throat: Oropharynx is clear and moist.  Eyes: Conjunctivae are normal. Pupils are equal, round, and reactive to light.  Neck: Normal range of motion. No JVD present. No thyromegaly present.  Cardiovascular: Normal rate, regular rhythm, normal heart sounds and intact distal pulses.  Exam reveals no gallop and no friction rub.   No murmur heard. Pulmonary/Chest: Effort normal and breath sounds normal. No respiratory distress. He has no wheezes. He has no rales. He exhibits  tenderness (R anterolat).  Abdominal: Soft. Bowel sounds are normal. He exhibits no distension and no mass. There is no tenderness. There is no rebound and no guarding.  Musculoskeletal: Normal range of motion. He exhibits tenderness. He exhibits no edema.  R knee is tender in the dist IT band section  Lymphadenopathy:    He has no cervical adenopathy.  Neurological: He is alert and oriented to person, place, and time. He has normal reflexes. No cranial nerve deficit. He exhibits normal muscle tone. Coordination normal.  Skin: Skin is warm and dry. No rash noted.  Psychiatric: He has a normal mood and affect. His behavior is normal. Judgment and thought content normal.   Lab Results  Component Value Date   WBC 5.8 08/08/2012   HGB 12.7* 08/08/2012   HCT 38.2* 08/08/2012   PLT 228.0 08/08/2012   GLUCOSE 84 08/08/2012   CHOL 155 01/07/2010   TRIG 113.0 01/07/2010   HDL 35.00* 01/07/2010   LDLDIRECT 189.0 01/28/2009   LDLCALC 97 01/07/2010   ALT 14 09/04/2011   AST 22 09/04/2011   NA 141 08/08/2012   K 4.0 08/08/2012   CL 109 08/08/2012   CREATININE 0.9 08/08/2012   BUN 21 08/08/2012   CO2 29 08/08/2012   TSH 3.23 12/19/2010   PSA 2.38 04/08/2007   INR 1.20 11/03/2010           Assessment & Plan:

## 2012-12-06 ENCOUNTER — Ambulatory Visit (INDEPENDENT_AMBULATORY_CARE_PROVIDER_SITE_OTHER)
Admission: RE | Admit: 2012-12-06 | Discharge: 2012-12-06 | Disposition: A | Discharge status: Home / Self Care | Payer: Medicare PPO | Source: Ambulatory Visit | Attending: Internal Medicine | Admitting: Internal Medicine

## 2012-12-06 ENCOUNTER — Ambulatory Visit (INDEPENDENT_AMBULATORY_CARE_PROVIDER_SITE_OTHER): Payer: Medicare PPO | Admitting: Internal Medicine

## 2012-12-06 ENCOUNTER — Encounter: Payer: Self-pay | Admitting: Internal Medicine

## 2012-12-06 VITALS — BP 130/82 | HR 72 | Temp 98.2°F | Resp 12 | Wt 195.0 lb

## 2012-12-06 DIAGNOSIS — S0083XA Contusion of other part of head, initial encounter: Secondary | ICD-10-CM | POA: Insufficient documentation

## 2012-12-06 DIAGNOSIS — S0003XA Contusion of scalp, initial encounter: Secondary | ICD-10-CM

## 2012-12-06 DIAGNOSIS — J324 Chronic pansinusitis: Secondary | ICD-10-CM | POA: Insufficient documentation

## 2012-12-06 DIAGNOSIS — J329 Chronic sinusitis, unspecified: Secondary | ICD-10-CM

## 2012-12-06 MED ORDER — AMOXICILLIN 875 MG PO TABS: 875.0000 mg | ORAL_TABLET | Freq: Two times a day (BID) | ORAL | Status: DC

## 2012-12-06 NOTE — Assessment & Plan Note (Signed)
Amoxicillin x10d 

## 2012-12-06 NOTE — Assessment & Plan Note (Signed)
CT Warm compress to hematomas

## 2012-12-06 NOTE — Progress Notes (Signed)
    Subjective:    HPI  The patient presents for a follow-up of  chronic hypertension, chronic dyslipidemia,CAD controlled with medicines. C/o R knee pain - worse. F/u balance issues - not new... He fell 2 wks ago and hit his head - forehead was bruised C/o "cold" sx's Barry Molina is at [facility] now He was a blood donor  BP Readings from Last 3 Encounters:  12/06/12 130/82  11/13/12 142/88  10/09/12 179/83     Wt Readings from Last 3 Encounters:  12/06/12 195 lb (88.451 kg)  11/13/12 199 lb (90.266 kg)  08/08/12 203 lb (92.08 kg)     Review of Systems  Constitutional: Positive for fatigue. Negative for appetite change and unexpected weight change.  HENT: Negative for congestion, nosebleeds, sneezing, sore throat and trouble swallowing.   Eyes: Negative for itching and visual disturbance.  Respiratory: Negative for cough.   Cardiovascular: Negative for palpitations and leg swelling.  Gastrointestinal: Negative for diarrhea, blood in stool and abdominal distention.  Genitourinary: Negative for frequency.  Musculoskeletal: Positive for arthralgias and gait problem. Negative for joint swelling and neck pain.       R knee hurts  Skin: Negative for rash.  Neurological: Negative for dizziness, tremors and speech difficulty.  Psychiatric/Behavioral: Positive for self-injury. Negative for sleep disturbance, dysphoric mood and agitation. The patient is not nervous/anxious.          Objective:   Physical Exam  Constitutional: He is oriented to person, place, and time. He appears well-developed.  HENT:  Mouth/Throat: Oropharynx is clear and moist.  Eyes: Conjunctivae are normal. Pupils are equal, round, and reactive to light.  Neck: Normal range of motion. No JVD present. No thyromegaly present.  Cardiovascular: Normal rate, regular rhythm, normal heart sounds and intact distal pulses.  Exam reveals no gallop and no friction rub.   No murmur heard. Pulmonary/Chest: Effort  normal and breath sounds normal. No respiratory distress. He has no wheezes. He has no rales. He exhibits tenderness (R anterolat).  Abdominal: Soft. Bowel sounds are normal. He exhibits no distension and no mass. There is no tenderness. There is no rebound and no guarding.  Musculoskeletal: Normal range of motion. He exhibits tenderness. He exhibits no edema.  R knee is tender in the dist IT band section  Lymphadenopathy:    He has no cervical adenopathy.  Neurological: He is alert and oriented to person, place, and time. He has normal reflexes. No cranial nerve deficit. He exhibits normal muscle tone. Coordination normal.  Skin: Skin is warm and dry. No rash noted.  Psychiatric: He has a normal mood and affect. His behavior is normal. Judgment and thought content normal.  L face is bruised>R  Lab Results  Component Value Date   WBC 5.8 08/08/2012   HGB 12.7* 08/08/2012   HCT 38.2* 08/08/2012   PLT 228.0 08/08/2012   GLUCOSE 84 08/08/2012   CHOL 155 01/07/2010   TRIG 113.0 01/07/2010   HDL 35.00* 01/07/2010   LDLDIRECT 189.0 01/28/2009   LDLCALC 97 01/07/2010   ALT 14 09/04/2011   AST 22 09/04/2011   NA 141 08/08/2012   K 4.0 08/08/2012   CL 109 08/08/2012   CREATININE 0.9 08/08/2012   BUN 21 08/08/2012   CO2 29 08/08/2012   TSH 3.23 12/19/2010   PSA 2.38 04/08/2007   INR 1.20 11/03/2010           Assessment & Plan:

## 2013-02-12 ENCOUNTER — Ambulatory Visit: Payer: Medicare PPO | Admitting: Internal Medicine

## 2013-02-24 ENCOUNTER — Telehealth: Payer: Self-pay | Admitting: *Deleted

## 2013-02-24 MED ORDER — AZITHROMYCIN 250 MG PO TABS: ORAL_TABLET | ORAL | Status: DC

## 2013-02-24 MED ORDER — PROMETHAZINE-CODEINE 6.25-10 MG/5ML PO SYRP: 5.0000 mL | ORAL_SOLUTION | ORAL | Status: DC | PRN

## 2013-02-24 NOTE — Telephone Encounter (Signed)
C/o cough, sore throat, congestion x 2 days. Pt is requesting abx and cough meds. Please advise.

## 2013-02-24 NOTE — Telephone Encounter (Signed)
Ok Zpac and Prom - cod syr Thx

## 2013-02-25 NOTE — Telephone Encounter (Signed)
Done. Pt informed.

## 2013-03-27 ENCOUNTER — Other Ambulatory Visit: Payer: Self-pay | Admitting: Internal Medicine

## 2013-03-31 ENCOUNTER — Encounter: Payer: Self-pay | Admitting: Internal Medicine

## 2013-03-31 ENCOUNTER — Ambulatory Visit (INDEPENDENT_AMBULATORY_CARE_PROVIDER_SITE_OTHER): Payer: Medicare PPO | Admitting: Internal Medicine

## 2013-03-31 ENCOUNTER — Other Ambulatory Visit (INDEPENDENT_AMBULATORY_CARE_PROVIDER_SITE_OTHER): Payer: Medicare PPO

## 2013-03-31 VITALS — BP 140/86 | HR 76 | Temp 97.0°F | Resp 16 | Wt 195.0 lb

## 2013-03-31 DIAGNOSIS — I2510 Atherosclerotic heart disease of native coronary artery without angina pectoris: Secondary | ICD-10-CM

## 2013-03-31 DIAGNOSIS — R634 Abnormal weight loss: Secondary | ICD-10-CM

## 2013-03-31 DIAGNOSIS — R279 Unspecified lack of coordination: Secondary | ICD-10-CM

## 2013-03-31 DIAGNOSIS — E538 Deficiency of other specified B group vitamins: Secondary | ICD-10-CM

## 2013-03-31 DIAGNOSIS — I10 Essential (primary) hypertension: Secondary | ICD-10-CM

## 2013-03-31 DIAGNOSIS — R270 Ataxia, unspecified: Secondary | ICD-10-CM

## 2013-03-31 DIAGNOSIS — Z23 Encounter for immunization: Secondary | ICD-10-CM

## 2013-03-31 LAB — BASIC METABOLIC PANEL
BUN: 21 mg/dL (ref 6–23)
CO2: 27 mEq/L (ref 19–32)
CREATININE: 0.9 mg/dL (ref 0.4–1.5)
Calcium: 9.1 mg/dL (ref 8.4–10.5)
Chloride: 108 mEq/L (ref 96–112)
GFR: 81.87 mL/min (ref >60.00)
GLUCOSE: 90 mg/dL (ref 70–99)
Potassium: 4.1 mEq/L (ref 3.5–5.1)
Sodium: 141 mEq/L (ref 135–145)

## 2013-03-31 LAB — CBC WITH DIFFERENTIAL/PLATELET
BASOS ABS: 0.1 10*3/uL (ref 0.0–0.1)
Basophils Relative: 1 % (ref 0.0–3.0)
EOS PCT: 1.4 % (ref 0.0–5.0)
Eosinophils Absolute: 0.1 10*3/uL (ref 0.0–0.7)
HCT: 41.4 % (ref 39.0–52.0)
Hemoglobin: 13.5 g/dL (ref 13.0–17.0)
Lymphocytes Relative: 24 % (ref 12.0–46.0)
Lymphs Abs: 2.3 10*3/uL (ref 0.7–4.0)
MCHC: 32.6 g/dL (ref 30.0–36.0)
MCV: 95.5 fl (ref 78.0–100.0)
MONO ABS: 0.5 10*3/uL (ref 0.1–1.0)
MONOS PCT: 5.8 % (ref 3.0–12.0)
Neutro Abs: 6.4 10*3/uL (ref 1.4–7.7)
Neutrophils Relative %: 67.8 % (ref 43.0–77.0)
PLATELETS: 220 10*3/uL (ref 150.0–400.0)
RBC: 4.34 Mil/uL (ref 4.22–5.81)
RDW: 14.5 % (ref 11.5–14.6)
WBC: 9.4 10*3/uL (ref 4.5–10.5)

## 2013-03-31 NOTE — Progress Notes (Signed)
    Subjective:    HPI  The patient presents for a follow-up of  chronic hypertension, chronic dyslipidemia,CAD controlled with medicines. F/u R knee pain - better. F/u balance issues - not new... He fell 2 wks ago and hit his head - forehead was bruised Barry Molina is at [employer] in [place] now He was a blood donor  BP Readings from Last 3 Encounters:  03/31/13 140/86  12/06/12 130/82  11/13/12 142/88     Wt Readings from Last 3 Encounters:  03/31/13 195 lb (88.451 kg)  12/06/12 195 lb (88.451 kg)  11/13/12 199 lb (90.266 kg)     Review of Systems  Constitutional: Positive for fatigue. Negative for appetite change and unexpected weight change.  HENT: Negative for congestion, nosebleeds, sneezing, sore throat and trouble swallowing.   Eyes: Negative for itching and visual disturbance.  Respiratory: Negative for cough.   Cardiovascular: Negative for palpitations and leg swelling.  Gastrointestinal: Negative for diarrhea, blood in stool and abdominal distention.  Genitourinary: Negative for frequency.  Musculoskeletal: Positive for arthralgias and gait problem. Negative for joint swelling and neck pain.       R knee hurts  Skin: Negative for rash.  Neurological: Negative for dizziness, tremors and speech difficulty.  Psychiatric/Behavioral: Positive for self-injury. Negative for sleep disturbance, dysphoric mood and agitation. The patient is not nervous/anxious.          Objective:   Physical Exam  Constitutional: He is oriented to person, place, and time. He appears well-developed.  HENT:  Mouth/Throat: Oropharynx is clear and moist.  Eyes: Conjunctivae are normal. Pupils are equal, round, and reactive to light.  Neck: Normal range of motion. No JVD present. No thyromegaly present.  Cardiovascular: Normal rate, regular rhythm, normal heart sounds and intact distal pulses.  Exam reveals no gallop and no friction rub.   No murmur heard. Pulmonary/Chest: Effort  normal and breath sounds normal. No respiratory distress. He has no wheezes. He has no rales. He exhibits tenderness (R anterolat).  Abdominal: Soft. Bowel sounds are normal. He exhibits no distension and no mass. There is no tenderness. There is no rebound and no guarding.  Musculoskeletal: Normal range of motion. He exhibits tenderness. He exhibits no edema.  R knee is tender in the dist IT band section  Lymphadenopathy:    He has no cervical adenopathy.  Neurological: He is alert and oriented to person, place, and time. He has normal reflexes. No cranial nerve deficit. He exhibits normal muscle tone. Coordination normal.  Skin: Skin is warm and dry. No rash noted.  Psychiatric: He has a normal mood and affect. His behavior is normal. Judgment and thought content normal.    Lab Results  Component Value Date   WBC 5.8 08/08/2012   HGB 12.7* 08/08/2012   HCT 38.2* 08/08/2012   PLT 228.0 08/08/2012   GLUCOSE 84 08/08/2012   CHOL 155 01/07/2010   TRIG 113.0 01/07/2010   HDL 35.00* 01/07/2010   LDLDIRECT 189.0 01/28/2009   LDLCALC 97 01/07/2010   ALT 14 09/04/2011   AST 22 09/04/2011   NA 141 08/08/2012   K 4.0 08/08/2012   CL 109 08/08/2012   CREATININE 0.9 08/08/2012   BUN 21 08/08/2012   CO2 29 08/08/2012   TSH 3.23 12/19/2010   PSA 2.38 04/08/2007   INR 1.20 11/03/2010           Assessment & Plan:

## 2013-03-31 NOTE — Assessment & Plan Note (Signed)
Continue with current prescription therapy as reflected on the Med list.  

## 2013-03-31 NOTE — Assessment & Plan Note (Signed)
Barry Molina is at [employer] in [place] now

## 2013-03-31 NOTE — Progress Notes (Signed)
Pre visit review using our clinic review tool, if applicable. No additional management support is needed unless otherwise documented below in the visit note. 

## 2013-04-25 ENCOUNTER — Other Ambulatory Visit: Payer: Self-pay | Admitting: Internal Medicine

## 2013-05-28 ENCOUNTER — Ambulatory Visit (INDEPENDENT_AMBULATORY_CARE_PROVIDER_SITE_OTHER): Payer: Medicare PPO | Admitting: Internal Medicine

## 2013-05-28 ENCOUNTER — Ambulatory Visit (INDEPENDENT_AMBULATORY_CARE_PROVIDER_SITE_OTHER)
Admission: RE | Admit: 2013-05-28 | Discharge: 2013-05-28 | Disposition: A | Discharge status: Home / Self Care | Payer: Medicare PPO | Source: Ambulatory Visit | Attending: Internal Medicine | Admitting: Internal Medicine

## 2013-05-28 ENCOUNTER — Encounter: Payer: Self-pay | Admitting: Internal Medicine

## 2013-05-28 VITALS — BP 160/88 | HR 80 | Temp 97.6°F | Resp 16 | Wt 198.0 lb

## 2013-05-28 DIAGNOSIS — R279 Unspecified lack of coordination: Secondary | ICD-10-CM

## 2013-05-28 DIAGNOSIS — M542 Cervicalgia: Secondary | ICD-10-CM

## 2013-05-28 DIAGNOSIS — S1093XA Contusion of unspecified part of neck, initial encounter: Secondary | ICD-10-CM

## 2013-05-28 DIAGNOSIS — S0083XA Contusion of other part of head, initial encounter: Secondary | ICD-10-CM

## 2013-05-28 DIAGNOSIS — Y92009 Unspecified place in unspecified non-institutional (private) residence as the place of occurrence of the external cause: Secondary | ICD-10-CM

## 2013-05-28 DIAGNOSIS — W19XXXA Unspecified fall, initial encounter: Secondary | ICD-10-CM

## 2013-05-28 DIAGNOSIS — R270 Ataxia, unspecified: Secondary | ICD-10-CM

## 2013-05-28 DIAGNOSIS — S0003XA Contusion of scalp, initial encounter: Secondary | ICD-10-CM

## 2013-05-28 MED ORDER — TRAMADOL HCL 50 MG PO TABS: 50.0000 mg | ORAL_TABLET | Freq: Two times a day (BID) | ORAL | Status: DC | PRN

## 2013-05-28 NOTE — Assessment & Plan Note (Signed)
CT head 

## 2013-05-28 NOTE — Progress Notes (Signed)
Pre visit review using our clinic review tool, if applicable. No additional management support is needed unless otherwise documented below in the visit note. 

## 2013-05-28 NOTE — Assessment & Plan Note (Signed)
CT head/neck 

## 2013-05-28 NOTE — Patient Instructions (Signed)
Warm compress Dress wounds w/an antibiotic oinment

## 2013-06-01 ENCOUNTER — Encounter: Payer: Self-pay | Admitting: Internal Medicine

## 2013-06-01 NOTE — Assessment & Plan Note (Signed)
CT head/neck

## 2013-06-11 ENCOUNTER — Encounter: Payer: Self-pay | Admitting: Internal Medicine

## 2013-06-11 ENCOUNTER — Ambulatory Visit (INDEPENDENT_AMBULATORY_CARE_PROVIDER_SITE_OTHER): Payer: Medicare PPO | Admitting: Internal Medicine

## 2013-06-11 VITALS — BP 150/70 | HR 80 | Temp 97.4°F | Resp 16 | Wt 194.0 lb

## 2013-06-11 DIAGNOSIS — I10 Essential (primary) hypertension: Secondary | ICD-10-CM

## 2013-06-11 DIAGNOSIS — Y92009 Unspecified place in unspecified non-institutional (private) residence as the place of occurrence of the external cause: Secondary | ICD-10-CM

## 2013-06-11 DIAGNOSIS — W19XXXA Unspecified fall, initial encounter: Secondary | ICD-10-CM

## 2013-06-11 DIAGNOSIS — S1093XA Contusion of unspecified part of neck, initial encounter: Secondary | ICD-10-CM

## 2013-06-11 DIAGNOSIS — S0003XA Contusion of scalp, initial encounter: Secondary | ICD-10-CM

## 2013-06-11 DIAGNOSIS — S0083XA Contusion of other part of head, initial encounter: Secondary | ICD-10-CM

## 2013-06-11 DIAGNOSIS — E039 Hypothyroidism, unspecified: Secondary | ICD-10-CM

## 2013-06-11 MED ORDER — TRAMADOL HCL 50 MG PO TABS: 50.0000 mg | ORAL_TABLET | Freq: Two times a day (BID) | ORAL | Status: DC | PRN

## 2013-06-11 MED ORDER — METOPROLOL SUCCINATE ER 25 MG PO TB24: ORAL_TABLET | ORAL | Status: DC

## 2013-06-11 NOTE — Progress Notes (Signed)
Pre visit review using our clinic review tool, if applicable. No additional management support is needed unless otherwise documented below in the visit note. 

## 2013-06-11 NOTE — Assessment & Plan Note (Signed)
Using a cane 

## 2013-06-11 NOTE — Assessment & Plan Note (Signed)
Continue with current prescription therapy as reflected on the Med list.  

## 2013-06-11 NOTE — Assessment & Plan Note (Signed)
Resolving Warm compress on hematoma CT ok

## 2013-06-11 NOTE — Progress Notes (Signed)
Subjective:    HPI F/u a fall   The patient presents for a follow-up of  chronic hypertension, chronic dyslipidemia,CAD controlled with medicines. C/o R knee pain - worse. F/u balance issues - not new... He fell 2 wks ago and hit his head - forehead was bruised Mrs Thor is back  at home now He was a blood donor, not now  BP Readings from Last 3 Encounters:  06/11/13 150/70  05/28/13 160/88  03/31/13 140/86     Wt Readings from Last 3 Encounters:  06/11/13 194 lb (87.998 kg)  05/28/13 198 lb (89.812 kg)  03/31/13 195 lb (88.451 kg)     Review of Systems  Constitutional: Positive for fatigue. Negative for appetite change and unexpected weight change.  HENT: Negative for congestion, nosebleeds, sneezing, sore throat and trouble swallowing.   Eyes: Negative for itching and visual disturbance.  Respiratory: Negative for cough.   Cardiovascular: Negative for palpitations and leg swelling.  Gastrointestinal: Negative for diarrhea, blood in stool and abdominal distention.  Genitourinary: Negative for frequency.  Musculoskeletal: Positive for arthralgias and gait problem. Negative for joint swelling and neck pain.       R knee hurts  Skin: Negative for rash.  Neurological: Negative for dizziness, tremors and speech difficulty.  Psychiatric/Behavioral: Positive for self-injury. Negative for sleep disturbance, dysphoric mood and agitation. The patient is not nervous/anxious.          Objective:   Physical Exam  Constitutional: He is oriented to person, place, and time. He appears well-developed.  HENT:  Mouth/Throat: Oropharynx is clear and moist.  Eyes: Conjunctivae are normal. Pupils are equal, round, and reactive to light.  Neck: Normal range of motion. No JVD present. No thyromegaly present.  Cardiovascular: Normal rate, regular rhythm, normal heart sounds and intact distal pulses.  Exam reveals no gallop and no friction rub.   No murmur heard. Pulmonary/Chest:  Effort normal and breath sounds normal. No respiratory distress. He has no wheezes. He has no rales. He exhibits tenderness (R anterolat).  Abdominal: Soft. Bowel sounds are normal. He exhibits no distension and no mass. There is no tenderness. There is no rebound and no guarding.  Musculoskeletal: Normal range of motion. He exhibits tenderness. He exhibits no edema.  R knee is tender in the dist IT band section  Lymphadenopathy:    He has no cervical adenopathy.  Neurological: He is alert and oriented to person, place, and time. He has normal reflexes. No cranial nerve deficit. He exhibits normal muscle tone. Coordination normal.  Skin: Skin is warm and dry. No rash noted.  Psychiatric: He has a normal mood and affect. His behavior is normal. Judgment and thought content normal.   Face is bruised - better 1.3 cm hematoma on forehead Nose abrasion is resolving   Lab Results  Component Value Date   WBC 9.4 03/31/2013   HGB 13.5 03/31/2013   HCT 41.4 03/31/2013   PLT 220.0 03/31/2013   GLUCOSE 90 03/31/2013   CHOL 155 01/07/2010   TRIG 113.0 01/07/2010   HDL 35.00* 01/07/2010   LDLDIRECT 189.0 01/28/2009   LDLCALC 97 01/07/2010   ALT 14 09/04/2011   AST 22 09/04/2011   NA 141 03/31/2013   K 4.1 03/31/2013   CL 108 03/31/2013   CREATININE 0.9 03/31/2013   BUN 21 03/31/2013   CO2 27 03/31/2013   TSH 3.23 12/19/2010   PSA 2.38 04/08/2007   INR 1.20 11/03/2010  Assessment & Plan:

## 2013-12-26 ENCOUNTER — Other Ambulatory Visit: Payer: Self-pay | Admitting: Internal Medicine

## 2014-01-07 ENCOUNTER — Ambulatory Visit (INDEPENDENT_AMBULATORY_CARE_PROVIDER_SITE_OTHER): Payer: Medicare PPO | Admitting: Family Medicine

## 2014-01-07 ENCOUNTER — Other Ambulatory Visit (INDEPENDENT_AMBULATORY_CARE_PROVIDER_SITE_OTHER): Payer: Medicare PPO

## 2014-01-07 ENCOUNTER — Encounter: Payer: Self-pay | Admitting: Family Medicine

## 2014-01-07 VITALS — BP 134/82 | HR 64 | Wt 192.0 lb

## 2014-01-07 DIAGNOSIS — M6530 Trigger finger, unspecified finger: Secondary | ICD-10-CM

## 2014-01-07 DIAGNOSIS — M65321 Trigger finger, right index finger: Secondary | ICD-10-CM

## 2014-01-07 DIAGNOSIS — M25561 Pain in right knee: Secondary | ICD-10-CM

## 2014-01-07 NOTE — Patient Instructions (Signed)
Good to see you Ice is your friend at the end of the night Wear the brace at night for next 2 weeks.  Exercises 3 times a week.  See me again in 2 weeks if not better

## 2014-01-07 NOTE — Progress Notes (Signed)
[facility] [address] [place], [place] [address] Phone: [phone] Subjective:     CC: trigger finger and right knee pain.   [id number] Barry Molina is a 80 y.o. male coming in with complaint of 2 problems. Problem is patient has his index finger on the right hand seems to get stuck in a position. Can be very painful and the mornings. Does stop him from doing certain activities. Patient does ambulate with the aid of a cane and notices that sometimes it is hard to grasp the cane. Denies any numbness or swelling. Does have a past medical history significant for shrapnel from the [place]. Has never had full strength since that injury. Denies any numbness. Does affect daily activities. Does not wake him up at night.  Patient also has right knee pain. Patient has known bone-on-bone osteophytic changes of the knee and has seen another provider for previously. Patient has had steroid injections but multiple years ago for the last injection. Patient was told he would potentially need a knee replacement but did not have it and now provider states that due to his health and age she is not a candidate. Rates the severity of 7 out of 10. Worse with cold weather.     Past medical history, social, surgical and family history all reviewed in electronic medical record.   Review of Systems: No headache, visual changes, nausea, vomiting, diarrhea, constipation, dizziness, abdominal pain, skin rash, fevers, chills, night sweats, weight loss, swollen lymph nodes, body aches, joint swelling, muscle aches, chest pain, shortness of breath, mood changes.   Objective Blood pressure 134/82, pulse 64, weight 192 lb (87.091 kg), SpO2 95 %.  General: No apparent distress alert and oriented x3 mood and affect normal, dressed appropriately.  HEENT: Pupils equal, extraocular movements intact  Respiratory: Patient's speak in full sentences and does not appear short of breath    Cardiovascular: No lower extremity edema, non tender, no erythema  Skin: Warm dry intact with no signs of infection or rash on extremities or on axial skeleton.  Abdomen: Soft nontender  Neuro: Cranial nerves II through XII are intact, neurovascularly intact in all extremities with 2+ DTRs and 2+ pulses.  Lymph: No lymphadenopathy of posterior or anterior cervical chain or axillae bilaterally.  Gait antalgic gait MSK:  Non tender with full range of motion and good stability and symmetric strength and tone of shoulders, elbows, wrist, hip, and ankles bilaterally. Significant osteophytic changes of multiple joints. Skin exam shows the patient does have a trigger nodule noted at the A1 pulley of the index finger on the right hand. Patient also has atrophy of the thenar eminence bilaterally. Knee:right knee Significant osteophytic changes with mild valgus deformity Tender mostly over the medial joint line Lacks last 5 of extension as well as flexion Ligaments with solid consistent endpoints including ACL, PCL, LCL, MCL. painful patellar compression. Patellar glide with moderate crepitus. Patellar and quadriceps tendons unremarkable. Hamstring and quadriceps strength is normal.  Contralateral knee arthritic changes as well but non tender.   Limited musculoskeletal ultrasound was performed and interpreted by me today. Limited ultrasound showed patient did have a nodule noted at the A1 pulley. Mild hypoechoic changes within the tendon sheath.  Impression: Trigger finger index finger right hand  After verbal consent patient was prepped with alcohol swabs and with a 25-gauge half-inch needle was injected with 0.5 mL of 0.5% Marcaine 0.5 mL of Kenalog 49 g/dL within the tendon sheath under  ultrasound guidance. Patient tolerated the procedure well with no blood loss. Postinjection instructions given. Satisfactory ultrasound guided injection.     Impression and Recommendations:     This case  required medical decision making of moderate complexity.

## 2014-01-07 NOTE — Assessment & Plan Note (Signed)
Patient's right knee pain is likely secondary to osteophytic changes. We will not do an injection today but patient will do and icing protocol, home exercises and was given a brace for comfort. This was a medial unloader brace. Patient showed proper positioning. His lungs patient continues to do relatively well we'll continue to monitor. Patient has any worsening pain we can consider a steroid injections and he may be a candidate for viscous supplementation.

## 2014-01-07 NOTE — Assessment & Plan Note (Signed)
Patient did do significant amount with the injection. Discussed icing protocol. Discussed bracing at night. Patient will come back again in 2 weeks if not having any continued improvement.

## 2014-03-25 ENCOUNTER — Other Ambulatory Visit: Payer: Self-pay | Admitting: Internal Medicine

## 2014-04-23 ENCOUNTER — Other Ambulatory Visit (INDEPENDENT_AMBULATORY_CARE_PROVIDER_SITE_OTHER): Payer: Medicare PPO

## 2014-04-23 ENCOUNTER — Ambulatory Visit (INDEPENDENT_AMBULATORY_CARE_PROVIDER_SITE_OTHER): Payer: Medicare PPO | Admitting: Family Medicine

## 2014-04-23 ENCOUNTER — Encounter: Payer: Self-pay | Admitting: Family Medicine

## 2014-04-23 VITALS — BP 124/72 | HR 87

## 2014-04-23 DIAGNOSIS — M79602 Pain in left arm: Secondary | ICD-10-CM

## 2014-04-23 DIAGNOSIS — M12812 Other specific arthropathies, not elsewhere classified, left shoulder: Secondary | ICD-10-CM

## 2014-04-23 DIAGNOSIS — M75102 Unspecified rotator cuff tear or rupture of left shoulder, not specified as traumatic: Secondary | ICD-10-CM

## 2014-04-23 DIAGNOSIS — M12512 Traumatic arthropathy, left shoulder: Secondary | ICD-10-CM

## 2014-04-23 NOTE — Progress Notes (Signed)
[name] [address] [place], [place] [address] Phone: [phone] Subjective:     CC: trigger finger and right knee pain follow up New left side shoulder pain.    [id number] Barry Molina is a 81 y.o. male coming in with complaint of 2 problems. Problem is patient has his index finger and had a trigger figure. Patient states that it is significant better.  Patient also has right knee pain. She does have osteophytic changes of the right knee. Patient is doing well with conservative therapy.   He is also coming in with a new problem. Patient is coming in with left shoulder pain. Patient has a past [place] history significant for rotator cuff repair multiple decades ago. Patient states that it seems to hurt more on the lateral aspect of his arm. Patient states certain movements especially reaching across his body or behind his back can be very painful. Patient rates the severity of pain as 5 out of 10. Not stopping him from any activities but does make some activities of daily living very uncomfortable. Denies any nighttime awakening.    Past medical history, social, surgical and family history all reviewed in electronic medical record.   Review of Systems: No headache, visual changes, nausea, vomiting, diarrhea, constipation, dizziness, abdominal pain, skin rash, fevers, chills, night sweats, weight loss, swollen lymph nodes, body aches, joint swelling, muscle aches, chest pain, shortness of breath, mood changes.   Objective Blood pressure 124/72, pulse 87, SpO2 92 %.  General: No apparent distress alert and oriented x3 mood and affect normal, dressed appropriately.  HEENT: Pupils equal, extraocular movements intact  Respiratory: Patient's speak in full sentences and does not appear short of breath  Cardiovascular: No lower extremity edema, non tender, no erythema  Skin: Warm dry intact with no signs of infection or rash on extremities or on axial  skeleton.  Abdomen: Soft nontender  Neuro: Cranial nerves II through XII are intact, neurovascularly intact in all extremities with 2+ DTRs and 2+ pulses.  Lymph: No lymphadenopathy of posterior or anterior cervical chain or axillae bilaterally.  Gait antalgic gait MSK:  Non tender with full range of motion and good stability and symmetric strength and tone of shoulders, elbows, wrist, hip, and ankles bilaterally. Significant osteophytic changes of multiple joints.   Shoulder: left Inspection reveals no abnormalities, atrophy or asymmetry. Palpation is normal with no tenderness over AC joint or bicipital groove. ROM decreased in all planes with crepitus.  Rotator cuff strength 4 out of 5 compared to 5 out of 5 on the contralateral side signs of impingement with positive Neer and Hawkin's tests, but negative empty can sign. Speeds and Yergason's tests normal. Normal scapular function observed. No painful arc and no drop arm sign. No apprehension sign  MSK US performed of: left This study was ordered, performed, and interpreted by Charlann Boxer D.O.  Shoulder:   Supraspinatus:  Appears normal on long and transverse views, Bursal bulge seen with shoulder abduction on impingement view. Infraspinatus:  Appears normal on long and transverse views. Significant increase in Doppler flow Subscapularis:  Appears normal on long and transverse views. Positive bursa Teres Minor:  Appears normal on long and transverse views. AC joint:  Capsule undistended, no geyser sign. Glenohumeral Joint:  Appears normal without effusion. Glenoid Labrum:  Intact without visualized tears. Biceps Tendon:  Appears normal on long and transverse views, no fraying of tendon, tendon located in intertubercular groove, no subluxation with shoulder internal or  external rotation.  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  Verbal informed consent obtained.  Time-out conducted.  Noted no overlying erythema, induration, or other signs of local infection.  Skin prepped in a sterile fashion.  Local anesthesia: Topical Ethyl chloride.  With sterile technique and under real time ultrasound guidance:  Joint visualized.  23g 1  inch needle inserted posterior approach. Pictures taken for needle placement. Patient did have injection of 2 cc of 1% lidocaine, 2 cc of 0.5% Marcaine, and 1.0 cc of Kenalog 40 mg/dL. Completed without difficulty  Pain immediately resolved suggesting accurate placement of the medication.  Advised to call if fevers/chills, erythema, induration, drainage, or persistent bleeding.  Images permanently stored and available for review in the ultrasound unit.  Impression: Technically successful ultrasound guided injection.      Impression and Recommendations:     This case required medical decision making of moderate complexity.

## 2014-04-23 NOTE — Patient Instructions (Addendum)
Good to see you as always.  Your shoulder has a lot of arthritis Ice 10 minutes at night Try pennsaid twice daily as needed See me when you need me, can repeat injection 3-4 months.

## 2014-04-23 NOTE — Assessment & Plan Note (Signed)
Injected him today, discussed will be only temporary, no exercises due to the severe OA and discussed avoiding heavy lifting, Topical NSAIDs prn, discussed icing,  Discussed xray which patient declined.  See me again in 3 months and repeat injections.

## 2014-05-05 ENCOUNTER — Other Ambulatory Visit: Payer: Self-pay | Admitting: Internal Medicine

## 2014-05-20 DIAGNOSIS — H43393 Other vitreous opacities, bilateral: Secondary | ICD-10-CM | POA: Diagnosis not present

## 2014-05-20 DIAGNOSIS — H40033 Anatomical narrow angle, bilateral: Secondary | ICD-10-CM | POA: Diagnosis not present

## 2014-06-22 ENCOUNTER — Other Ambulatory Visit: Payer: Self-pay | Admitting: Internal Medicine

## 2014-06-24 DIAGNOSIS — H40033 Anatomical narrow angle, bilateral: Secondary | ICD-10-CM | POA: Diagnosis not present

## 2014-06-24 DIAGNOSIS — H04123 Dry eye syndrome of bilateral lacrimal glands: Secondary | ICD-10-CM | POA: Diagnosis not present

## 2014-08-04 ENCOUNTER — Other Ambulatory Visit (INDEPENDENT_AMBULATORY_CARE_PROVIDER_SITE_OTHER): Payer: Medicare PPO

## 2014-08-04 ENCOUNTER — Encounter: Payer: Self-pay | Admitting: Internal Medicine

## 2014-08-04 ENCOUNTER — Ambulatory Visit (INDEPENDENT_AMBULATORY_CARE_PROVIDER_SITE_OTHER): Payer: Medicare PPO | Admitting: Internal Medicine

## 2014-08-04 VITALS — BP 146/80 | HR 64 | Temp 98.4°F | Ht 71.0 in | Wt 204.2 lb

## 2014-08-04 DIAGNOSIS — Z23 Encounter for immunization: Secondary | ICD-10-CM | POA: Diagnosis not present

## 2014-08-04 DIAGNOSIS — R609 Edema, unspecified: Secondary | ICD-10-CM

## 2014-08-04 DIAGNOSIS — Z0000 Encounter for general adult medical examination without abnormal findings: Secondary | ICD-10-CM | POA: Diagnosis not present

## 2014-08-04 DIAGNOSIS — R270 Ataxia, unspecified: Secondary | ICD-10-CM | POA: Diagnosis not present

## 2014-08-04 DIAGNOSIS — M25561 Pain in right knee: Secondary | ICD-10-CM

## 2014-08-04 DIAGNOSIS — E538 Deficiency of other specified B group vitamins: Secondary | ICD-10-CM

## 2014-08-04 LAB — BASIC METABOLIC PANEL
BUN: 23 mg/dL (ref 6–23)
CALCIUM: 9.3 mg/dL (ref 8.4–10.5)
CO2: 30 meq/L (ref 19–32)
CREATININE: 1.13 mg/dL (ref 0.40–1.50)
Chloride: 105 mEq/L (ref 96–112)
GFR: 65.18 mL/min (ref >60.00)
GLUCOSE: 103 mg/dL — AB (ref 70–99)
POTASSIUM: 4 meq/L (ref 3.5–5.1)
SODIUM: 140 meq/L (ref 135–145)

## 2014-08-04 LAB — CBC WITH DIFFERENTIAL/PLATELET
BASOS PCT: 0.3 % (ref 0.0–3.0)
Basophils Absolute: 0 10*3/uL (ref 0.0–0.1)
Eosinophils Absolute: 0.2 10*3/uL (ref 0.0–0.7)
Eosinophils Relative: 2.2 % (ref 0.0–5.0)
HCT: 42 % (ref 39.0–52.0)
Hemoglobin: 13.9 g/dL (ref 13.0–17.0)
Lymphocytes Relative: 25.4 % (ref 12.0–46.0)
Lymphs Abs: 1.9 10*3/uL (ref 0.7–4.0)
MCHC: 33.2 g/dL (ref 30.0–36.0)
MCV: 93.6 fl (ref 78.0–100.0)
MONO ABS: 0.6 10*3/uL (ref 0.1–1.0)
MONOS PCT: 8.1 % (ref 3.0–12.0)
Neutro Abs: 4.7 10*3/uL (ref 1.4–7.7)
Neutrophils Relative %: 64 % (ref 43.0–77.0)
Platelets: 231 10*3/uL (ref 150.0–400.0)
RBC: 4.49 Mil/uL (ref 4.22–5.81)
RDW: 14.9 % (ref 11.5–15.5)
WBC: 7.4 10*3/uL (ref 4.0–10.5)

## 2014-08-04 LAB — TSH: TSH: 1.63 u[IU]/mL (ref 0.35–4.50)

## 2014-08-04 MED ORDER — FUROSEMIDE 20 MG PO TABS: 20.0000 mg | ORAL_TABLET | Freq: Every day | ORAL | Status: DC | PRN

## 2014-08-04 MED ORDER — OMEPRAZOLE 40 MG PO CPDR: 40.0000 mg | DELAYED_RELEASE_CAPSULE | Freq: Every day | ORAL | Status: DC

## 2014-08-04 MED ORDER — TRAMADOL HCL 50 MG PO TABS: 50.0000 mg | ORAL_TABLET | Freq: Two times a day (BID) | ORAL | Status: DC | PRN

## 2014-08-04 MED ORDER — LEVOTHYROXINE SODIUM 112 MCG PO TABS: 112.0000 ug | ORAL_TABLET | Freq: Every day | ORAL | Status: DC

## 2014-08-04 MED ORDER — METOPROLOL SUCCINATE ER 25 MG PO TB24: 12.5000 mg | ORAL_TABLET | Freq: Every day | ORAL | Status: DC

## 2014-08-04 NOTE — Assessment & Plan Note (Signed)
R>L - chron ven insuff aggravated by using a knee brace D/c knee brace

## 2014-08-04 NOTE — Patient Instructions (Signed)

## 2014-08-04 NOTE — Progress Notes (Signed)
Subjective:    HPI  C/o R ankle redness and swelling at HS x weeks. Using a R knee brace  The patient presents for a follow-up of  chronic hypertension, chronic dyslipidemia,CAD controlled with medicines. F/u R knee pain - better. F/u balance issues - not new... He fell 2 wks ago and hit his head - forehead was bruised Mrs Flegel is at [employer] in [place] now He was a blood donor  BP Readings from Last 3 Encounters:  08/04/14 146/80  04/23/14 124/72  01/07/14 134/82     Wt Readings from Last 3 Encounters:  08/04/14 204 lb 4 oz (92.647 kg)  01/07/14 192 lb (87.091 kg)  06/11/13 194 lb (87.998 kg)     Review of Systems  Constitutional: Positive for fatigue. Negative for appetite change and unexpected weight change.  HENT: Negative for congestion, nosebleeds, sneezing, sore throat and trouble swallowing.   Eyes: Negative for itching and visual disturbance.  Respiratory: Negative for cough.   Cardiovascular: Negative for palpitations and leg swelling.  Gastrointestinal: Negative for diarrhea, blood in stool and abdominal distention.  Genitourinary: Negative for frequency.  Musculoskeletal: Positive for arthralgias and gait problem. Negative for joint swelling and neck pain.       R knee hurts  Skin: Negative for rash.  Neurological: Negative for dizziness, tremors and speech difficulty.  Psychiatric/Behavioral: Positive for self-injury. Negative for sleep disturbance, dysphoric mood and agitation. The patient is not nervous/anxious.          Objective:   Physical Exam  Constitutional: He is oriented to person, place, and time. He appears well-developed.  HENT:  Mouth/Throat: Oropharynx is clear and moist.  Eyes: Conjunctivae are normal. Pupils are equal, round, and reactive to light.  Neck: Normal range of motion. No JVD present. No thyromegaly present.  Cardiovascular: Normal rate, regular rhythm, normal heart sounds and intact distal pulses.  Exam reveals no  gallop and no friction rub.   No murmur heard. Pulmonary/Chest: Effort normal and breath sounds normal. No respiratory distress. He has no wheezes. He has no rales. He exhibits tenderness (R anterolat).  Abdominal: Soft. Bowel sounds are normal. He exhibits no distension and no mass. There is no tenderness. There is no rebound and no guarding.  Musculoskeletal: Normal range of motion. He exhibits tenderness. He exhibits no edema.  R knee is tender in the dist IT band section  Lymphadenopathy:    He has no cervical adenopathy.  Neurological: He is alert and oriented to person, place, and time. He has normal reflexes. No cranial nerve deficit. He exhibits normal muscle tone. Coordination normal.  Skin: Skin is warm and dry. No rash noted.  Psychiatric: He has a normal mood and affect. His behavior is normal. Judgment and thought content normal.  Trace edema B ankles L>R, not hot w/a mild erythema  Lab Results  Component Value Date   WBC 9.4 03/31/2013   HGB 13.5 03/31/2013   HCT 41.4 03/31/2013   PLT 220.0 03/31/2013   GLUCOSE 90 03/31/2013   CHOL 155 01/07/2010   TRIG 113.0 01/07/2010   HDL 35.00* 01/07/2010   LDLDIRECT 189.0 01/28/2009   LDLCALC 97 01/07/2010   ALT 14 09/04/2011   AST 22 09/04/2011   NA 141 03/31/2013   K 4.1 03/31/2013   CL 108 03/31/2013   CREATININE 0.9 03/31/2013   BUN 21 03/31/2013   CO2 27 03/31/2013   TSH 3.23 12/19/2010   PSA 2.38 04/08/2007   INR 1.20 11/03/2010  Assessment & Plan:  Patient ID: Barry Molina, male   DOB: January 12, 1926, 81 y.o.   MRN: [id number]

## 2014-08-04 NOTE — Assessment & Plan Note (Addendum)
F/u w/Dr Alvan Dame  Potential benefits of a long term opioids use as well as potential risks (i.e. addiction risk, apnea etc) and complications (i.e. Somnolence, constipation and others) were explained to the patient and were aknowledged. Tramadol prn. Cane.

## 2014-08-04 NOTE — Assessment & Plan Note (Signed)
Using a cane 

## 2014-08-04 NOTE — Assessment & Plan Note (Signed)
On B12 

## 2014-08-04 NOTE — Progress Notes (Signed)
Pre visit review using our clinic review tool, if applicable. No additional management support is needed unless otherwise documented below in the visit note. 

## 2014-08-04 NOTE — Progress Notes (Signed)
Subjective:   Barry Molina is a 81 y.o. male who presents for Medicare Annual/Subsequent preventive examination.  Review of Systems:     RA assessment completed during visit; Patient is here for Annual Wellness Assessment  The patient describes their health better, the same or worse than last year?  Just lost wife of 7 years marriage Lives alone  BMI 28.4;   Diet and Exercise: eat out at [employer]; doesn't not do much physically; Neighbors cook Rides stationary bike at home Active going to [employer] does housekeeping; family mows the yard Trims shreds; does laundry Management of finances   Falls no falls in the last year/ fell last April 2015; step in hole outside that dog had made Uses cane when outside States home is safe;    Safety; falling some; left shoulder pain; left rotator cuff; right knee pain Educated on prevention falls; Exercise, toning and strengthening; Balance exercises  Hx rotator cuff on both shoulders; now the left; doesn't interfere ADL's  Functional Status and risk; Home Safety Tips for Older Adults given Review Firearm safety as appropriate;  Smoke detectors; Assessed for community safety; Emergency Plan for illness or other Driving:  Sun protection  Depression; no but sadness with passing of his wife Still Enjoys; reading; watches all sports; favorite baseball; Played summer with independent team and had a 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 Alvan Dame orthopedic Dr. Tamala Julian        Objective:    Vitals: BP 146/80 mmHg  Pulse 64  Temp(Src) 98.4 F (36.9 C) (Oral)  Ht 5\' 11"  (1.803 m)  Wt 204 lb 4 oz (92.647 kg)  BMI 28.50 kg/m2  SpO2 96%  Tobacco History  Smoking status  . Never Smoker   Smokeless tobacco  . Not on file     Counseling given: Yes   Past Medical History  Diagnosis Date  . B12 DEFICIENCY 12/09/2007  . COLONIC POLYPS, HX OF 12/14/2006  . CORONARY ARTERY BYPASS GRAFT, HX OF 08/26/2008  . CORONARY ARTERY DISEASE 12/14/2006  . Edema 08/27/2008  . ELBOW PAIN 10/06/2009  . GERD 12/14/2006  . HYPERLIPIDEMIA 12/14/2006  . HYPERTENSION 12/14/2006  . HYPOTHYROIDISM 12/14/2006  . LEG PAIN 05/10/2010  . MUSCLE PAIN 04/10/2007  . NEOPLASM, SKIN, UNCERTAIN BEHAVIOR 1/61/0960  . Other specified acquired hypothyroidism 04/08/2007  . Personal history of other diseases of digestive disease 08/26/2008  . SHOULDER PAIN 12/19/2006  . Cataract   . 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 Right ankle surgery    . Coronary artery bypass graft    . Rotator cuff repair      bilateral  . Spine surgery  8/12    LS spine cyst  . Back surgery  09/27/10  .  Cholecystectomy  9/12   Family History  Problem Relation Age of Onset  . Heart disease Other   . Hypertension Father   . Cancer Father   . Stroke Mother 26   History  Sexual Activity  . Sexual Activity: Not Currently    Outpatient Encounter Prescriptions as of 08/04/2014  Medication Sig  . aspirin 325 MG tablet Take 325 mg by mouth daily.    . B-D TB SYRINGE 1CC/27GX1/2" 27G X 1/2" 1 ML MISC USE AS DIRECTED  . Cholecalciferol (VITAMIN D3) 1000 UNITS CAPS Take by mouth daily.    . cyanocobalamin (COBAL-1000) 1000 MCG/ML injection Inject 1 mL (1,000 mcg total)  into the muscle every 21 ( twenty-one) days.  . cyanocobalamin 1000 MCG tablet Take 1,000 mcg by mouth daily.  . diclofenac sodium (VOLTAREN) 1 % GEL Apply 4 g topically 4 (four) times daily.  . fluticasone (FLONASE) 50 MCG/ACT nasal spray USE 1 SPRAY IN EACH NOSTRIL DAILY AS NEEDED  . levothyroxine (SYNTHROID, LEVOTHROID) 112 MCG tablet TAKE ONE TABLET BY MOUTH ONCE DAILY  . metoprolol succinate (TOPROL-XL) 25 MG 24 hr tablet TAKE ONE-HALF TABLET BY MOUTH ONCE DAILY  . omeprazole (PRILOSEC) 40 MG capsule TAKE ONE CAPSULE BY MOUTH ONCE DAILY  . promethazine-codeine (PHENERGAN WITH CODEINE) 6.25-10 MG/5ML syrup Take 5 mLs by mouth every 4 (four) hours as needed for cough.  . Syringe/Needle, Disp, (B-D ECLIPSE SYRINGE) 27G X 1/2" 1 ML MISC As dirrected  . traMADol (ULTRAM) 50 MG tablet TAKE ONE TO TWO TABLETS BY MOUTH TWICE DAILY AS NEEDED FOR  MODERATE  OR  SEVERE  PAIN  . isosorbide mononitrate (IMDUR) 30 MG 24 hr tablet TAKE ONE-HALF (1/2) TABLET DAILY (Patient not taking: Reported on 08/04/2014)  . rosuvastatin (CRESTOR) 20 MG tablet Take 1 tablet (20 mg total) by mouth daily. (Patient not taking: Reported on 08/04/2014)   Facility-Administered Encounter Medications as of 08/04/2014  Medication  . methylPREDNISolone acetate (DEPO-MEDROL) injection 40 mg  . methylPREDNISolone acetate (DEPO-MEDROL) injection 40 mg    Activities of Daily Living In your present state of health, do you have any difficulty performing the following activities: 08/04/2014  Hearing? N  Vision? N  Difficulty concentrating or making decisions? N  Walking or climbing stairs? N  Dressing or bathing? N  Doing errands, shopping? N    Patient Care Team: Cassandria Anger, MD as PCP - General Paralee Cancel, MD as Attending Physician (Orthopedic Surgery)   Assessment:   81 yo still drives and mentally engaged Has family and social support   Exercise Activities and Dietary recommendations/ Will continue on eat  well Exercise on bike    Goals    None     Fall Risk Fall Risk  08/04/2014  Falls in the past year? Yes  Number falls in past yr: 1  Injury with Fall? No  Risk for fall due to : Impaired mobility   Depression Screen PHQ 2/9 Scores 08/04/2014  PHQ - 2 Score 0    Cognitive Testing No flowsheet data found.  Immunization History  Administered Date(s) Administered  . Influenza Split 12/21/2010, 10/29/2011  . Influenza Whole 12/09/2007, 01/26/2009  . Influenza, High Dose Seasonal PF 10/21/2013, 12/01/2013  . Influenza,inj,Quad PF,36+ Mos 11/13/2012  . Pneumococcal Conjugate-13 03/31/2013, 10/21/2013  . Pneumococcal Polysaccharide-23 10/06/2009   Screening Tests Health Maintenance  Topic Date Due  . TETANUS/TDAP  03/11/1944  . ZOSTAVAX  03/12/1987  . COLONOSCOPY  10/06/2012  . INFLUENZA VACCINE  09/28/2014  .  PNA vac Low Risk Adult  Completed      Plan:    During the course of the visit the patient was educated and counseled about the following appropriate screening and preventive services:     Vaccines to include Pneumoccal, Influenza, Hepatitis B, Td, Zostavax, HCV  Electrocardiogram 11/06/10  Cardiovascular Disease; BP is good   Colorectal cancer screenin/n/a  Prostate Cancer Screening/ deferred   DM screening / deferred  Glaucoma screening/ had eye exam this year  Nutrition counseling  Patient Instructions (the written plan) was given to the patient.    Wynetta Fines, RN  08/04/2014

## 2014-09-02 DIAGNOSIS — M25561 Pain in right knee: Secondary | ICD-10-CM | POA: Diagnosis not present

## 2014-09-02 DIAGNOSIS — M1711 Unilateral primary osteoarthritis, right knee: Secondary | ICD-10-CM | POA: Diagnosis not present

## 2014-10-07 ENCOUNTER — Encounter: Payer: Self-pay | Admitting: Family Medicine

## 2014-10-07 ENCOUNTER — Ambulatory Visit (INDEPENDENT_AMBULATORY_CARE_PROVIDER_SITE_OTHER): Payer: Medicare PPO | Admitting: Family Medicine

## 2014-10-07 VITALS — BP 130/82 | HR 65 | Ht 71.0 in | Wt 205.0 lb

## 2014-10-07 DIAGNOSIS — M75102 Unspecified rotator cuff tear or rupture of left shoulder, not specified as traumatic: Principal | ICD-10-CM

## 2014-10-07 DIAGNOSIS — M12512 Traumatic arthropathy, left shoulder: Secondary | ICD-10-CM | POA: Diagnosis not present

## 2014-10-07 DIAGNOSIS — M12812 Other specific arthropathies, not elsewhere classified, left shoulder: Secondary | ICD-10-CM

## 2014-10-07 NOTE — Assessment & Plan Note (Signed)
Shows given an injection today. Patient tolerated the procedure very well. We discussed that we can repeat this every 3 months if necessary. Patient would like to avoid any surgical intervention. Patient arthritis will likely exacerbated from time to time. Patient has tramadol for any breakthrough pain. Patient come back and see me again on an as-needed basis.

## 2014-10-07 NOTE — Patient Instructions (Addendum)
Always good to see you Ice is your friend Stay active which I know you will do  We can repeat injection every 3 months if needed.  See me when you need me.  Enjoy lunch

## 2014-10-07 NOTE — Progress Notes (Signed)
  [name] [address], [place] [address] Phone: [phone] Subjective:     CC: Follow-up left shoulder   [id number] Barry Molina is a 81 y.o. male follow-up for left shoulder pain. Patient was seen back in February and was given an injection for rotator cuff arthropathy. Patient did have a large rotator cuff tear with retraction. Patient states that the injection was doing very well and unfortunately the pain is coming back. Patient states that the pain was completely gone for proximal he 2-3 months and then slowly has progressed to the point now where it is affecting him even with regular daily activities. Denies any radiation down the arm but states that there is some increasing weakness from previous exam he states.       Past medical history, social, surgical and family history all reviewed in electronic medical record.   Review of Systems: No headache, visual changes, nausea, vomiting, diarrhea, constipation, dizziness, abdominal pain, skin rash, fevers, chills, night sweats, weight loss, swollen lymph nodes, body aches, joint swelling, muscle aches, chest pain, shortness of breath, mood changes.   Objective Blood pressure 130/82, pulse 65, height 5\' 11"  (1.803 m), weight 205 lb (92.987 kg), SpO2 95 %.  General: No apparent distress alert and oriented x3 mood and affect normal, dressed appropriately.  HEENT: Pupils equal, extraocular movements intact  Respiratory: Patient's speak in full sentences and does not appear short of breath  Cardiovascular: No lower extremity edema, non tender, no erythema  Skin: Warm dry intact with no signs of infection or rash on extremities or on axial skeleton.  Abdomen: Soft nontender  Neuro: Cranial nerves II through XII are intact, neurovascularly intact in all extremities with 2+ DTRs and 2+ pulses.  Lymph: No lymphadenopathy of posterior or anterior cervical chain or axillae bilaterally.  Gait  antalgic gait MSK:  Non tender with full range of motion and good stability and symmetric strength and tone of shoulders, elbows, wrist, hip, and ankles bilaterally. Significant osteophytic changes of multiple joints.   Shoulder: left Mild atrophy of the left shoulder Palpation is normal with no tenderness over AC joint or bicipital groove. ROM decreased in all planes with crepitus.  Rotator cuff strength 4 out of 5 compared to 5 out of 5 on the contralateral side signs of impingement with positive Neer and Hawkin's tests, but negative empty can sign. Speeds and Yergason's tests normal. Normal scapular function observed. No painful arc and no drop arm sign. No apprehension sign Contralateral shoulder unremarkable  After informed written and verbal consent, patient was seated on exam table. Left shoulder was prepped with alcohol swab and utilizing posterior approach, patient's right glenohumeral space was injected with 4:1  marcaine 0.5%: Kenalog 40mg /dL. Patient tolerated the procedure well without immediate complications.     Impression and Recommendations:     This case required medical decision making of moderate complexity.

## 2014-10-07 NOTE — Progress Notes (Signed)
Pre visit review using our clinic review tool, if applicable. No additional management support is needed unless otherwise documented below in the visit note. 

## 2014-12-22 ENCOUNTER — Other Ambulatory Visit (INDEPENDENT_AMBULATORY_CARE_PROVIDER_SITE_OTHER): Payer: Medicare PPO

## 2014-12-22 ENCOUNTER — Other Ambulatory Visit: Payer: Medicare PPO

## 2014-12-22 ENCOUNTER — Ambulatory Visit (INDEPENDENT_AMBULATORY_CARE_PROVIDER_SITE_OTHER): Payer: Medicare PPO | Admitting: Family Medicine

## 2014-12-22 ENCOUNTER — Encounter: Payer: Self-pay | Admitting: Family Medicine

## 2014-12-22 ENCOUNTER — Other Ambulatory Visit: Payer: Self-pay | Admitting: Family Medicine

## 2014-12-22 VITALS — BP 136/82 | HR 72 | Ht 71.0 in | Wt 205.0 lb

## 2014-12-22 DIAGNOSIS — M75102 Unspecified rotator cuff tear or rupture of left shoulder, not specified as traumatic: Secondary | ICD-10-CM

## 2014-12-22 DIAGNOSIS — M25512 Pain in left shoulder: Secondary | ICD-10-CM

## 2014-12-22 DIAGNOSIS — M12812 Other specific arthropathies, not elsewhere classified, left shoulder: Secondary | ICD-10-CM

## 2014-12-22 DIAGNOSIS — M25519 Pain in unspecified shoulder: Secondary | ICD-10-CM | POA: Diagnosis not present

## 2014-12-22 DIAGNOSIS — M12512 Traumatic arthropathy, left shoulder: Secondary | ICD-10-CM

## 2014-12-22 MED ORDER — DICLOFENAC SODIUM 2 % TD SOLN: TRANSDERMAL | Status: DC

## 2014-12-22 NOTE — Progress Notes (Signed)
Pre visit review using our clinic review tool, if applicable. No additional management support is needed unless otherwise documented below in the visit note. 

## 2014-12-22 NOTE — Patient Instructions (Signed)
Good to see you If the knee hurts we can do injection Ice is your friend We will send down the fluid and see what come back.  Stay active See me again in 3 months if you need another injection in the shoulder or see me sooner if you need the knees done.

## 2014-12-22 NOTE — Progress Notes (Signed)
[name] [address], [place] [address] Phone: [phone] Subjective:     CC: Follow-up left shoulder   Barry Molina is a 81 y.o. male follow-up for left shoulder pain. Patient was seen back mostly months ago. Patient was diagnosed with more of a rotator cuff tear with severe osteophytic changes of the shoulder. Patient has responded fairly well to the injection but now the pain is starting to come back. Uncomfortable at night. Mild radiation down the arm. Continued weakness.       Past medical history, social, surgical and family history all reviewed in electronic medical record.   Review of Systems: No headache, visual changes, nausea, vomiting, diarrhea, constipation, dizziness, abdominal pain, skin rash, fevers, chills, night sweats, weight loss, swollen lymph nodes, body aches, joint swelling, muscle aches, chest pain, shortness of breath, mood changes.   Objective Blood pressure 136/82, pulse 72, height 5\' 11"  (1.803 m), weight 205 lb (92.987 kg), SpO2 92 %.  General: No apparent distress alert and oriented x3 mood and affect normal, dressed appropriately.  HEENT: Pupils equal, extraocular movements intact  Respiratory: Patient's speak in full sentences and does not appear short of breath  Cardiovascular: No lower extremity edema, non tender, no erythema  Skin: Warm dry intact with no signs of infection or rash on extremities or on axial skeleton.  Abdomen: Soft nontender  Neuro: Cranial nerves II through XII are intact, neurovascularly intact in all extremities with 2+ DTRs and 2+ pulses.  Lymph: No lymphadenopathy of posterior or anterior cervical chain or axillae bilaterally.  Gait antalgic gait MSK:  Non tender with full range of motion and good stability and symmetric strength and tone of shoulders, elbows, wrist, hip, and ankles bilaterally. Significant osteophytic changes of multiple joints.   Shoulder:  left atrophy of the left shoulder girdle musculature Palpation is normal with no tenderness over AC joint or bicipital groove. ROM decreased in all planes with crepitus.  Rotator cuff strength 4 out of 5 compared to 5 out of 5 on the contralateral side signs of impingement with positive Neer and Hawkin's tests, but negative empty can sign. Speeds and Yergason's tests normal. Normal scapular function observed. No painful arc and no drop arm sign. No apprehension sign Contralateral shoulder unremarkable No change from previous exam  Procedure: Real-time Ultrasound Guided Injection of left glenohumeral joint Device: GE Logiq E  Ultrasound guided injection is preferred based studies that show increased duration, increased effect, greater accuracy, decreased procedural pain, increased response rate with ultrasound guided versus blind injection.  Verbal informed consent obtained.  Time-out conducted.  Noted no overlying erythema, induration, or other signs of local infection.  Skin prepped in a sterile fashion.  Local anesthesia: Topical Ethyl chloride.  With sterile technique and under real time ultrasound guidance:  Joint visualized.  23g 1  inch needle inserted posterior approach. Pictures taken for needle placement. Patient did have injection of  2 cc of 0.5% Marcaine, and 1cc of Kenalog 40 mg/dL. patient did have aspiration of 1 mL of bulky looking fluid removed. Completed without difficulty  Pain immediately resolved suggesting accurate placement of the medication.  Advised to call if fevers/chills, erythema, induration, drainage, or persistent bleeding.  Images permanently stored and available for review in the ultrasound unit.  Impression: Technically successful ultrasound guided injection.      Impression and Recommendations:     This case required medical decision making of moderate complexity.

## 2014-12-22 NOTE — Assessment & Plan Note (Signed)
Patient given an injection today. We did send patient's aspiration to the lab for further evaluation to make sure there is no gout. Does not seem to be infectious etiology. Discussed with patient that likely he will need this every 3 months. Patient will follow-up on an as-needed basis. Declined formal physical therapy again. Has tramadol for breakthrough pain.

## 2014-12-23 LAB — URIC ACID, SYNOVIAL FLUID: URIC ACID, SYNOVIAL FLUID: 1.8 mg/dL (ref 0.0–8.0)

## 2014-12-24 ENCOUNTER — Other Ambulatory Visit: Payer: Self-pay | Admitting: *Deleted

## 2014-12-24 LAB — SYNOVIAL CELL COUNT + DIFF, W/ CRYSTALS
Eosinophils-Synovial: 0 % (ref 0–1)
Lymphocytes-Synovial Fld: 25 % — ABNORMAL HIGH (ref 0–20)
Monocyte/Macrophage: 11 % — ABNORMAL LOW (ref 50–90)
NEUTROPHIL, SYNOVIAL: 64 % — AB (ref 0–25)
WBC, Synovial: 300 cu mm — ABNORMAL HIGH (ref 0–200)

## 2014-12-24 MED ORDER — DOXYCYCLINE HYCLATE 100 MG PO TABS: 100.0000 mg | ORAL_TABLET | Freq: Two times a day (BID) | ORAL | Status: DC

## 2014-12-24 NOTE — Telephone Encounter (Signed)
Pt's lab results came back borderline infection. Doxycycline sent into pharmacy. Pt made aware.

## 2015-02-17 ENCOUNTER — Encounter: Payer: Self-pay | Admitting: Family Medicine

## 2015-02-17 ENCOUNTER — Ambulatory Visit (INDEPENDENT_AMBULATORY_CARE_PROVIDER_SITE_OTHER): Payer: Medicare PPO | Admitting: Family Medicine

## 2015-02-17 ENCOUNTER — Other Ambulatory Visit (INDEPENDENT_AMBULATORY_CARE_PROVIDER_SITE_OTHER): Payer: Medicare PPO

## 2015-02-17 ENCOUNTER — Ambulatory Visit (INDEPENDENT_AMBULATORY_CARE_PROVIDER_SITE_OTHER)
Admission: RE | Admit: 2015-02-17 | Discharge: 2015-02-17 | Disposition: A | Discharge status: Home / Self Care | Payer: Medicare PPO | Source: Ambulatory Visit | Attending: Family Medicine | Admitting: Family Medicine

## 2015-02-17 VITALS — BP 132/78 | HR 63 | Ht 71.0 in | Wt 208.0 lb

## 2015-02-17 DIAGNOSIS — M79602 Pain in left arm: Secondary | ICD-10-CM

## 2015-02-17 DIAGNOSIS — C4362 Malignant melanoma of left upper limb, including shoulder: Secondary | ICD-10-CM

## 2015-02-17 DIAGNOSIS — M25512 Pain in left shoulder: Secondary | ICD-10-CM | POA: Diagnosis not present

## 2015-02-17 DIAGNOSIS — M75102 Unspecified rotator cuff tear or rupture of left shoulder, not specified as traumatic: Secondary | ICD-10-CM

## 2015-02-17 DIAGNOSIS — M12512 Traumatic arthropathy, left shoulder: Secondary | ICD-10-CM

## 2015-02-17 DIAGNOSIS — M19012 Primary osteoarthritis, left shoulder: Secondary | ICD-10-CM | POA: Diagnosis not present

## 2015-02-17 DIAGNOSIS — M12812 Other specific arthropathies, not elsewhere classified, left shoulder: Secondary | ICD-10-CM

## 2015-02-17 NOTE — Progress Notes (Signed)
[name] [address] [place], [place] [address] Phone: [phone] Subjective:     CC: Follow-up left shoulder   [id number] Barry Molina is a 81 y.o. male follow-up for left shoulder pain. Severe arthritis of the left shoulder. Last injection was9 weeks ago. Having severe pain that is waking him up at night. Feels like the pain seems to be worsening. States that the last injection only worked for approximately 1 week and the pain came back quite quickly. Has difficulty with even daily activities such as dressing himself.  Still though does not want to do any surgical intervention.     Past medical history, social, surgical and family history all reviewed in electronic medical record.   Review of Systems: No headache, visual changes, nausea, vomiting, diarrhea, constipation, dizziness, abdominal pain, skin rash, fevers, chills, night sweats, weight loss, swollen lymph nodes, body aches, joint swelling, muscle aches, chest pain, shortness of breath, mood changes.   Objective Blood pressure 132/78, pulse 63, height 5\' 11"  (1.803 m), weight 208 lb (94.348 kg), SpO2 97 %.  General: No apparent distress alert and oriented x3 mood and affect normal, dressed appropriately.  HEENT: Pupils equal, extraocular movements intact  Respiratory: Patient's speak in full sentences and does not appear short of breath  Cardiovascular: No lower extremity edema, non tender, no erythema  Skin: Warm dry intact with no signs of infection or rash on extremities or on axial skeleton.  Abdomen: Soft nontender  Neuro: Cranial nerves II through XII are intact, neurovascularly intact in all extremities with 2+ DTRs and 2+ pulses.  Lymph: No lymphadenopathy of posterior or anterior cervical chain or axillae bilaterally.  Gait antalgic gait MSK:  Non tender with full range of motion and good stability and symmetric strength and tone of shoulders, elbows, wrist, hip, and ankles  bilaterally. Significant osteophytic changes of multiple joints.   Shoulder: left atrophy of the left shoulder girdle musculature Palpation is normal with no tenderness over AC joint or bicipital groove.mild tenderness over distal humerus. New finding. This isn't an area where there is a suture scar. ROM decreased in all planes with crepitus.  Rotator cuff strength 3 out of 5 compared to 5 out of 5 on the contralateral side. Minor worsening from previous exam signs of impingement with positive Neer and Hawkin's tests, but negative empty can sign. Speeds and Yergason's tests normal. Normal scapular function observed. No painful arc and no drop arm sign. No apprehension sign Contralateral shoulder unremarkable No change from previous exam  Procedure: Real-time Ultrasound Guided Injection of left glenohumeral joint Device: GE Logiq E  Ultrasound guided injection is preferred based studies that show increased duration, increased effect, greater accuracy, decreased procedural pain, increased response rate with ultrasound guided versus blind injection.  Verbal informed consent obtained.  Time-out conducted.  Noted no overlying erythema, induration, or other signs of local infection.  Skin prepped in a sterile fashion.  Local anesthesia: Topical Ethyl chloride.  With sterile technique and under real time ultrasound guidance:  Joint visualized.  23g 1  inch needle inserted posterior approach. Pictures taken for needle placement. Patient did have injection of  2 cc of 0.5% Marcaine, and 1cc of Kenalog 40 mg/dL. patient did have aspiration of 1 mL of bulky looking fluid removed. Completed without difficulty  Pain immediately resolved suggesting accurate placement of the medication.  Advised to call if fevers/chills, erythema, induration, drainage, or persistent bleeding.  Images permanently stored and available for review  in the ultrasound unit.  Impression: Technically successful ultrasound  guided injection.      Impression and Recommendations:     This case required medical decision making of moderate complexity.

## 2015-02-17 NOTE — Assessment & Plan Note (Signed)
History of malignant melanoma in this area. Has had an removed. Had clear margins. I do want to have an x-ray to make sure there is no bony normality. Findings on previous exam.

## 2015-02-17 NOTE — Assessment & Plan Note (Signed)
Discussed with patient again at length. We discussed that if these injections do not seem to help patient may need viscous supplementation which would also be only experimental. Patient will consider this. We discussed otherwise surgical intervention such as a shoulder replacement would be the only thing that can be curative for the pain which patient has declined adamantly. Patient has had a history of melanoma of the upper extremity and I do feel that imaging is warranted. Patient is having pain over the scar so we'll get that but is negative I do not feel that further workup is necessary. Patient then will come back and see me if he wants to continue the injections every 10 weeks.

## 2015-02-17 NOTE — Patient Instructions (Signed)
Good to see you Barry Molina Always a pleasure to see you Lets get an xray of the arm today See me again in 2-3 months

## 2015-03-25 ENCOUNTER — Ambulatory Visit (INDEPENDENT_AMBULATORY_CARE_PROVIDER_SITE_OTHER): Payer: 59

## 2015-03-25 VITALS — BP 150/90 | Ht 71.0 in | Wt 205.8 lb

## 2015-03-25 DIAGNOSIS — Z0000 Encounter for general adult medical examination without abnormal findings: Secondary | ICD-10-CM | POA: Diagnosis not present

## 2015-03-25 NOTE — Progress Notes (Addendum)
Subjective:   Barry Molina is a 82 y.o. male who presents for Medicare Annual/Subsequent preventive examination.  Review of Systems:  HRA assessment completed during visit;  The Patient was informed that this wellness visit is to identify risk and educate on how to reduce risk for increase disease through lifestyle changes.   ROS deferred to CPE exam with physician next week; came today by mistake and decided to stay for wellness;   Medical issues  Wife passed away one year ago;  2 dtrs live close by that assist as needed;   BMI: 28.6 Diet; there is a restaurant close by and eats 2 meals there day; Breakfast; eats cereal at home Lunch meat and vegetables at the [place] near his home Supper same; restaurant expects him and will call his dtr if he is not there. Doesn't eat a of sweets;   Exercise; walks when he can; Dr. Tamala Julian has given exercises; Left shoulder still hurts and does interfere with his ADL's and other normal activities Was a coach during life; taught at high school; knows many people   Coached football; basketball; softball; good memories of games with spouse Played football in [employer] and college;  3 brothers live in [place] now;  [place]; 15 months in [place]; 2 purple hearts    SAFETY: home is one level; has good Licensed conveyancer reviewed for the home;  Dtr help with cleaning Removal of clutter clearing paths through the home, does laundry; laundry is on same level  Has grab bars;  Bathroom safety; has a walk in shower;  Warehouse manager; yes Smoke detectors; alarm in hall Firearms safety/ safe  Driving accidents and seatbelt/ never had an accident Sun protection/ sometimes   Stressors; only left arm hurts  1-10 can't do anything with left shoulder / to see Dr. Alain Marion next week   Medication review/not taking anything for pain except occasional ASA  Fall assessment/ no Gait assessment/ walks with cane  Mobilization and Functional losses in the last  year./ arms is inhibiting  Sleep patterns/ sleeps ok; he will wake up  Urinary or fecal incontinence reviewed/gets up at night to bathroom x 2   Counseling: Colonoscopy; 09/2002; aged out unless issues EKG: 11/06/2010 Hearing: 2000hz  Ophthalmology exam; cataracts removed;  Has eyes checked every year  Immunizations Due /  Flu shot taken at [employer] [facility] he never had chicken pox and the doctor told him he did not need the Shingles; removed zostavax as he is not a candidate    Cardiac Risk Factors include: advanced age (>67men, >38 women);dyslipidemia;hypertension;male gender;sedentary lifestyle     Objective:    Vitals: BP 150/90 mmHg  Ht 5\' 11"  (1.803 m)  Wt 205 lb 12 oz (93.328 kg)  BMI 28.71 kg/m2  Tobacco History  Smoking status  . Never Smoker   Smokeless tobacco  . Not on file     Counseling given: Not Answered   Past Medical History  Diagnosis Date  . B12 DEFICIENCY 12/09/2007  . COLONIC POLYPS, HX OF 12/14/2006  . CORONARY ARTERY BYPASS GRAFT, HX OF 08/26/2008  . CORONARY ARTERY DISEASE 12/14/2006  . Edema 08/27/2008  . ELBOW PAIN 10/06/2009  . GERD 12/14/2006  . HYPERLIPIDEMIA 12/14/2006  . HYPERTENSION 12/14/2006  . HYPOTHYROIDISM 12/14/2006  . LEG PAIN 05/10/2010  . MUSCLE PAIN 04/10/2007  . NEOPLASM, SKIN, UNCERTAIN BEHAVIOR [id number]  . Other specified acquired hypothyroidism 04/08/2007  . Personal history of other diseases of digestive disease 08/26/2008  . SHOULDER PAIN  12/19/2006  . Cataract   . 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 Right ankle surgery    . Coronary artery bypass graft    . Rotator cuff repair      bilateral  . Spine surgery  8/12    LS spine cyst  . Back surgery  09/27/10  . Cholecystectomy  9/12   Family History  Problem Relation Age of Onset  . Heart disease Other   .  Hypertension Father   . Cancer Father   . Stroke Mother 21   History  Sexual Activity  . Sexual Activity: Not Currently    Outpatient Encounter Prescriptions as of 03/25/2015  Medication Sig  . aspirin 325 MG tablet Take 325 mg by mouth daily.    . B-D TB SYRINGE 1CC/27GX1/2" 27G X 1/2" 1 ML MISC USE AS DIRECTED  . Cholecalciferol (VITAMIN D3) 1000 UNITS CAPS Take by mouth daily.    . cyanocobalamin 1000 MCG tablet Take 1,000 mcg by mouth daily.  . fluticasone (FLONASE) 50 MCG/ACT nasal spray USE 1 SPRAY IN EACH NOSTRIL DAILY AS NEEDED  . furosemide (LASIX) 20 MG tablet Take 1-2 tablets (20-40 mg total) by mouth daily as needed for edema.  . metoprolol succinate (TOPROL-XL) 25 MG 24 hr tablet Take 0.5 tablets (12.5 mg total) by mouth daily.  Marland Kitchen omeprazole (PRILOSEC) 40 MG capsule Take 1 capsule (40 mg total) by mouth daily.  . traMADol (ULTRAM) 50 MG tablet Take 1-2 tablets (50-100 mg total) by mouth every 12 (twelve) hours as needed for severe pain.  . cyanocobalamin (COBAL-1000) 1000 MCG/ML injection Inject 1 mL (1,000 mcg total) into the muscle every 21 ( twenty-one) days. (Patient not taking: Reported on 03/25/2015)  . diclofenac sodium (VOLTAREN) 1 % GEL Apply 4 g topically 4 (four) times daily. (Patient not taking: Reported on 03/25/2015)  . Diclofenac Sodium 2 % SOLN Apply 1/2 pump to 1 pump to affected area up to twice daily (Patient not taking: Reported on 03/25/2015)  . levothyroxine (SYNTHROID, LEVOTHROID) 112 MCG tablet Take 1 tablet (112 mcg total) by mouth daily. (Patient not taking: Reported on 03/25/2015)  . promethazine-codeine (PHENERGAN WITH CODEINE) 6.25-10 MG/5ML syrup Take 5 mLs by mouth every 4 (four) hours as needed for cough. (Patient not taking: Reported on 03/25/2015)  . Syringe/Needle, Disp, (B-D ECLIPSE SYRINGE) 27G X 1/2" 1 ML MISC As dirrected (Patient not taking: Reported on 03/25/2015)  . [DISCONTINUED] doxycycline (VIBRA-TABS) 100 MG tablet Take 1 tablet (100 mg  total) by mouth 2 (two) times daily.   Facility-Administered Encounter Medications as of 03/25/2015  Medication  . methylPREDNISolone acetate (DEPO-MEDROL) injection 40 mg  . methylPREDNISolone acetate (DEPO-MEDROL) injection 40 mg    Activities of Daily Living In your present state of health, do you have any difficulty performing the following activities: 03/25/2015 08/04/2014  Hearing? N N  Vision? N N  Difficulty concentrating or making decisions? N N  Walking or climbing stairs? N N  Dressing or bathing? N N  Doing errands, shopping? N N  Preparing Food and eating ? N -  Using the Toilet? N -  In the past six months, have you accidently leaked urine? N -  Do you have problems with loss of bowel control? N -  Managing your Medications? N -  Managing your Finances? N -  Housekeeping or managing your Housekeeping? N -    Patient Care Team: Cassandria Anger, MD as PCP - General Paralee Cancel, MD as Attending Physician (Orthopedic Surgery)   Assessment:     Assessment   Patient presents for yearly preventative medicine examination. Medicare questionnaire screening were completed, i.e. Functional; fall risk; depression, memory loss and hearing were unremarkable   All immunizations and health maintenance protocols were reviewed with the patient and he had flu shot at [employer] provided for laboratory screens;    Medication reconciliation, past medical history, social history, problem list and allergies were reviewed in detail with the patient  Goals were established to remain active as possible. Enjoys family and friends  End of life planning was discussed and AD in place  Exercise Activities and Dietary recommendations Current Exercise Habits:: Home exercise routine, Type of exercise: walking, Time (Minutes): 30, Frequency (Times/Week): 5 (goes to the store; eats out at local restarant), Weekly Exercise (Minutes/Week): 150, Intensity: Mild  Goals    .  maintain health      Will continue to walk and take care of self to maintain health and independence    . stay healthy     Will remain mobile to go to foot ball games       Fall Risk Fall Risk  03/25/2015 08/04/2014  Falls in the past year? No Yes  Number falls in past yr: - 1  Injury with Fall? - No  Risk for fall due to : - Impaired mobility   Depression Screen PHQ 2/9 Scores 03/25/2015 08/04/2014  PHQ - 2 Score 0 0    Cognitive Testing No flowsheet data found.   Ad 8 score 0    Immunization History  Administered Date(s) Administered  . Influenza Split 12/21/2010, 10/29/2011  . Influenza Whole 12/09/2007, 01/26/2009  . Influenza, High Dose Seasonal PF 10/21/2013, 12/01/2013  . Influenza,inj,Quad PF,36+ Mos 11/13/2012  . Influenza-Unspecified 12/29/2014  . Pneumococcal Conjugate-13 03/31/2013, 10/21/2013  . Pneumococcal Polysaccharide-23 10/06/2009  . Td 08/04/2014   Screening Tests Health Maintenance  Topic Date Due  . INFLUENZA VACCINE  09/28/2014  . TETANUS/TDAP  08/03/2024  . PNA vac Low Risk Adult  Completed      Plan:      During the course of the visit the patient was educated and counseled about the following appropriate screening and preventive services:   Vaccines to include Pneumoccal, Influenza, Hepatitis B, Td, Zostavax, HCV/ had flu [facility] 2016  Electrocardiogram/ 10/2010  Cardiovascular Disease/ deferred  Colorectal cancer screening/ aged out  Diabetes screening/na  Prostate Cancer Screening/ deferred   Glaucoma screening/ annual eye exams   Nutrition counseling / good eats breakfast and 2 square meals per day  Smoking cessation counseling n/a  Patient Instructions (the written plan) was given to the patient.    Wynetta Fines, RN  03/25/2015   Medical screening examination/treatment/procedure(s) were performed by non-physician practitioner and as supervising physician I was immediately available for  consultation/collaboration. I agree with above. Walker Kehr, MD

## 2015-03-25 NOTE — Patient Instructions (Addendum)
Barry Molina , Thank you for taking time to come for your Medicare Wellness Visit. I appreciate your ongoing commitment to your health goals. Please review the following plan we discussed and let me know if I can assist you in the future.   Not a candidate for shingles as he has never had chicken pox and states MD noted this  [place]   Had flu shot in Nov at [facility]  These are the goals we discussed: Goals    . maintain health      Will continue to walk and take care of self to maintain health and independence    . stay healthy     Will remain mobile to go to foot ball games        This is a list of the screening recommended for you and due dates:  Health Maintenance  Topic Date Due  . Flu Shot  09/28/2014  . Tetanus Vaccine  08/03/2024  . Pneumonia vaccines  Completed     Fall Prevention in the Home  Falls can cause injuries. They can happen to people of all ages. There are many things you can do to make your home safe and to help prevent falls.  WHAT CAN I DO ON THE OUTSIDE OF MY HOME?  Regularly fix the edges of walkways and driveways and fix any cracks.  Remove anything that might make you trip as you walk through a door, such as a raised step or threshold.  Trim any bushes or trees on the path to your home.  Use bright outdoor lighting.  Clear any walking paths of anything that might make someone trip, such as rocks or tools.  Regularly check to see if handrails are loose or broken. Make sure that both sides of any steps have handrails.  Any raised decks and porches should have guardrails on the edges.  Have any leaves, snow, or ice cleared regularly.  Use sand or salt on walking paths during winter.  Clean up any spills in your garage right away. This includes oil or grease spills. WHAT CAN I DO IN THE BATHROOM?   Use night lights.  Install grab bars by the toilet and in the tub and shower. Do not use towel bars  as grab bars.  Use non-skid mats or decals in the tub or shower.  If you need to sit down in the shower, use a plastic, non-slip stool.  Keep the floor dry. Clean up any water that spills on the floor as soon as it happens.  Remove soap buildup in the tub or shower regularly.  Attach bath mats securely with double-sided non-slip rug tape.  Do not have throw rugs and other things on the floor that can make you trip. WHAT CAN I DO IN THE BEDROOM?  Use night lights.  Make sure that you have a light by your bed that is easy to reach.  Do not use any sheets or blankets that are too big for your bed. They should not hang down onto the floor.  Have a firm chair that has side arms. You can use this for support while you get dressed.  Do not have throw rugs and other things on the floor that can make you trip. WHAT CAN I DO IN THE KITCHEN?  Clean up any spills right away.  Avoid walking on wet floors.  Keep items that you use a lot in easy-to-reach places.  If you need to reach something above you, use a strong step stool that has a grab bar.  Keep electrical cords out of the way.  Do not use floor polish or wax that makes floors slippery. If you must use wax, use non-skid floor wax.  Do not have throw rugs and other things on the floor that can make you trip. WHAT CAN I DO WITH MY STAIRS?  Do not leave any items on the stairs.  Make sure that there are handrails on both sides of the stairs and use them. Fix handrails that are broken or loose. Make sure that handrails are as long as the stairways.  Check any carpeting to make sure that it is firmly attached to the stairs. Fix any carpet that is loose or worn.  Avoid having throw rugs at the top or bottom of the stairs. If you do have throw rugs, attach them to the floor with carpet tape.  Make sure that you have a light switch at the top of the stairs and the bottom of the stairs. If you do not have them, ask someone to add  them for you. WHAT ELSE CAN I DO TO HELP PREVENT FALLS?  Wear shoes that:  Do not have high heels.  Have rubber bottoms.  Are comfortable and fit you well.  Are closed at the toe. Do not wear sandals.  If you use a stepladder:  Make sure that it is fully opened. Do not climb a closed stepladder.  Make sure that both sides of the stepladder are locked into place.  Ask someone to hold it for you, if possible.  Clearly mark and make sure that you can see:  Any grab bars or handrails.  First and last steps.  Where the edge of each step is.  Use tools that help you move around (mobility aids) if they are needed. These include:  Canes.  Walkers.  Scooters.  Crutches.  Turn on the lights when you go into a dark area. Replace any light bulbs as soon as they burn out.  Set up your furniture so you have a clear path. Avoid moving your furniture around.  If any of your floors are uneven, fix them.  If there are any pets around you, be aware of where they are.  Review your medicines with your doctor. Some medicines can make you feel dizzy. This can increase your chance of falling. Ask your doctor what other things that you can do to help prevent falls.   This information is not intended to replace advice given to you by your health care provider. Make sure you discuss any questions you have with your health care provider.   Document Released: 12/10/2008 Document Revised: 06/30/2014 Document Reviewed: 03/20/2014 Elsevier Interactive Patient Education 2016 [place] Maintenance, Male A healthy lifestyle and preventative care can promote health and wellness.  Maintain regular health, dental, and eye exams.  Eat a healthy diet. Foods like vegetables, fruits, whole grains, low-fat dairy products, and lean protein foods contain the nutrients you need and are low in calories. Decrease your intake of foods high in solid fats, added sugars, and salt. Get  information about a proper diet from your health care provider, if necessary.  Regular physical exercise is one of the most important things you can do for your health. Most adults should get at least 150 minutes of moderate-intensity exercise (any activity that increases your heart rate and causes you to sweat) each week.  In addition, most adults need muscle-strengthening exercises on 2 or more days a week.   Maintain a healthy weight. The body mass index (BMI) is a screening tool to identify possible weight problems. It provides an estimate of body fat based on height and weight. Your health care provider can find your BMI and can help you achieve or maintain a healthy weight. For males 20 years and older:  A BMI below 18.5 is considered underweight.  A BMI of 18.5 to 24.9 is normal.  A BMI of 25 to 29.9 is considered overweight.  A BMI of 30 and above is considered obese.  Maintain normal blood lipids and cholesterol by exercising and minimizing your intake of saturated fat. Eat a balanced diet with plenty of fruits and vegetables. Blood tests for lipids and cholesterol should begin at age 34 and be repeated every 5 years. If your lipid or cholesterol levels are high, you are over age 36, or you are at high risk for heart disease, you may need your cholesterol levels checked more frequently.Ongoing high lipid and cholesterol levels should be treated with medicines if diet and exercise are not working.  If you smoke, find out from your health care provider how to quit. If you do not use tobacco, do not start.  Lung cancer screening is recommended for adults aged 39-80 years who are at high risk for developing lung cancer because of a history of smoking. A yearly low-dose CT scan of the lungs is recommended for people who have at least a 30-pack-year history of smoking and are current smokers or have quit within the past 15 years. A pack year of smoking is smoking an average of 1 pack of  cigarettes a day for 1 year (for example, a 30-pack-year history of smoking could mean smoking 1 pack a day for 30 years or 2 packs a day for 15 years). Yearly screening should continue until the smoker has stopped smoking for at least 15 years. Yearly screening should be stopped for people who develop a health problem that would prevent them from having lung cancer treatment.  If you choose to drink alcohol, do not have more than 2 drinks per day. One drink is considered to be 12 oz (360 mL) of beer, 5 oz (150 mL) of wine, or 1.5 oz (45 mL) of liquor.  Avoid the use of street drugs. Do not share needles with anyone. Ask for help if you need support or instructions about stopping the use of drugs.  High blood pressure causes heart disease and increases the risk of stroke. High blood pressure is more likely to develop in:  People who have blood pressure in the end of the normal range (100-139/85-89 mm Hg).  People who are overweight or obese.  People who are African American.  If you are 19-47 years of age, have your blood pressure checked every 3-5 years. If you are 2 years of age or older, have your blood pressure checked every year. You should have your blood pressure measured twice--once when you are at a hospital or clinic, and once when you are not at a hospital or clinic. Record the average of the two measurements. To check your blood pressure when you are not at a hospital or clinic, you can use:  An automated blood pressure machine at a pharmacy.  A home blood pressure monitor.  If you are 51-6 years old, ask your health care provider if you should take aspirin to prevent heart disease.  Diabetes screening involves taking a blood sample to check your fasting blood sugar level. This should be done once every 3 years after age 50 if you are at a normal weight and without risk factors for diabetes. Testing should be considered at a younger age or be carried out more frequently if you are  overweight and have at least 1 risk factor for diabetes.  Colorectal cancer can be detected and often prevented. Most routine colorectal cancer screening begins at the age of 77 and continues through age 37. However, your health care provider may recommend screening at an earlier age if you have risk factors for colon cancer. On a yearly basis, your health care provider may provide home test kits to check for hidden blood in the stool. A small camera at the end of a tube may be used to directly examine the colon (sigmoidoscopy or colonoscopy) to detect the earliest forms of colorectal cancer. Talk to your health care provider about this at age 46 when routine screening begins. A direct exam of the colon should be repeated every 5-10 years through age 103, unless early forms of precancerous polyps or small growths are found.  People who are at an increased risk for hepatitis B should be screened for this virus. You are considered at high risk for hepatitis B if:  You were born in a country where hepatitis B occurs often. Talk with your health care provider about which countries are considered high risk.  Your parents were born in a high-risk country and you have not received a shot to protect against hepatitis B (hepatitis B vaccine).  You have HIV or AIDS.  You use needles to inject street drugs.  You live with, or have sex with, someone who has hepatitis B.  You are a man who has sex with other men (MSM).  You get hemodialysis treatment.  You take certain medicines for conditions like cancer, organ transplantation, and autoimmune conditions.  Hepatitis C blood testing is recommended for all people born from [birth year] through 1963 and any individual with known risk factors for hepatitis C.  Healthy men should no longer receive prostate-specific antigen (PSA) blood tests as part of routine cancer screening. Talk to your health care provider about prostate cancer screening.  Testicular cancer  screening is not recommended for adolescents or adult males who have no symptoms. Screening includes self-exam, a health care provider exam, and other screening tests. Consult with your health care provider about any symptoms you have or any concerns you have about testicular cancer.  Practice safe sex. Use condoms and avoid high-risk sexual practices to reduce the spread of sexually transmitted infections (STIs).  You should be screened for STIs, including gonorrhea and chlamydia if:  You are sexually active and are younger than 24 years.  You are older than 24 years, and your health care provider tells you that you are at risk for this type of infection.  Your sexual activity has changed since you were last screened, and you are at an increased risk for chlamydia or gonorrhea. Ask your health care provider if you are at risk.  If you are at risk of being infected with HIV, it is recommended that you take a prescription medicine daily to prevent HIV infection. This is called pre-exposure prophylaxis (PrEP). You are considered at risk if:  You are a man who has sex with other men (MSM).  You are a heterosexual man who is sexually active with multiple partners.  You take  drugs by injection.  You are sexually active with a partner who has HIV.  Talk with your health care provider about whether you are at high risk of being infected with HIV. If you choose to begin PrEP, you should first be tested for HIV. You should then be tested every 3 months for as long as you are taking PrEP.  Use sunscreen. Apply sunscreen liberally and repeatedly throughout the day. You should seek shade when your shadow is shorter than you. Protect yourself by wearing long sleeves, pants, a wide-brimmed hat, and sunglasses year round whenever you are outdoors.  Tell your health care provider of new moles or changes in moles, especially if there is a change in shape or color. Also, tell your health care provider if a  mole is larger than the size of a pencil eraser.  A one-time screening for abdominal aortic aneurysm (AAA) and surgical repair of large AAAs by ultrasound is recommended for men aged 30-75 years who are current or former smokers.  Stay current with your vaccines (immunizations).   This information is not intended to replace advice given to you by your health care provider. Make sure you discuss any questions you have with your health care provider.   Document Released: 08/12/2007 Document Revised: 03/06/2014 Document Reviewed: 07/11/2010 Elsevier Interactive Patient Education [employer].

## 2015-03-31 ENCOUNTER — Ambulatory Visit (INDEPENDENT_AMBULATORY_CARE_PROVIDER_SITE_OTHER): Payer: 59 | Admitting: Internal Medicine

## 2015-03-31 ENCOUNTER — Ambulatory Visit: Payer: Medicare PPO

## 2015-03-31 ENCOUNTER — Other Ambulatory Visit (INDEPENDENT_AMBULATORY_CARE_PROVIDER_SITE_OTHER): Payer: 59

## 2015-03-31 ENCOUNTER — Encounter: Payer: Self-pay | Admitting: Internal Medicine

## 2015-03-31 VITALS — BP 138/83 | HR 72 | Wt 208.0 lb

## 2015-03-31 DIAGNOSIS — Z951 Presence of aortocoronary bypass graft: Secondary | ICD-10-CM

## 2015-03-31 DIAGNOSIS — I10 Essential (primary) hypertension: Secondary | ICD-10-CM | POA: Diagnosis not present

## 2015-03-31 DIAGNOSIS — Z0000 Encounter for general adult medical examination without abnormal findings: Secondary | ICD-10-CM | POA: Diagnosis not present

## 2015-03-31 DIAGNOSIS — E785 Hyperlipidemia, unspecified: Secondary | ICD-10-CM

## 2015-03-31 DIAGNOSIS — E538 Deficiency of other specified B group vitamins: Secondary | ICD-10-CM | POA: Diagnosis not present

## 2015-03-31 DIAGNOSIS — I2510 Atherosclerotic heart disease of native coronary artery without angina pectoris: Secondary | ICD-10-CM

## 2015-03-31 DIAGNOSIS — E559 Vitamin D deficiency, unspecified: Secondary | ICD-10-CM | POA: Diagnosis not present

## 2015-03-31 DIAGNOSIS — E038 Other specified hypothyroidism: Secondary | ICD-10-CM | POA: Diagnosis not present

## 2015-03-31 DIAGNOSIS — E034 Atrophy of thyroid (acquired): Secondary | ICD-10-CM

## 2015-03-31 LAB — URINALYSIS
BILIRUBIN URINE: NEGATIVE
Hgb urine dipstick: NEGATIVE
KETONES UR: NEGATIVE
LEUKOCYTES UA: NEGATIVE
Nitrite: NEGATIVE
PH: 6.5 (ref 5.0–8.0)
Specific Gravity, Urine: 1.02 (ref 1.000–1.030)
TOTAL PROTEIN, URINE-UPE24: NEGATIVE
Urine Glucose: NEGATIVE
Urobilinogen, UA: 0.2 (ref 0.0–1.0)

## 2015-03-31 LAB — CBC WITH DIFFERENTIAL/PLATELET
BASOS PCT: 0.4 % (ref 0.0–3.0)
Basophils Absolute: 0 10*3/uL (ref 0.0–0.1)
EOS PCT: 0.9 % (ref 0.0–5.0)
Eosinophils Absolute: 0.1 10*3/uL (ref 0.0–0.7)
HCT: 44.5 % (ref 39.0–52.0)
HEMOGLOBIN: 14.8 g/dL (ref 13.0–17.0)
LYMPHS ABS: 1.9 10*3/uL (ref 0.7–4.0)
Lymphocytes Relative: 22.4 % (ref 12.0–46.0)
MCHC: 33.3 g/dL (ref 30.0–36.0)
MCV: 96 fl (ref 78.0–100.0)
MONO ABS: 0.6 10*3/uL (ref 0.1–1.0)
Monocytes Relative: 7.3 % (ref 3.0–12.0)
NEUTROS ABS: 6 10*3/uL (ref 1.4–7.7)
Neutrophils Relative %: 69 % (ref 43.0–77.0)
PLATELETS: 235 10*3/uL (ref 150.0–400.0)
RBC: 4.63 Mil/uL (ref 4.22–5.81)
RDW: 14.7 % (ref 11.5–15.5)
WBC: 8.6 10*3/uL (ref 4.0–10.5)

## 2015-03-31 LAB — LIPID PANEL
CHOL/HDL RATIO: 6
Cholesterol: 223 mg/dL — ABNORMAL HIGH (ref 0–200)
HDL: 40 mg/dL (ref >39.00)
LDL CALC: 158 mg/dL — AB (ref 0–99)
NonHDL: 182.66
Triglycerides: 125 mg/dL (ref 0.0–149.0)
VLDL: 25 mg/dL (ref 0.0–40.0)

## 2015-03-31 LAB — BASIC METABOLIC PANEL
BUN: 21 mg/dL (ref 6–23)
CHLORIDE: 105 meq/L (ref 96–112)
CO2: 27 meq/L (ref 19–32)
Calcium: 9.6 mg/dL (ref 8.4–10.5)
Creatinine, Ser: 1.02 mg/dL (ref 0.40–1.50)
GFR: 73.25 mL/min (ref >60.00)
GLUCOSE: 101 mg/dL — AB (ref 70–99)
POTASSIUM: 4.2 meq/L (ref 3.5–5.1)
Sodium: 143 mEq/L (ref 135–145)

## 2015-03-31 LAB — VITAMIN B12: VITAMIN B 12: 1173 pg/mL — AB (ref 211–911)

## 2015-03-31 LAB — HEPATIC FUNCTION PANEL
ALT: 11 U/L (ref 0–53)
AST: 22 U/L (ref 0–37)
Albumin: 4 g/dL (ref 3.5–5.2)
Alkaline Phosphatase: 74 U/L (ref 39–117)
BILIRUBIN TOTAL: 0.9 mg/dL (ref 0.2–1.2)
Bilirubin, Direct: 0.1 mg/dL (ref 0.0–0.3)
Total Protein: 6.5 g/dL (ref 6.0–8.3)

## 2015-03-31 LAB — TSH: TSH: 2.31 u[IU]/mL (ref 0.35–4.50)

## 2015-03-31 LAB — PSA: PSA: 2.77 ng/mL (ref 0.10–4.00)

## 2015-03-31 LAB — VITAMIN D 25 HYDROXY (VIT D DEFICIENCY, FRACTURES): VITD: 56.03 ng/mL (ref 30.00–100.00)

## 2015-03-31 MED ORDER — TRAMADOL HCL 50 MG PO TABS: 50.0000 mg | ORAL_TABLET | Freq: Two times a day (BID) | ORAL | Status: DC | PRN

## 2015-03-31 NOTE — Patient Instructions (Signed)

## 2015-03-31 NOTE — Assessment & Plan Note (Signed)
S/p CABG Chronic. No angina Toprol, ASA

## 2015-03-31 NOTE — Progress Notes (Signed)
Pre visit review using our clinic review tool, if applicable. No additional management support is needed unless otherwise documented below in the visit note. 

## 2015-03-31 NOTE — Progress Notes (Signed)
Subjective:  Patient ID: Barry Molina, male    DOB: 1925/05/09  Age: 82 y.o. MRN: [id number]  CC: No chief complaint on file.   HPI OXFORD FOGLER presents for a well exam. C/o R knee and L arm pain (Dr Alvan Dame, Dr Tamala Julian)  Outpatient Prescriptions Prior to Visit  Medication Sig Dispense Refill  . aspirin 325 MG tablet Take 325 mg by mouth daily.      . Cholecalciferol (VITAMIN D3) 1000 UNITS CAPS Take by mouth daily.      . cyanocobalamin 1000 MCG tablet Take 1,000 mcg by mouth daily.    . fluticasone (FLONASE) 50 MCG/ACT nasal spray USE 1 SPRAY IN EACH NOSTRIL DAILY AS NEEDED 16 g 1  . furosemide (LASIX) 20 MG tablet Take 1-2 tablets (20-40 mg total) by mouth daily as needed for edema. 60 tablet 1  . levothyroxine (SYNTHROID, LEVOTHROID) 112 MCG tablet Take 1 tablet (112 mcg total) by mouth daily. 90 tablet 3  . metoprolol succinate (TOPROL-XL) 25 MG 24 hr tablet Take 0.5 tablets (12.5 mg total) by mouth daily. 45 tablet 3  . omeprazole (PRILOSEC) 40 MG capsule Take 1 capsule (40 mg total) by mouth daily. 90 capsule 3  . promethazine-codeine (PHENERGAN WITH CODEINE) 6.25-10 MG/5ML syrup Take 5 mLs by mouth every 4 (four) hours as needed for cough. 300 mL 0  . traMADol (ULTRAM) 50 MG tablet Take 1-2 tablets (50-100 mg total) by mouth every 12 (twelve) hours as needed for severe pain. 100 tablet 3  . B-D TB SYRINGE 1CC/27GX1/2" 27G X 1/2" 1 ML MISC USE AS DIRECTED (Patient not taking: Reported on 03/31/2015) 10 each 6  . cyanocobalamin (COBAL-1000) 1000 MCG/ML injection Inject 1 mL (1,000 mcg total) into the muscle every 21 ( twenty-one) days. (Patient not taking: Reported on 03/31/2015) 10 mL 5  . diclofenac sodium (VOLTAREN) 1 % GEL Apply 4 g topically 4 (four) times daily. (Patient not taking: Reported on 03/31/2015) 200 g 3  . Diclofenac Sodium 2 % SOLN Apply 1/2 pump to 1 pump to affected area up to twice daily (Patient not taking: Reported on 03/31/2015) 1 Bottle 2  . Syringe/Needle, Disp,  (B-D ECLIPSE SYRINGE) 27G X 1/2" 1 ML MISC As dirrected (Patient not taking: Reported on 03/31/2015) 50 each 3   Facility-Administered Medications Prior to Visit  Medication Dose Route Frequency Provider Last Rate Last Dose  . methylPREDNISolone acetate (DEPO-MEDROL) injection 40 mg  40 mg Intra-articular Once Jerelyn Trimarco V, MD      . methylPREDNISolone acetate (DEPO-MEDROL) injection 40 mg  40 mg Intra-articular Once Eleanor Dimichele V, MD        ROS Review of Systems  Constitutional: Negative for appetite change, fatigue and unexpected weight change.  HENT: Negative for congestion, nosebleeds, sneezing, sore throat and trouble swallowing.   Eyes: Negative for itching and visual disturbance.  Respiratory: Negative for cough.   Cardiovascular: Negative for chest pain, palpitations and leg swelling.  Gastrointestinal: Negative for nausea, diarrhea, blood in stool and abdominal distention.  Genitourinary: Negative for frequency and hematuria.  Musculoskeletal: Positive for back pain, arthralgias and gait problem. Negative for joint swelling and neck pain.  Skin: Negative for rash.  Neurological: Negative for dizziness, tremors, speech difficulty and weakness.  Psychiatric/Behavioral: Negative for sleep disturbance, dysphoric mood and agitation. The patient is not nervous/anxious.     Objective:  BP 138/83 mmHg  Pulse 72  Wt 208 lb (94.348 kg)  SpO2 92%  BP Readings from  Last 3 Encounters:  03/31/15 138/83  03/25/15 150/90  02/17/15 132/78    Wt Readings from Last 3 Encounters:  03/31/15 208 lb (94.348 kg)  03/25/15 205 lb 12 oz (93.328 kg)  02/17/15 208 lb (94.348 kg)    Physical Exam  Constitutional: He is oriented to person, place, and time. He appears well-developed. No distress.  NAD  HENT:  Mouth/Throat: Oropharynx is clear and moist.  Eyes: Conjunctivae are normal. Pupils are equal, round, and reactive to light.  Neck: Normal range of motion. No JVD present. No  thyromegaly present.  Cardiovascular: Normal rate, regular rhythm, normal heart sounds and intact distal pulses.  Exam reveals no gallop and no friction rub.   No murmur heard. Pulmonary/Chest: Effort normal and breath sounds normal. No respiratory distress. He has no wheezes. He has no rales. He exhibits no tenderness.  Abdominal: Soft. Bowel sounds are normal. He exhibits no distension and no mass. There is no tenderness. There is no rebound and no guarding.  Musculoskeletal: Normal range of motion. He exhibits tenderness. He exhibits no edema.  Lymphadenopathy:    He has no cervical adenopathy.  Neurological: He is alert and oriented to person, place, and time. He has normal reflexes. No cranial nerve deficit. He exhibits normal muscle tone. He displays a negative Romberg sign. Coordination abnormal. Gait normal.  Skin: Skin is warm and dry. No rash noted.  Psychiatric: He has a normal mood and affect. His behavior is normal. Judgment and thought content normal.    Lab Results  Component Value Date   WBC 8.6 03/31/2015   HGB 14.8 03/31/2015   HCT 44.5 03/31/2015   PLT 235.0 03/31/2015   GLUCOSE 101* 03/31/2015   CHOL 223* 03/31/2015   TRIG 125.0 03/31/2015   HDL 40.00 03/31/2015   LDLDIRECT 189.0 01/28/2009   LDLCALC 158* 03/31/2015   ALT 11 03/31/2015   AST 22 03/31/2015   NA 143 03/31/2015   K 4.2 03/31/2015   CL 105 03/31/2015   CREATININE 1.02 03/31/2015   BUN 21 03/31/2015   CO2 27 03/31/2015   TSH 2.31 03/31/2015   PSA 2.77 03/31/2015   INR 1.20 11/03/2010    [place] Extrem Up Left Ltd  02/19/2015  Procedure: Real-time Ultrasound Guided Injection of left glenohumeral joint Device: GE Logiq E  Ultrasound guided injection is preferred based studies that show increased duration, increased effect, greater accuracy, decreased procedural pain, increased response rate with ultrasound guided versus blind injection.  Verbal informed consent obtained.  Time-out conducted.  Noted  no overlying erythema, induration, or other signs of local infection.  Skin prepped in a sterile fashion.  Local anesthesia: Topical Ethyl chloride.  With sterile technique and under real time ultrasound guidance: Joint visualized. 23g 1  inch needle inserted posterior approach. Pictures taken for needle placement. Patient did have injection of 2 cc of 0.5% Marcaine, and 1cc of Kenalog 40 mg/dL. patient did have aspiration of 1 mL of bulky looking fluid removed. Completed without difficulty  Pain immediately resolved suggesting accurate placement of the medication.  Advised to call if fevers/chills, erythema, induration, drainage, or persistent bleeding.  Images permanently stored and available for review in the ultrasound unit.  Impression: Technically successful ultrasound guided injection.  Dg Humerus Left  02/17/2015  CLINICAL DATA:  Chronic pain.  No known injury. EXAM: LEFT HUMERUS - 2+ VIEW COMPARISON:  Chest x-ray 11/01/2010. FINDINGS: Postsurgical changes about the of proximal left upper extremity. No acute bony or joint abnormality. Diffuse osteopenia.  Degenerative changes left shoulder and elbow. Benign tiny dystrophic calcifications noted about the proximal left upper extremity soft tissues. IMPRESSION: 1. No acute abnormality. 2. Postsurgical changes and degenerative changes left shoulder. Degenerative changes left elbow. Diffuse osteopenia. Electronically Signed   By: Marcello Moores  Register   On: 02/17/2015 15:33    Assessment & Plan:   Diagnoses and all orders for this visit:  Well adult exam -     PSA; Future -     Basic metabolic panel; Future -     CBC with Differential/Platelet; Future -     Hepatic function panel; Future -     VITAMIN D 25 Hydroxy (Vit-D Deficiency, Fractures); Future -     Vitamin B12; Future -     Urinalysis; Future -     TSH; Future -     Lipid panel; Future  Essential hypertension -     PSA; Future -     Basic metabolic panel; Future -      Hepatic function panel; Future -     Urinalysis; Future -     TSH; Future -     Lipid panel; Future  Atherosclerosis of native coronary artery of native heart without angina pectoris  CORONARY ARTERY BYPASS GRAFT, HX OF -     CBC with Differential/Platelet; Future -     Vitamin B12; Future -     TSH; Future  B12 deficiency -     CBC with Differential/Platelet; Future  Hypothyroidism due to acquired atrophy of thyroid  Dyslipidemia -     TSH; Future  Vitamin D deficiency -     VITAMIN D 25 Hydroxy (Vit-D Deficiency, Fractures); Future  Other orders -     traMADol (ULTRAM) 50 MG tablet; Take 1-2 tablets (50-100 mg total) by mouth every 12 (twelve) hours as needed for severe pain.  I am having Mr. Cargile maintain his aspirin, Vitamin D3, B-D TB SYRINGE 1CC/27GX1/2", cyanocobalamin, Syringe/Needle (Disp), diclofenac sodium, fluticasone, promethazine-codeine, cyanocobalamin, levothyroxine, metoprolol succinate, omeprazole, furosemide, Diclofenac Sodium, and traMADol. We will continue to administer methylPREDNISolone acetate and methylPREDNISolone acetate.  Meds ordered this encounter  Medications  . traMADol (ULTRAM) 50 MG tablet    Sig: Take 1-2 tablets (50-100 mg total) by mouth every 12 (twelve) hours as needed for severe pain.    Dispense:  100 tablet    Refill:  3     Follow-up: Return in about 6 months (around 09/28/2015) for a follow-up visit.  Walker Kehr, MD

## 2015-03-31 NOTE — Assessment & Plan Note (Signed)
Statin intolerant 

## 2015-03-31 NOTE — Assessment & Plan Note (Signed)
Long term Levothroid Labs

## 2015-03-31 NOTE — Assessment & Plan Note (Signed)
Chronic CAD. No angina Toprol, ASA

## 2015-03-31 NOTE — Assessment & Plan Note (Signed)
Rechecked BP Toprol

## 2015-03-31 NOTE — Assessment & Plan Note (Signed)
Here for medicare wellness/physical  Diet: heart healthy  Physical activity: sedentary  Depression/mood screen: negative  Hearing: intact to whispered voice  Visual acuity: grossly normal, performs annual eye exam  ADLs: capable  Fall risk: moderate Home safety: good  Cognitive evaluation: intact to orientation, naming, recall and repetition  EOL planning: adv directives, full code/ I agree  I have personally reviewed and have noted  1. The patient's medical, surgical and social history  2. Their use of alcohol, tobacco or illicit drugs  3. Their current medications and supplements  4. The patient's functional ability including ADL's, fall risks, home safety risks and hearing or visual impairment.  5. Diet and physical activities  6. Evidence for depression or mood disorders 7. The roster of all physicians providing medical care to patient - is listed in the Snapshot section of the chart and reviewed today.    Today patient counseled on age appropriate routine health concerns for screening and prevention, each reviewed and up to date or declined. Immunizations reviewed and up to date or declined. Labs ordered and reviewed. Risk factors for depression reviewed and negative. Hearing function and visual acuity are intact. ADLs screened and addressed as needed. Functional ability and level of safety reviewed and appropriate. Education, counseling and referrals performed based on assessed risks today. Patient provided with a copy of personalized plan for preventive services.    

## 2015-03-31 NOTE — Assessment & Plan Note (Signed)
On B12 

## 2015-04-05 ENCOUNTER — Observation Stay (HOSPITAL_BASED_OUTPATIENT_CLINIC_OR_DEPARTMENT_OTHER)
Admission: EM | Admit: 2015-04-05 | Discharge: 2015-04-07 | Disposition: A | Discharge status: Home / Self Care | Payer: Medicare Other | Attending: Internal Medicine | Admitting: Internal Medicine

## 2015-04-05 ENCOUNTER — Telehealth: Payer: Self-pay | Admitting: Internal Medicine

## 2015-04-05 ENCOUNTER — Encounter (HOSPITAL_BASED_OUTPATIENT_CLINIC_OR_DEPARTMENT_OTHER): Payer: Self-pay | Admitting: Emergency Medicine

## 2015-04-05 ENCOUNTER — Ambulatory Visit (INDEPENDENT_AMBULATORY_CARE_PROVIDER_SITE_OTHER)
Admission: RE | Admit: 2015-04-05 | Discharge: 2015-04-05 | Disposition: A | Discharge status: Home / Self Care | Payer: Medicare Other | Source: Ambulatory Visit | Attending: Internal Medicine | Admitting: Internal Medicine

## 2015-04-05 DIAGNOSIS — W19XXXA Unspecified fall, initial encounter: Secondary | ICD-10-CM | POA: Diagnosis not present

## 2015-04-05 DIAGNOSIS — Y92099 Unspecified place in other non-institutional residence as the place of occurrence of the external cause: Secondary | ICD-10-CM

## 2015-04-05 DIAGNOSIS — Z85820 Personal history of malignant melanoma of skin: Secondary | ICD-10-CM | POA: Insufficient documentation

## 2015-04-05 DIAGNOSIS — Z951 Presence of aortocoronary bypass graft: Secondary | ICD-10-CM

## 2015-04-05 DIAGNOSIS — Z7982 Long term (current) use of aspirin: Secondary | ICD-10-CM | POA: Insufficient documentation

## 2015-04-05 DIAGNOSIS — I10 Essential (primary) hypertension: Secondary | ICD-10-CM | POA: Diagnosis present

## 2015-04-05 DIAGNOSIS — E538 Deficiency of other specified B group vitamins: Secondary | ICD-10-CM | POA: Diagnosis not present

## 2015-04-05 DIAGNOSIS — Z79899 Other long term (current) drug therapy: Secondary | ICD-10-CM | POA: Insufficient documentation

## 2015-04-05 DIAGNOSIS — R0602 Shortness of breath: Secondary | ICD-10-CM

## 2015-04-05 DIAGNOSIS — K219 Gastro-esophageal reflux disease without esophagitis: Secondary | ICD-10-CM | POA: Diagnosis present

## 2015-04-05 DIAGNOSIS — G9340 Encephalopathy, unspecified: Secondary | ICD-10-CM | POA: Diagnosis not present

## 2015-04-05 DIAGNOSIS — F05 Delirium due to known physiological condition: Secondary | ICD-10-CM | POA: Diagnosis not present

## 2015-04-05 DIAGNOSIS — E039 Hypothyroidism, unspecified: Secondary | ICD-10-CM | POA: Diagnosis present

## 2015-04-05 DIAGNOSIS — E785 Hyperlipidemia, unspecified: Secondary | ICD-10-CM | POA: Diagnosis present

## 2015-04-05 DIAGNOSIS — R4182 Altered mental status, unspecified: Secondary | ICD-10-CM | POA: Diagnosis present

## 2015-04-05 DIAGNOSIS — I2510 Atherosclerotic heart disease of native coronary artery without angina pectoris: Secondary | ICD-10-CM | POA: Diagnosis not present

## 2015-04-05 DIAGNOSIS — C4360 Malignant melanoma of unspecified upper limb, including shoulder: Secondary | ICD-10-CM | POA: Diagnosis present

## 2015-04-05 DIAGNOSIS — Y92009 Unspecified place in unspecified non-institutional (private) residence as the place of occurrence of the external cause: Secondary | ICD-10-CM

## 2015-04-05 LAB — COMPREHENSIVE METABOLIC PANEL
ALBUMIN: 3.8 g/dL (ref 3.5–5.0)
ALK PHOS: 76 U/L (ref 38–126)
ALT: 16 U/L — AB (ref 17–63)
ANION GAP: 10 (ref 5–15)
AST: 31 U/L (ref 15–41)
BUN: 22 mg/dL — AB (ref 6–20)
CALCIUM: 9.1 mg/dL (ref 8.9–10.3)
CO2: 27 mmol/L (ref 22–32)
CREATININE: 1.02 mg/dL (ref 0.61–1.24)
Chloride: 105 mmol/L (ref 101–111)
GFR calc Af Amer: >60 mL/min (ref >60)
GFR calc non Af Amer: >60 mL/min (ref >60)
GLUCOSE: 119 mg/dL — AB (ref 65–99)
Potassium: 3.5 mmol/L (ref 3.5–5.1)
SODIUM: 142 mmol/L (ref 135–145)
Total Bilirubin: 1.1 mg/dL (ref 0.3–1.2)
Total Protein: 6.8 g/dL (ref 6.5–8.1)

## 2015-04-05 LAB — CBC WITH DIFFERENTIAL/PLATELET
Basophils Absolute: 0 10*3/uL (ref 0.0–0.1)
Basophils Relative: 0 %
EOS ABS: 0.1 10*3/uL (ref 0.0–0.7)
Eosinophils Relative: 2 %
HEMATOCRIT: 43.8 % (ref 39.0–52.0)
HEMOGLOBIN: 14.2 g/dL (ref 13.0–17.0)
Lymphocytes Relative: 19 %
Lymphs Abs: 1.5 10*3/uL (ref 0.7–4.0)
MCH: 31.7 pg (ref 26.0–34.0)
MCHC: 32.4 g/dL (ref 30.0–36.0)
MCV: 97.8 fL (ref 78.0–100.0)
MONOS PCT: 10 %
Monocytes Absolute: 0.8 10*3/uL (ref 0.1–1.0)
NEUTROS ABS: 5.4 10*3/uL (ref 1.7–7.7)
Neutrophils Relative %: 69 %
Platelets: 227 10*3/uL (ref 150–400)
RBC: 4.48 MIL/uL (ref 4.22–5.81)
RDW: 14.1 % (ref 11.5–15.5)
WBC: 7.8 10*3/uL (ref 4.0–10.5)

## 2015-04-05 LAB — URINALYSIS, ROUTINE W REFLEX MICROSCOPIC
GLUCOSE, UA: NEGATIVE mg/dL
HGB URINE DIPSTICK: NEGATIVE
Ketones, ur: 15 mg/dL — AB
Leukocytes, UA: NEGATIVE
Nitrite: NEGATIVE
PH: 6 (ref 5.0–8.0)
Protein, ur: NEGATIVE mg/dL
SPECIFIC GRAVITY, URINE: 1.022 (ref 1.005–1.030)

## 2015-04-05 NOTE — Progress Notes (Signed)
Coming to [facility] from [facility].  Pt presented to [facility] with 2-3 days confusion. He is apparently very sharp at baseline, but was not recognizing familiar people. PCP ordered head CT today which shows a basal ganglia infarct not seen on prior study, but uncertain whether new or old. Neuro was consulted and recommends admission for MRI and EEG. Dr. Audie Pinto, EDP and [facility], will send him through the [facility] ED to facilitate getting the MRI.

## 2015-04-05 NOTE — ED Notes (Signed)
Patient was seen by the family on Friday for lunch and was noted to have normal mentation. Patient was then seen on Sunday around lunch time and was very confused. The patients granddaughter talked with a MD that she works for and explained what had transpired and patient has CT based on this MD order. The patient had CT scan and family was called and reported that the CT scan showed that he had had a Stroke. MD recommended to come to ER further testing, The patient is alert and oriented x 1

## 2015-04-05 NOTE — ED Notes (Signed)
Pt.'s family reported that the pt. was sent here from [place]  for MRI , family stated that result of CT scan shows stroke they added that pt. was confused and disoriented last Sunday .

## 2015-04-05 NOTE — Telephone Encounter (Signed)
Pt fell  2 d ago - confused CT  To ER if needed

## 2015-04-05 NOTE — ED Provider Notes (Signed)
CSN: [id number]     Arrival date & time 04/05/15  1931 History  By signing my name below, I, Eustaquio Maize, attest that this documentation has been prepared under the direction and in the presence of Leonard Schwartz, MD. Electronically Signed: Eustaquio Maize, ED Scribe. 04/05/2015. 9:22 PM.     Chief Complaint  Patient presents with  . Altered Mental Status   The history is provided by the patient and a relative. No language interpreter was used.     HPI Comments: Barry Molina is a 82 y.o. male who presents to the Emergency Department complaining of altered mental status that began 1-2 days ago. Daughter reports that family saw pt on Friday, 04/02/2015 (3 days ago) and pt was acting normal. When family visited pt yesterday he seemed very confused which is not pt's baseline. Daughter also mentions that pt didn't watch the [employer] lacrosse game this weekend and did not even know that a game was on which is very unusual for him. Pt was out at lunch today and did not recognize individuals that he knows. Family states that pt has a look of confusion on his face and smiles appropriately but does not respond to individuals he should know. Family called pt's PCP, Dr. Alain Marion, about pt's symptoms and PCP ordered a CT Head which showed that pt had had a stroke. They were told to bring pt to the ED for further evaluation. Family denies weakness in extremities. Pt denies feeling confused currently. He is alert and oriented to person, place, and time.    Past Medical History  Diagnosis Date  . B12 DEFICIENCY 12/09/2007  . COLONIC POLYPS, HX OF 12/14/2006  . CORONARY ARTERY BYPASS GRAFT, HX OF 08/26/2008  . CORONARY ARTERY DISEASE 12/14/2006  . Edema 08/27/2008  . ELBOW PAIN 10/06/2009  . GERD 12/14/2006  . HYPERLIPIDEMIA 12/14/2006  . HYPERTENSION 12/14/2006  . HYPOTHYROIDISM 12/14/2006  . LEG PAIN 05/10/2010  . MUSCLE PAIN 04/10/2007  . NEOPLASM, SKIN, UNCERTAIN BEHAVIOR [id number]  . Other specified acquired  hypothyroidism 04/08/2007  . Personal history of other diseases of digestive disease 08/26/2008  . SHOULDER PAIN 12/19/2006  . Cataract   . Malignant melanoma of skin of upper limb, including shoulder ([place]) 09/07/2008    states he just had places removed;  not cancers   Past Surgical History  Procedure Laterality Date  . Right ankle surgery    . Coronary artery bypass graft    . Rotator cuff repair      bilateral  . Spine surgery  8/12    LS spine cyst  . Back surgery  09/27/10  . Cholecystectomy  9/12   Family History  Problem Relation Age of Onset  . Heart disease Other   . Hypertension Father   . Cancer Father   . Stroke Mother 21   Social History  Substance Use Topics  . Smoking status: Never Smoker   . Smokeless tobacco: None  . Alcohol Use: No    Review of Systems  A complete 10 system review of systems was obtained and all systems are negative except as noted in the HPI and PMH.   Allergies  Ezetimibe-simvastatin and Morphine  Home Medications   Prior to Admission medications   Medication Sig Start Date End Date Taking? Authorizing Provider  aspirin 325 MG tablet Take 325 mg by mouth daily.      Historical Provider, MD  B-D TB SYRINGE 1CC/27GX1/2" 27G X 1/2" 1 ML MISC USE AS DIRECTED Patient  not taking: Reported on 03/31/2015 10/17/10   Cassandria Anger, MD  Cholecalciferol (VITAMIN D3) 1000 UNITS CAPS Take by mouth daily.      Historical Provider, MD  cyanocobalamin (COBAL-1000) 1000 MCG/ML injection Inject 1 mL (1,000 mcg total) into the muscle every 21 ( twenty-one) days. Patient not taking: Reported on 03/31/2015 12/06/11   Cassandria Anger, MD  cyanocobalamin 1000 MCG tablet Take 1,000 mcg by mouth daily.    Historical Provider, MD  diclofenac sodium (VOLTAREN) 1 % GEL Apply 4 g topically 4 (four) times daily. Patient not taking: Reported on 03/31/2015 12/06/11   Cassandria Anger, MD  Diclofenac Sodium 2 % SOLN Apply 1/2 pump to 1 pump to affected area up to  twice daily Patient not taking: Reported on 03/31/2015 12/22/14   Lyndal Pulley, DO  fluticasone [facility]) 50 MCG/ACT nasal spray USE 1 SPRAY IN EACH NOSTRIL DAILY AS NEEDED 02/14/12   Lew Dawes V, MD  furosemide (LASIX) 20 MG tablet Take 1-2 tablets (20-40 mg total) by mouth daily as needed for edema. 08/04/14   Aleksei Plotnikov V, MD  levothyroxine (SYNTHROID, LEVOTHROID) 112 MCG tablet Take 1 tablet (112 mcg total) by mouth daily. 08/04/14   Aleksei Plotnikov V, MD  metoprolol succinate (TOPROL-XL) 25 MG 24 hr tablet Take 0.5 tablets (12.5 mg total) by mouth daily. 08/04/14   Aleksei Plotnikov V, MD  omeprazole (PRILOSEC) 40 MG capsule Take 1 capsule (40 mg total) by mouth daily. 08/04/14   Aleksei Plotnikov V, MD  promethazine-codeine (PHENERGAN WITH CODEINE) 6.25-10 MG/5ML syrup Take 5 mLs by mouth every 4 (four) hours as needed for cough. 02/24/13   Aleksei Plotnikov V, MD  Syringe/Needle, Disp, (B-D ECLIPSE SYRINGE) 27G X 1/2" 1 ML MISC As dirrected Patient not taking: Reported on 03/31/2015 12/06/11   Lew Dawes V, MD  traMADol (ULTRAM) 50 MG tablet Take 1-2 tablets (50-100 mg total) by mouth every 12 (twelve) hours as needed for severe pain. 03/31/15   Aleksei Plotnikov V, MD   BP 149/100 mmHg  Pulse 90  Temp(Src) 99 F (37.2 C) (Oral)  Resp 18  Ht 5\' 11"  (1.803 m)  Wt 208 lb (94.348 kg)  BMI 29.02 kg/m2  SpO2 94%   Physical Exam  Constitutional: He appears well-developed and well-nourished. No distress.  HENT:  Head: Normocephalic and atraumatic.  Eyes: Pupils are equal, round, and reactive to light.  Neck: Normal range of motion.  Cardiovascular: Normal rate and intact distal pulses.   Pulmonary/Chest: No respiratory distress.  Abdominal: Normal appearance. He exhibits no distension. There is no tenderness. There is no rebound.  Musculoskeletal: Normal range of motion.  Neurological: He is alert. No cranial nerve deficit. GCS eye subscore is 4. GCS verbal subscore is 5.  GCS motor subscore is 6.  Patient did seem confused at times but was oriented to person place and time.  Skin: Skin is warm and dry. No rash noted.  Psychiatric: He has a normal mood and affect. His behavior is normal.  Nursing note and vitals reviewed.   ED Course  Procedures (including critical care time)  DIAGNOSTIC STUDIES: Oxygen Saturation is 96% on RA, normal by my interpretation.    COORDINATION OF CARE: 9:19 PM-Discussed treatment plan which includes consult with neurologist with pt/family member at bedside and pt/family member agreed to plan.   Labs Review Labs Reviewed  CBC WITH DIFFERENTIAL/PLATELET  COMPREHENSIVE METABOLIC PANEL  URINALYSIS, ROUTINE W REFLEX MICROSCOPIC (NOT AT [facility])    Imaging  Review Ct Head Wo Contrast  04/05/2015  CLINICAL DATA:  Fall at home 2 days ago.  Intermittent confusion. EXAM: CT HEAD WITHOUT CONTRAST TECHNIQUE: Contiguous axial images were obtained from the base of the skull through the vertex without intravenous contrast. COMPARISON:  CT head without contrast 05/28/2013 FINDINGS: Progressive moderate periventricular and subcortical white matter hypoattenuation is noted bilaterally. Mild to moderate generalized atrophy is present. A lacunar infarct in the right caudate is new since prior exam, but appears acute. No acute hemorrhage or mass lesion is present. There is no significant extra-axial fluid collection. The ventricles are proportionate to the degree of atrophy. The paranasal sinuses and mastoid air cells are clear. The calvarium is intact. No significant extracranial soft tissue lesion is present. IMPRESSION: 1. No acute intracranial abnormality or significant interval change. 2. Progressive white matter disease. 3. Lacunar infarct of the right basal ganglia was not present on the prior exam but appears remote. Electronically Signed   By: San Morelle M.D.   On: 04/05/2015 15:18   I have personally reviewed and evaluated these images  and lab results as part of my medical decision-making.   EKG Interpretation   Date/Time:  Monday April 05 2015 21:28:50 EST Ventricular Rate:  91 PR Interval:  202 QRS Duration: 165 QT Interval:  404 QTC Calculation: 497 R Axis:   37 Text Interpretation:  Sinus rhythm Right bundle branch block Inferior  infarct, age indeterminate Lateral leads are also involved No significant  change since last tracing Confirmed by Zivah Mayr  MD, Ieasha Boerema [phone]) on  04/05/2015 9:34:05 PM     I spoke with the neurologist Dr. Silverio Decamp who recommended the patient be admitted at [facility].  I discussed this with Dr. Rex Kras and Dr.Opyd(hospitalist).  Patient is stable to be transferred by private vehicle.  We will notify the nursing staff at [facility] to expedite him getting a room is seen as he gets there.  Preferably in Pod A.  MDM   Final diagnoses:  Altered mental status, unspecified altered mental status type    I personally performed the services described in this documentation, which was scribed in my presence. The recorded information has been reviewed and considered.    Leonard Schwartz, MD 04/05/15 2208

## 2015-04-06 ENCOUNTER — Inpatient Hospital Stay (HOSPITAL_COMMUNITY): Payer: Medicare Other

## 2015-04-06 ENCOUNTER — Emergency Department (HOSPITAL_COMMUNITY): Payer: Medicare Other

## 2015-04-06 ENCOUNTER — Ambulatory Visit (HOSPITAL_COMMUNITY): Payer: Medicare Other

## 2015-04-06 ENCOUNTER — Ambulatory Visit: Payer: Medicare Other | Admitting: Internal Medicine

## 2015-04-06 DIAGNOSIS — I2510 Atherosclerotic heart disease of native coronary artery without angina pectoris: Secondary | ICD-10-CM | POA: Diagnosis not present

## 2015-04-06 DIAGNOSIS — Z951 Presence of aortocoronary bypass graft: Secondary | ICD-10-CM

## 2015-04-06 DIAGNOSIS — I10 Essential (primary) hypertension: Secondary | ICD-10-CM | POA: Diagnosis not present

## 2015-04-06 DIAGNOSIS — K219 Gastro-esophageal reflux disease without esophagitis: Secondary | ICD-10-CM | POA: Diagnosis not present

## 2015-04-06 DIAGNOSIS — R4189 Other symptoms and signs involving cognitive functions and awareness: Secondary | ICD-10-CM

## 2015-04-06 DIAGNOSIS — E038 Other specified hypothyroidism: Secondary | ICD-10-CM

## 2015-04-06 DIAGNOSIS — G9340 Encephalopathy, unspecified: Secondary | ICD-10-CM | POA: Diagnosis not present

## 2015-04-06 DIAGNOSIS — R4182 Altered mental status, unspecified: Secondary | ICD-10-CM | POA: Diagnosis not present

## 2015-04-06 DIAGNOSIS — E034 Atrophy of thyroid (acquired): Secondary | ICD-10-CM

## 2015-04-06 LAB — BASIC METABOLIC PANEL
Anion gap: 12 (ref 5–15)
BUN: 18 mg/dL (ref 6–20)
CALCIUM: 9.1 mg/dL (ref 8.9–10.3)
CO2: 24 mmol/L (ref 22–32)
CREATININE: 1.02 mg/dL (ref 0.61–1.24)
Chloride: 104 mmol/L (ref 101–111)
GFR calc Af Amer: >60 mL/min (ref >60)
Glucose, Bld: 103 mg/dL — ABNORMAL HIGH (ref 65–99)
Potassium: 3.4 mmol/L — ABNORMAL LOW (ref 3.5–5.1)
SODIUM: 140 mmol/L (ref 135–145)

## 2015-04-06 LAB — PROTIME-INR
INR: 1.17 (ref 0.00–1.49)
Prothrombin Time: 15.1 seconds (ref 11.6–15.2)

## 2015-04-06 LAB — CBC
HCT: 42.3 % (ref 39.0–52.0)
Hemoglobin: 13.6 g/dL (ref 13.0–17.0)
MCH: 31.1 pg (ref 26.0–34.0)
MCHC: 32.2 g/dL (ref 30.0–36.0)
MCV: 96.8 fL (ref 78.0–100.0)
PLATELETS: 207 10*3/uL (ref 150–400)
RBC: 4.37 MIL/uL (ref 4.22–5.81)
RDW: 14.1 % (ref 11.5–15.5)
WBC: 7.5 10*3/uL (ref 4.0–10.5)

## 2015-04-06 LAB — TROPONIN I
TROPONIN I: 0.04 ng/mL — AB (ref <0.031)
TROPONIN I: 0.05 ng/mL — AB (ref <0.031)
Troponin I: 0.04 ng/mL — ABNORMAL HIGH (ref <0.031)

## 2015-04-06 LAB — VITAMIN B12: Vitamin B-12: 975 pg/mL — ABNORMAL HIGH (ref 180–914)

## 2015-04-06 LAB — TSH: TSH: 8.041 u[IU]/mL — ABNORMAL HIGH (ref 0.350–4.500)

## 2015-04-06 LAB — APTT: APTT: 31 s (ref 24–37)

## 2015-04-06 LAB — GLUCOSE, CAPILLARY: GLUCOSE-CAPILLARY: 83 mg/dL (ref 65–99)

## 2015-04-06 LAB — AMMONIA: Ammonia: 36 umol/L — ABNORMAL HIGH (ref 9–35)

## 2015-04-06 MED ORDER — HYDRALAZINE HCL 20 MG/ML IJ SOLN: 5.0000 mg | INTRAMUSCULAR | Status: DC | PRN

## 2015-04-06 MED ORDER — ONDANSETRON HCL 4 MG/2ML IJ SOLN: 4.0000 mg | Freq: Four times a day (QID) | INTRAMUSCULAR | Status: DC | PRN

## 2015-04-06 MED ORDER — GADOBENATE DIMEGLUMINE 529 MG/ML IV SOLN
20.0000 mL | Freq: Once | INTRAVENOUS | Status: AC | PRN
  Administered 2015-04-06: 20 mL via INTRAVENOUS

## 2015-04-06 MED ORDER — ENOXAPARIN SODIUM 40 MG/0.4ML ~~LOC~~ SOLN
40.0000 mg | SUBCUTANEOUS | Status: DC
  Administered 2015-04-06 – 2015-04-07 (×2): 40 mg via SUBCUTANEOUS
  Filled 2015-04-06 (×2): qty 0.4

## 2015-04-06 MED ORDER — FLUTICASONE PROPIONATE 50 MCG/ACT NA SUSP
1.0000 | Freq: Every day | NASAL | Status: DC
  Filled 2015-04-06: qty 16

## 2015-04-06 MED ORDER — SODIUM CHLORIDE 0.9% FLUSH
3.0000 mL | Freq: Two times a day (BID) | INTRAVENOUS | Status: DC
Start: 2015-04-06 — End: 2015-04-07
  Administered 2015-04-06 (×2): 3 mL via INTRAVENOUS

## 2015-04-06 MED ORDER — POTASSIUM CHLORIDE CRYS ER 20 MEQ PO TBCR
40.0000 meq | EXTENDED_RELEASE_TABLET | Freq: Once | ORAL | Status: AC
  Administered 2015-04-06: 40 meq via ORAL
  Filled 2015-04-06: qty 2

## 2015-04-06 MED ORDER — LEVOTHYROXINE SODIUM 25 MCG PO TABS
125.0000 ug | ORAL_TABLET | Freq: Every day | ORAL | Status: DC
  Administered 2015-04-07: 125 ug via ORAL
  Filled 2015-04-06: qty 1

## 2015-04-06 MED ORDER — VITAMIN D 1000 UNITS PO TABS
1000.0000 [IU] | ORAL_TABLET | Freq: Every day | ORAL | Status: DC
  Administered 2015-04-06 – 2015-04-07 (×2): 1000 [IU] via ORAL
  Filled 2015-04-06 (×2): qty 1

## 2015-04-06 MED ORDER — VITAMIN B-12 1000 MCG PO TABS
1000.0000 ug | ORAL_TABLET | Freq: Every day | ORAL | Status: DC
  Administered 2015-04-06 – 2015-04-07 (×2): 1000 ug via ORAL
  Filled 2015-04-06 (×2): qty 1

## 2015-04-06 MED ORDER — SODIUM CHLORIDE 0.9 % IV SOLN
INTRAVENOUS | Status: DC
  Administered 2015-04-06: 04:00:00 via INTRAVENOUS

## 2015-04-06 MED ORDER — METOPROLOL SUCCINATE ER 25 MG PO TB24
12.5000 mg | ORAL_TABLET | Freq: Every day | ORAL | Status: DC
  Administered 2015-04-06 – 2015-04-07 (×2): 12.5 mg via ORAL
  Filled 2015-04-06 (×2): qty 1

## 2015-04-06 MED ORDER — ACETAMINOPHEN 650 MG RE SUPP: 650.0000 mg | Freq: Four times a day (QID) | RECTAL | Status: DC | PRN

## 2015-04-06 MED ORDER — TRAMADOL HCL 50 MG PO TABS: 50.0000 mg | ORAL_TABLET | Freq: Two times a day (BID) | ORAL | Status: DC | PRN

## 2015-04-06 MED ORDER — PANTOPRAZOLE SODIUM 40 MG PO TBEC
40.0000 mg | DELAYED_RELEASE_TABLET | Freq: Every day | ORAL | Status: DC
  Administered 2015-04-06 – 2015-04-07 (×2): 40 mg via ORAL
  Filled 2015-04-06 (×2): qty 1

## 2015-04-06 MED ORDER — ASPIRIN 325 MG PO TABS
325.0000 mg | ORAL_TABLET | Freq: Every day | ORAL | Status: DC
  Administered 2015-04-06 – 2015-04-07 (×2): 325 mg via ORAL
  Filled 2015-04-06 (×2): qty 1

## 2015-04-06 MED ORDER — LEVOTHYROXINE SODIUM 112 MCG PO TABS
112.0000 ug | ORAL_TABLET | Freq: Every day | ORAL | Status: DC
Start: 2015-04-06 — End: 2015-04-06
  Administered 2015-04-06: 112 ug via ORAL
  Filled 2015-04-06: qty 1

## 2015-04-06 MED ORDER — ONDANSETRON HCL 4 MG PO TABS: 4.0000 mg | ORAL_TABLET | Freq: Four times a day (QID) | ORAL | Status: DC | PRN

## 2015-04-06 MED ORDER — SODIUM CHLORIDE 0.9 % IV SOLN: INTRAVENOUS | Status: DC

## 2015-04-06 MED ORDER — ACETAMINOPHEN 325 MG PO TABS: 650.0000 mg | ORAL_TABLET | Freq: Four times a day (QID) | ORAL | Status: DC | PRN

## 2015-04-06 NOTE — Care Management Note (Signed)
Case Management Note  Patient Details  Name: Barry Molina MRN: [id number] Date of Birth: Jul 13, 1925  Subjective/Objective:                    Action/Plan: Patient presented with altered mental status. Lives at home alone with daughter living close by. Will follow for discharge needs pending PT/OT evals and physician orders.  Expected Discharge Date:                  Expected Discharge Plan:     In-House Referral:     Discharge planning Services     Post Acute Care Choice:    Choice offered to:     DME Arranged:    DME Agency:     HH Arranged:    HH Agency:     Status of Service:  In process, will continue to follow  Medicare Important Message Given:    Date Medicare IM Given:    Medicare IM give by:    Date Additional Medicare IM Given:    Additional Medicare Important Message give by:     If discussed at [place] of Stay Meetings, dates discussed:    Additional CommentsRolm Baptise, RN 04/06/2015, 11:00 AM [phone]

## 2015-04-06 NOTE — Consult Note (Signed)
Requesting Physician: Dr.  Audie Pinto, ER    Reason for consultation: Altered mental status  HPI:                                                                                                                                         Barry Molina is an 82 y.o. male patient who was brought into the ER by his daughter for worsening confusion and altered mental status. Patient lives alone and his daughter lives close by half a mile away, frequent checks on him. For the past couple of days she noted that he's been more confused compared to his baseline. Hence patient was brought in for further evaluation to [facility] ER, and was eventually transferred to the [facility] ER for further neurological evaluation. A CT of the head was done in the [place] ER, which showed chronic microvessel changes might matter, a chronic right basal ganglia lacunar infarct and also a focal hypodensity in the left posterior parietal subcortical area likely a small chronic infarct.   No history of any recent head trauma or seizures or falls. Patient is noted to have a UTI in the ER.   Patient unable to provide any further history. He is in fact annoyed that he was brought into the hospital and does not think anything is wrong with him at this time.   Past Medical History: Past Medical History  Diagnosis Date  . B12 DEFICIENCY 12/09/2007  . COLONIC POLYPS, HX OF 12/14/2006  . CORONARY ARTERY BYPASS GRAFT, HX OF 08/26/2008  . CORONARY ARTERY DISEASE 12/14/2006  . Edema 08/27/2008  . ELBOW PAIN 10/06/2009  . GERD 12/14/2006  . HYPERLIPIDEMIA 12/14/2006  . HYPERTENSION 12/14/2006  . HYPOTHYROIDISM 12/14/2006  . LEG PAIN 05/10/2010  . MUSCLE PAIN 04/10/2007  . NEOPLASM, SKIN, UNCERTAIN BEHAVIOR [id number]  . Other specified acquired hypothyroidism 04/08/2007  . Personal history of other diseases of digestive disease 08/26/2008  . SHOULDER PAIN 12/19/2006  . Cataract   . 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 Right ankle surgery    . Coronary artery bypass graft    . Rotator cuff repair      bilateral  . Spine surgery  8/12    LS spine cyst  . Back surgery  09/27/10  . Cholecystectomy  9/12    Family History: Family History  Problem Relation Age of Onset  . Heart disease Other   . Hypertension Father   . Cancer Father   . Stroke Mother 18    Social History:   reports that he has never smoked. He does not have any smokeless tobacco history on file. He reports that he does not drink alcohol or use illicit drugs.  Allergies:  Allergies  Allergen Reactions  . Ezetimibe-Simvastatin Other (See Comments)     aches  . Morphine Nausea And Vomiting     Medications:                                                                                                                         Current facility-administered medications:  .  methylPREDNISolone acetate (DEPO-MEDROL) injection 40 mg, 40 mg, Intra-articular, Once, Aleksei Plotnikov V, MD .  methylPREDNISolone acetate (DEPO-MEDROL) injection 40 mg, 40 mg, Intra-articular, Once, Aleksei Plotnikov V, MD  Current outpatient prescriptions:  .  aspirin 325 MG tablet, Take 325 mg by mouth daily.  , Disp: , Rfl:  .  B-D TB SYRINGE 1CC/27GX1/2" 27G X 1/2" 1 ML MISC, USE AS DIRECTED (Patient not taking: Reported on 03/31/2015), Disp: 10 each, Rfl: 6 .  Cholecalciferol (VITAMIN D3) 1000 UNITS CAPS, Take by mouth daily.  , Disp: , Rfl:  .  cyanocobalamin (COBAL-1000) 1000 MCG/ML injection, Inject 1 mL (1,000 mcg total) into the muscle every 21 ( twenty-one) days. (Patient not taking: Reported on 03/31/2015), Disp: 10 mL, Rfl: 5 .  cyanocobalamin 1000 MCG tablet, Take 1,000 mcg by mouth daily., Disp: , Rfl:  .  diclofenac sodium (VOLTAREN) 1 % GEL, Apply 4 g topically 4 (four) times daily. (Patient not taking:  Reported on 03/31/2015), Disp: 200 g, Rfl: 3 .  Diclofenac Sodium 2 % SOLN, Apply 1/2 pump to 1 pump to affected area up to twice daily (Patient not taking: Reported on 03/31/2015), Disp: 1 Bottle, Rfl: 2 .  fluticasone (FLONASE) 50 MCG/ACT nasal spray, USE 1 SPRAY IN EACH NOSTRIL DAILY AS NEEDED, Disp: 16 g, Rfl: 1 .  furosemide (LASIX) 20 MG tablet, Take 1-2 tablets (20-40 mg total) by mouth daily as needed for edema., Disp: 60 tablet, Rfl: 1 .  levothyroxine (SYNTHROID, LEVOTHROID) 112 MCG tablet, Take 1 tablet (112 mcg total) by mouth daily., Disp: 90 tablet, Rfl: 3 .  metoprolol succinate (TOPROL-XL) 25 MG 24 hr tablet, Take 0.5 tablets (12.5 mg total) by mouth daily., Disp: 45 tablet, Rfl: 3 .  omeprazole (PRILOSEC) 40 MG capsule, Take 1 capsule (40 mg total) by mouth daily., Disp: 90 capsule, Rfl: 3 .  promethazine-codeine (PHENERGAN WITH CODEINE) 6.25-10 MG/5ML syrup, Take 5 mLs by mouth every 4 (four) hours as needed for cough., Disp: 300 mL, Rfl: 0 .  Syringe/Needle, Disp, (B-D ECLIPSE SYRINGE) 27G X 1/2" 1 ML MISC, As dirrected (Patient not taking: Reported on 03/31/2015), Disp: 50 each, Rfl: 3 .  traMADol (ULTRAM) 50 MG tablet, Take 1-2 tablets (50-100 mg total) by mouth every 12 (twelve) hours as needed for severe pain., Disp: 100 tablet, Rfl: 3   ROS:  History unobtainable from patient due to age  Neurologic Examination:                                                                                                      Blood pressure 143/98, pulse 87, temperature 98 F (36.7 C), temperature source Oral, resp. rate 20, height 5\' 11"  (1.803 m), weight 94.348 kg (208 lb), SpO2 93 %.  Evaluation of higher integrative functions including: Level of alertness: Alert,  Oriented to time, place and person Recent and remote memory -impaired  Attention span and  concentration  -mildly reduced  Speech: fluent, no evidence of dysarthria or aphasia noted.  Test the following cranial nerves: 2-12 grossly intact Motor examination: Normal tone, bulk, full 5/5 motor strength in all 4 extremities, except for limited left shoulder abduction due to chronic left shoulder pain  Examination of sensation :Grossly  symmetric sensation in all 4 extremities and on face Examination of deep tendon reflexes: 1+,and symmetric  proximally in all extremities, normal plantars bilaterally Test coordination: Normal finger nose testing, with no evidence of limb appendicular ataxia.  Gait: Deferred   Lab Results: Basic Metabolic Panel:  Recent Labs Lab 03/31/15 1053 04/05/15 2150  NA 143 142  K 4.2 3.5  CL 105 105  CO2 27 27  GLUCOSE 101* 119*  BUN 21 22*  CREATININE 1.02 1.02  CALCIUM 9.6 9.1    Liver Function Tests:  Recent Labs Lab 03/31/15 1053 04/05/15 2150  AST 22 31  ALT 11 16*  ALKPHOS 74 76  BILITOT 0.9 1.1  PROT 6.5 6.8  ALBUMIN 4.0 3.8   No results for input(s): LIPASE, AMYLASE in the last 168 hours. No results for input(s): AMMONIA in the last 168 hours.  CBC:  Recent Labs Lab 03/31/15 1053 04/05/15 2150  WBC 8.6 7.8  NEUTROABS 6.0 5.4  HGB 14.8 14.2  HCT 44.5 43.8  MCV 96.0 97.8  PLT 235.0 227    Cardiac Enzymes: No results for input(s): CKTOTAL, CKMB, CKMBINDEX, TROPONINI in the last 168 hours.  Lipid Panel:  Recent Labs Lab 03/31/15 1053  CHOL 223*  TRIG 125.0  HDL 40.00  CHOLHDL 6  VLDL 25.0  LDLCALC 158*    CBG: No results for input(s): GLUCAP in the last 168 hours.  Microbiology: Results for orders placed or performed during the hospital encounter of 11/01/10  Surgical pcr screen     Status: None   Collection Time: 11/03/10  8:20 AM  Result Value Ref Range Status   MRSA, PCR NEGATIVE NEGATIVE Final   Staphylococcus aureus NEGATIVE NEGATIVE Final    Comment:        The Xpert SA Assay (FDA approved for  NASAL specimens only), is one component of a comprehensive surveillance program.  It is not intended to diagnose infection nor to guide or monitor treatment.     Imaging: Ct Head Wo Contrast  04/05/2015  CLINICAL DATA:  Fall at home 2 days ago.  Intermittent confusion. EXAM: CT HEAD WITHOUT CONTRAST TECHNIQUE: Contiguous axial images were obtained from the base of the skull  through the vertex without intravenous contrast. COMPARISON:  CT head without contrast 05/28/2013 FINDINGS: Progressive moderate periventricular and subcortical white matter hypoattenuation is noted bilaterally. Mild to moderate generalized atrophy is present. A lacunar infarct in the right caudate is new since prior exam, but appears acute. No acute hemorrhage or mass lesion is present. There is no significant extra-axial fluid collection. The ventricles are proportionate to the degree of atrophy. The paranasal sinuses and mastoid air cells are clear. The calvarium is intact. No significant extracranial soft tissue lesion is present. IMPRESSION: 1. No acute intracranial abnormality or significant interval change. 2. Progressive white matter disease. 3. Lacunar infarct of the right basal ganglia was not present on the prior exam but appears remote. Electronically Signed   By: San Morelle M.D.   On: 04/05/2015 15:18    Assessment and plan:   Barry Molina is an 83 y.o. male patient who was brought into the ER for further evaluation of worsening confusion and altered mental status for the past couple of days noted by his daughter. Patient is oriented, with fluent speech, no aphasia. Appears to have some difficulty with serial 7 calculations, likely age associated mild cognitive impairment. He is noted to have UTI. That could've caused some decompensation of his underlying cognitive impairment.  Nevertheless, given the history of baseline high functional status with acute deterioration noted by family, recommend further  neurodiagnostic workup to rule out any acute pathology, with MRI of the brain without the contrast and an EEG in the morning. Patient will be admitted to hospitalist service for overnight monitoring and completion of the neurodiagnostic workup. Discussed with patient's daughter, answered several of her questions to her satisfaction.   Discussed with ER physician.

## 2015-04-06 NOTE — Progress Notes (Signed)
EEG Completed; Results Pending  

## 2015-04-06 NOTE — H&P (Signed)
[facility] History and Physical  Barry Molina [id number] DOB: 10/21/1925 DOA: 04/05/2015  Referring physician: ED physician PCP: Walker Kehr, MD  Specialists:   Chief Complaint: Altered mental status  HPI: Barry Molina is a 82 y.o. male with PMH of hypertension, hyperlipidemia, GERD, hypothyroidism, CAD, S/P of CABG, B12 deficiency, skin melanoma, who presents with altered mental status.  Per patient's daughter, he lives alone and his daughter lives close by half a mile away, frequent checks on him. Daughter noticed that patient has become confused for at least 2 days. Patient has generalized weakness, but no unilateral weakness, or numbness. Patient has chronic bilateral knee problems, which has not changed. Patient does not have chest pain, shortness breath, abdominal pain, diarrhea or symptoms of UTI. His PCP ordered a CT head on Monday, which showed possible stroke. Patient was brought to the [facility] ER for further evaluation and treatment. A CT of the head was done in the [place] ER, which showed chronic microvessel changes might matter, right basal ganglia lacunar infarct and also a focal hypodensity in the left posterior parietal subcortical area concerning for a small chronic infarct. Therefore pt was transferred to [facility] for neuro evaluation.  In ED, patient was found to have negative urinalysis, WBC 7.8, temperature normal, electrolytes and renal function okay. 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 Patient is admitted to inpatient for further evaluate and treatment.  EKG: Independently reviewed.  QTC 497, old right bundle blockage, which existed in  previous EKG on 10/30/10.  Where does patient live?   At home    Can patient participate in ADLs?  None  Review of Systems: Could not be reviewed accurately due to altered mental status.  Allergy:  Allergies  Allergen Reactions  . Ezetimibe-Simvastatin Other (See Comments)     aches  . Morphine Nausea And Vomiting    Past Medical History  Diagnosis Date  . B12 DEFICIENCY 12/09/2007  . COLONIC POLYPS, HX OF 12/14/2006  . CORONARY ARTERY BYPASS GRAFT, HX OF 08/26/2008  . CORONARY ARTERY DISEASE 12/14/2006  . Edema 08/27/2008  . ELBOW PAIN 10/06/2009  . GERD 12/14/2006  . HYPERLIPIDEMIA 12/14/2006  . HYPERTENSION 12/14/2006  . HYPOTHYROIDISM 12/14/2006  . LEG PAIN 05/10/2010  . MUSCLE PAIN 04/10/2007  . NEOPLASM, SKIN, UNCERTAIN BEHAVIOR Q000111Q  . Other specified acquired hypothyroidism 04/08/2007  . Personal history of other diseases of digestive disease 08/26/2008  . SHOULDER PAIN 12/19/2006  . Cataract   . Malignant melanoma of skin of upper limb, including shoulder ([place]) 09/07/2008    states he just had places removed;  not cancers    Past Surgical History  Procedure Laterality Date  . Right ankle surgery    . Coronary artery bypass graft    . Rotator cuff repair      bilateral  . Spine surgery  8/12    LS spine cyst  . Back surgery  09/27/10  . Cholecystectomy  9/12    Social History:  reports that he has never smoked. He does not have any smokeless tobacco history on file. He reports that he does not drink alcohol or use illicit drugs.  Family History:  Family History  Problem Relation Age of Onset  .  Heart disease Other   . Hypertension Father   . Cancer Father   . Stroke Mother 79     Prior to Admission medications   Medication Sig Start Date End Date Taking? Authorizing Provider  aspirin 325 MG tablet Take 325 mg by mouth daily.      Historical Provider, MD  B-D TB SYRINGE 1CC/27GX1/2" 27G X 1/2" 1 ML MISC USE AS DIRECTED Patient not taking: Reported on  03/31/2015 10/17/10   Lew Dawes V, MD  Cholecalciferol (VITAMIN D3) 1000 UNITS CAPS Take by mouth daily.      Historical Provider, MD  cyanocobalamin (COBAL-1000) 1000 MCG/ML injection Inject 1 mL (1,000 mcg total) into the muscle every 21 ( twenty-one) days. Patient not taking: Reported on 03/31/2015 12/06/11   Cassandria Anger, MD  cyanocobalamin 1000 MCG tablet Take 1,000 mcg by mouth daily.    Historical Provider, MD  diclofenac sodium (VOLTAREN) 1 % GEL Apply 4 g topically 4 (four) times daily. Patient not taking: Reported on 03/31/2015 12/06/11   Cassandria Anger, MD  Diclofenac Sodium 2 % SOLN Apply 1/2 pump to 1 pump to affected area up to twice daily Patient not taking: Reported on 03/31/2015 12/22/14   Lyndal Pulley, DO  fluticasone [facility]) 50 MCG/ACT nasal spray USE 1 SPRAY IN EACH NOSTRIL DAILY AS NEEDED 02/14/12   Lew Dawes V, MD  furosemide (LASIX) 20 MG tablet Take 1-2 tablets (20-40 mg total) by mouth daily as needed for edema. 08/04/14   Aleksei Plotnikov V, MD  levothyroxine (SYNTHROID, LEVOTHROID) 112 MCG tablet Take 1 tablet (112 mcg total) by mouth daily. 08/04/14   Aleksei Plotnikov V, MD  metoprolol succinate (TOPROL-XL) 25 MG 24 hr tablet Take 0.5 tablets (12.5 mg total) by mouth daily. 08/04/14   Aleksei Plotnikov V, MD  omeprazole (PRILOSEC) 40 MG capsule Take 1 capsule (40 mg total) by mouth daily. 08/04/14   Aleksei Plotnikov V, MD  promethazine-codeine (PHENERGAN WITH CODEINE) 6.25-10 MG/5ML syrup Take 5 mLs by mouth every 4 (four) hours as needed for cough. 02/24/13   Aleksei Plotnikov V, MD  Syringe/Needle, Disp, (B-D ECLIPSE SYRINGE) 27G X 1/2" 1 ML MISC As dirrected Patient not taking: Reported on 03/31/2015 12/06/11   Lew Dawes V, MD  traMADol (ULTRAM) 50 MG tablet Take 1-2 tablets (50-100 mg total) by mouth every 12 (twelve) hours as needed for severe pain. 03/31/15   Cassandria Anger, MD    Physical Exam: Filed Vitals:   04/06/15 0037 04/06/15  0215 04/06/15 0345 04/06/15 0400  BP:  169/89 146/82 153/65  Pulse: 87 77 76 68  Temp: 98 F (36.7 C)     TempSrc: Oral     Resp: 20 18    Height:      Weight:      SpO2: 93% 92% 93% 93%   General: Not in acute distress HEENT:       Eyes: PERRL, EOMI, no scleral icterus.       ENT: No discharge from the ears and nose, no pharynx injection, no tonsillar enlargement.        Neck: No JVD, no bruit, no mass felt. Heme: No neck lymph node enlargement. Cardiac: S1/S2, RRR, No murmurs, No gallops or rubs. Pulm: Good air movement bilaterally. No rales, wheezing, rhonchi or rubs. Abd: Soft, nondistended, nontender, no rebound pain, no organomegaly, BS present. Ext: No pitting leg edema bilaterally. 2+DP/PT pulse bilaterally. Musculoskeletal: No joint deformities, No joint redness or warmth, no limitation of  ROM in spin. Skin: No rashes.  Neuro: Alert, oriented X3, cranial nerves II-XII grossly intact, muscle strength 5/5 in all extremities, sensation to light touch intact. Brachial reflex 2+ bilaterally. Knee reflex 1+ bilaterally. Negative Babinski's sign. Normal finger to nose test. Psych: Patient is not psychotic, no suicidal or hemocidal ideation.  Labs on Admission:  Basic Metabolic Panel:  Recent Labs Lab 03/31/15 1053 04/05/15 2150  NA 143 142  K 4.2 3.5  CL 105 105  CO2 27 27  GLUCOSE 101* 119*  BUN 21 22*  CREATININE 1.02 1.02  CALCIUM 9.6 9.1   Liver Function Tests:  Recent Labs Lab 03/31/15 1053 04/05/15 2150  AST 22 31  ALT 11 16*  ALKPHOS 74 76  BILITOT 0.9 1.1  PROT 6.5 6.8  ALBUMIN 4.0 3.8   No results for input(s): LIPASE, AMYLASE in the last 168 hours. No results for input(s): AMMONIA in the last 168 hours. CBC:  Recent Labs Lab 03/31/15 1053 04/05/15 2150 04/06/15 0350  WBC 8.6 7.8 7.5  NEUTROABS 6.0 5.4  --   HGB 14.8 14.2 13.6  HCT 44.5 43.8 42.3  MCV 96.0 97.8 96.8  PLT 235.0 227 207   Cardiac Enzymes: No results for input(s):  CKTOTAL, CKMB, CKMBINDEX, TROPONINI in the last 168 hours.  BNP (last 3 results) No results for input(s): BNP in the last 8760 hours.  ProBNP (last 3 results) No results for input(s): PROBNP in the last 8760 hours.  CBG: No results for input(s): GLUCAP in the last 168 hours.  Radiological Exams on Admission: Ct Head Wo Contrast  04/05/2015  CLINICAL DATA:  Fall at home 2 days ago.  Intermittent confusion. EXAM: CT HEAD WITHOUT CONTRAST TECHNIQUE: Contiguous axial images were obtained from the base of the skull through the vertex without intravenous contrast. COMPARISON:  CT head without contrast 05/28/2013 FINDINGS: Progressive moderate periventricular and subcortical white matter hypoattenuation is noted bilaterally. Mild to moderate generalized atrophy is present. A lacunar infarct in the right caudate is new since prior exam, but appears acute. No acute hemorrhage or mass lesion is present. There is no significant extra-axial fluid collection. The ventricles are proportionate to the degree of atrophy. The paranasal sinuses and mastoid air cells are clear. The calvarium is intact. No significant extracranial soft tissue lesion is present. IMPRESSION: 1. No acute intracranial abnormality or significant interval change. 2. Progressive white matter disease. 3. Lacunar infarct of the right basal ganglia was not present on the prior exam but appears remote. Electronically Signed   By: San Morelle M.D.   On: 04/05/2015 15:18   Mr Jeri Cos [id number] Contrast  04/06/2015  CLINICAL DATA:  Altered mental status and worsening confusion. Possible stroke. History of hypertension, hyperlipidemia, melanoma. EXAM: MRI HEAD WITHOUT AND WITH CONTRAST TECHNIQUE: Multiplanar, multiecho pulse sequences of the brain and surrounding structures were obtained without and with intravenous contrast. CONTRAST:  20 cc MultiHance COMPARISON:  CT head April 05, 2015 and CT head May 28, 2013 and December 06, 2012 FINDINGS:  Multiple sequences are motion degraded. The ventricles and sulci are normal for patient's age. No abnormal parenchymal signal, mass effect. Lentiform homogeneously T2 bright 14 x 11 mm LEFT occipital lesion without enhancement is stable from 2014. Patchy to confluent supratentorial white matter FLAIR T2 hyperintensities. No abnormal parenchymal enhancement, though coronal post gadolinium sequences severely motion degraded. No reduced diffusion to suggest acute ischemia. No susceptibility artifact to suggest hemorrhage. Old RIGHT caudate body lacunar infarct. Additional bilateral basal the  ganglia prominent perivascular spaces. No abnormal extra-axial fluid collections. No extra-axial masses nor leptomeningeal enhancement. Normal major intracranial vascular flow voids seen at the skull base. Status post bilateral ocular lens implants. No suspicious calvarial bone marrow signal. No abnormal sellar expansion. Craniocervical junction maintained. Trace paranasal sinus mucosal thickening without air-fluid levels. The mastoid air cells are well aerated. IMPRESSION: No acute intracranial process on this motion degraded examination, specifically no acute ischemia. 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 Involutional changes. Moderate to severe chronic small vessel ischemic disease. Old RIGHT basal ganglia lacunar infarct. Electronically Signed   By: Elon Alas M.D.   On: 04/06/2015 02:30    Assessment/Plan Principal Problem:   Acute encephalopathy Active Problems:   Malignant melanoma of skin of upper limb, including shoulder ([facility])   Hypothyroidism   B12 deficiency   Dyslipidemia   Essential hypertension   Coronary atherosclerosis   GERD   CORONARY ARTERY BYPASS GRAFT, HX OF  Acute encephalopathy: Etiology is not clear. Neurology was consulted, Dr. Silverio Decamp evaluated patient, recommended MRI which was done and EEG in AM. 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  -will admit to tele bed  -[facility] consultation, will f/u further recommendations. -EEG -Frequent neuro check  Hypothyroidism: Last TSH was 2.31 on 03/31/15 -Continue home Synthroid  GERD: -Protonix  B12 deficiency: -continue cyanocobalamin  CAD and hx of CABG: Denies chest pain, but patient is confused -Follow-up troponin 3 -Aspirin, metoprolol  HLD: Last LDL was 158 on 03/30/13. Patient is allergic to Ezetimibe-simvastatin. Currently not on medications. -Defer to PCP for HLD management  HTN: -Metoprolol -IV hydralazine when necessary  DVT ppx: SQ Lovenox  Code Status: DNR Family Communication: Yes, patient's daughter at bed side Disposition Plan: Admit to inpatient   Date of Service 04/06/2015    Ivor Costa [facility] Pager [phone]  If 7PM-7AM, please contact night-coverage [email or web address] Password [email or web address] 04/06/2015, 4:19 AM

## 2015-04-06 NOTE — ED Notes (Signed)
Patient transported to MRI 

## 2015-04-06 NOTE — Evaluation (Signed)
Physical Therapy Evaluation Patient Details Name: Barry Molina MRN: [id number] DOB: August 13, 1925 Today's Date: 04/06/2015   History of Present Illness  82 y.o. male with PMH of hypertension, spine surgery, bilateral shoulder surgery, right ankle surgery, hyperlipidemia, GERD, hypothyroidism, CAD, S/P of CABG, B12 deficiency, skin melanoma, who presented with altered mental status.  Clinical Impression  Pt admitted with above diagnosis. Pt currently with functional limitations due to the deficits listed below (see PT Problem List).  Pt will benefit from skilled PT to increase their independence and safety with mobility to allow discharge to the venue listed below. The patient describes living alone with family checking on him occasionally. At this time the patient is requiring moderate assistance with his ambulation and is demonstrating poor safety awareness and significant instability. Based upon the patient's current mobility level, recommending short stay SNF for further rehabilitation before returning home.      Follow Up Recommendations SNF;Supervision/Assistance - 24 hour    Equipment Recommendations  Other (comment) (to be determined at next venue)    Recommendations for Other Services       Precautions / Restrictions Precautions Precautions: Fall Restrictions Weight Bearing Restrictions: No      Mobility  Bed Mobility Overal bed mobility: Needs Assistance Bed Mobility: Supine to Sit     Supine to sit: Supervision     General bed mobility comments: using rail to assist, HOB elevated  Transfers Overall transfer level: Needs assistance   Transfers: Sit to/from Stand Sit to Stand: Min assist         General transfer comment: multiple attempts needed by patient to achieve standing (X5). Gradually increasing momentum to for stand. Patient leaning against bed for support once standing.   Ambulation/Gait Ambulation/Gait assistance: Mod assist Ambulation Distance  (Feet): 15 Feet Assistive device: 1 person hand held assist Gait Pattern/deviations: Step-through pattern;Decreased step length - right;Decreased step length - left Gait velocity: decreased   General Gait Details: Significant instability noted with gait pattern, requiring external support for safe mobility  Stairs            Wheelchair Mobility    Modified Rankin (Stroke Patients Only)       Balance Overall balance assessment: Needs assistance Sitting-balance support: No upper extremity supported Sitting balance-Leahy Scale: Fair     Standing balance support: Single extremity supported Standing balance-Leahy Scale: Poor Standing balance comment: needing physical assist to maintain static balance.                              Pertinent Vitals/Pain Pain Assessment: Faces Faces Pain Scale: Hurts a little bit Pain Location: Lt shoulder and feet.  Pain Descriptors / Indicators: Tingling Pain Intervention(s): Monitored during session    Home Living Family/patient expects to be discharged to:: Unsure Living Arrangements: Alone Available Help at Discharge: Family Type of Home: House Home Access: Stairs to enter Entrance Stairs-Rails:  (unclear description by patient) Technical brewer of Steps: 3-4 Home Layout: One level Home Equipment: Cane - single point Additional Comments: has daughters that live near, however unsure if they could be with pt 24/7    Prior Function Level of Independence: Independent with assistive device(s)         Comments: reports he goes to restaurant to eat everyday     Hand Dominance        Extremity/Trunk Assessment   Upper Extremity Assessment: LUE deficits/detail;RUE deficits/detail RUE Deficits / Details: generalized weakness in Rt  shoulder flexors     LUE Deficits / Details: pain in LUE with shoulder flexion; pt with limited ROM in left shoulder; history of bilatearl shoulder surgery   Lower Extremity  Assessment: Generalized weakness         Communication   Communication: HOH  Cognition Arousal/Alertness: Awake/alert Behavior During Therapy: Flat affect;WFL for tasks assessed/performed Overall Cognitive Status: No family/caregiver present to determine baseline cognitive functioning (poor safety awareness.)                      General Comments      Exercises        Assessment/Plan    PT Assessment Patient needs continued PT services  PT Diagnosis Difficulty walking;Generalized weakness   PT Problem List Decreased strength;Decreased activity tolerance;Decreased balance;Decreased mobility;Decreased safety awareness  PT Treatment Interventions DME instruction;Gait training;Stair training;Functional mobility training;Therapeutic activities;Therapeutic exercise;Patient/family education   PT Goals (Current goals can be found in the Care Plan section) Acute Rehab PT Goals Patient Stated Goal: not stated PT Goal Formulation: With patient (Plan to progress mobility) Time For Goal Achievement: 04/20/15 Potential to Achieve Goals: Good    Frequency Min 3X/week   Barriers to discharge Decreased caregiver support Recommending 24 hour supervision based upon current mobility.     Co-evaluation   Reason for Co-Treatment: For patient/therapist safety PT goals addressed during session: Mobility/safety with mobility OT goals addressed during session: Other (comment);ADL's and self-care (mobility)       End of Session Equipment Utilized During Treatment: Gait belt Activity Tolerance: Patient tolerated treatment well Patient left: in chair;with call bell/phone within reach;with chair alarm set Nurse Communication: Mobility status    Functional Assessment Tool Used: clinical judgment Functional Limitation: Mobility: Walking and moving around Mobility: Walking and Moving Around Current Status [id number]): At least 40 percent but less than 60 percent impaired, limited or  restricted Mobility: Walking and Moving Around Goal Status [phone]): At least 1 percent but less than 20 percent impaired, limited or restricted    Time: 0840-0906 PT Time Calculation (min) (ACUTE ONLY): 26 min   Charges:   PT Evaluation $PT Eval Moderate Complexity: 1 Procedure     PT G Codes:   PT G-Codes **NOT FOR INPATIENT CLASS** Functional Assessment Tool Used: clinical judgment Functional Limitation: Mobility: Walking and moving around Mobility: Walking and Moving Around Current Status [id number]): At least 40 percent but less than 60 percent impaired, limited or restricted Mobility: Walking and Moving Around Goal Status [phone]): At least 1 percent but less than 20 percent impaired, limited or restricted    Cassell Clement, PT, [place] Pager [phone] Office [phone]  04/06/2015, 11:42 AM

## 2015-04-06 NOTE — ED Notes (Signed)
Attempted report x1. 

## 2015-04-06 NOTE — ED Provider Notes (Signed)
Patient transferred to emergency department from [facility] for MRI. Patient seen with acute mental status changes. CT scan concerning for possible stroke. Patient transferred to the ER and had MRI performed. There is no evidence of acute stroke noted. Patient evaluated by neurology. Neurology recommended medicine admission and EEG in the morning.  Orpah Greek, MD 04/06/15 [phone]

## 2015-04-06 NOTE — Progress Notes (Signed)
Patient Demographics:    Barry Molina, is a 82 y.o. male, DOB - [birth year], [id number]  Admit date - 04/05/2015   Admitting Physician Ivor Costa, MD  Outpatient Primary MD for the patient is Walker Kehr, MD  LOS - 0   Chief Complaint  Patient presents with  . Altered Mental Status        Subjective:    Barry Molina today has, No headache, No chest pain, No abdominal pain - No Nausea, No new weakness tingling or numbness, No Cough - SOB.     Assessment  & Plan :     1. Delirium. Likely undiagnosed early dementia. Mentation is much clearer this morning, CT head and MRI brain nonacute with a chronic left occipital cyst, EEG nonspecific. No signs of UTI or infection. Continue supportive care. Will be evaluated by PT, speech. Likely discharge in the morning if stable.   2. Hypothyroidism. Elevated TSH at 8.7. We will increase Synthroid dose.   3. CAD. Continue aspirin, beta blocker for secondary prevention. No acute issues.  4. GERD. On PPI continue.  5. History of vitamin B-12 deficiency. Levels stable. Continue home dose oral supplementation.    Code Status : Full  Family Communication  : None  Disposition Plan  : TBD  Consults  :  Neurology  Procedures  :   CT head MRI brain confirmed left occipital cyst which appears to be chronic EEG. Unremarkable  DVT Prophylaxis  :  Lovenox    Lab Results  Component Value Date   PLT 207 04/06/2015    Inpatient Medications  Scheduled Meds: . aspirin  325 mg Oral Daily  . cholecalciferol  1,000 Units Oral Daily  . enoxaparin (LOVENOX) injection  40 mg Subcutaneous Q24H  . fluticasone  1 spray Each Nare Daily  . levothyroxine  112 mcg Oral QAC breakfast  . metoprolol succinate  12.5 mg Oral Daily  . pantoprazole  40 mg Oral Daily   . sodium chloride flush  3 mL Intravenous Q12H  . cyanocobalamin  1,000 mcg Oral Daily   Continuous Infusions:  PRN Meds:.acetaminophen **OR** acetaminophen, hydrALAZINE, ondansetron **OR** ondansetron (ZOFRAN) IV, traMADol  Antibiotics  :      Anti-infectives    None        Objective:   Filed Vitals:   04/06/15 0345 04/06/15 0400 04/06/15 0415 04/06/15 0448  BP: 146/82 153/65 159/58 151/84  Pulse: 76 68 70 68  Temp:    98.3 F (36.8 C)  TempSrc:    Oral  Resp:    18  Height:    5\' 11"  (1.803 m)  Weight:    92.5 kg (203 lb 14.8 oz)  SpO2: 93% 93%  97%    Wt Readings from Last 3 Encounters:  04/06/15 92.5 kg (203 lb 14.8 oz)  03/31/15 94.348 kg (208 lb)  03/25/15 93.328 kg (205 lb 12 oz)     Intake/Output Summary (Last 24 hours) at 04/06/15 1332 Last data filed at 04/06/15 1217  Gross per 24 hour  Intake      0 ml  Output    200 ml  Net   -200 ml     Physical Exam  Awake Alert, Oriented X 2, No new F.N deficits,  Normal affect [place].AT,PERRAL Supple Neck,No JVD, No cervical lymphadenopathy appriciated.  Symmetrical Chest wall movement, Good air movement bilaterally, CTAB RRR,No Gallops,Rubs or new Murmurs, No Parasternal Heave +ve B.Sounds, Abd Soft, No tenderness, No organomegaly appriciated, No rebound - guarding or rigidity. No Cyanosis, Clubbing or edema, No new Rash or bruise     Data Review:   Micro Results No results found for this or any previous visit (from the past 240 hour(s)).  Radiology Reports Ct Head Wo Contrast  04/05/2015  CLINICAL DATA:  Fall at home 2 days ago.  Intermittent confusion. EXAM: CT HEAD WITHOUT CONTRAST TECHNIQUE: Contiguous axial images were obtained from the base of the skull through the vertex without intravenous contrast. COMPARISON:  CT head without contrast 05/28/2013 FINDINGS: Progressive moderate periventricular and subcortical white matter hypoattenuation is noted bilaterally. Mild to moderate generalized atrophy is  present. A lacunar infarct in the right caudate is new since prior exam, but appears acute. No acute hemorrhage or mass lesion is present. There is no significant extra-axial fluid collection. The ventricles are proportionate to the degree of atrophy. The paranasal sinuses and mastoid air cells are clear. The calvarium is intact. No significant extracranial soft tissue lesion is present. IMPRESSION: 1. No acute intracranial abnormality or significant interval change. 2. Progressive white matter disease. 3. Lacunar infarct of the right basal ganglia was not present on the prior exam but appears remote. Electronically Signed   By: San Morelle M.D.   On: 04/05/2015 15:18   Mr Jeri Cos [id number] Contrast  04/06/2015  CLINICAL DATA:  Altered mental status and worsening confusion. Possible stroke. History of hypertension, hyperlipidemia, melanoma. EXAM: MRI HEAD WITHOUT AND WITH CONTRAST TECHNIQUE: Multiplanar, multiecho pulse sequences of the brain and surrounding structures were obtained without and with intravenous contrast. CONTRAST:  20 cc MultiHance COMPARISON:  CT head April 05, 2015 and CT head May 28, 2013 and December 06, 2012 FINDINGS: Multiple sequences are motion degraded. The ventricles and sulci are normal for patient's age. No abnormal parenchymal signal, mass effect. Lentiform homogeneously T2 bright 14 x 11 mm LEFT occipital lesion without enhancement is stable from 2014. Patchy to confluent supratentorial white matter FLAIR T2 hyperintensities. No abnormal parenchymal enhancement, though coronal post gadolinium sequences severely motion degraded. No reduced diffusion to suggest acute ischemia. No susceptibility artifact to suggest hemorrhage. Old RIGHT caudate body lacunar infarct. Additional bilateral basal the ganglia prominent perivascular spaces. No abnormal extra-axial fluid collections. No extra-axial masses nor leptomeningeal enhancement. Normal major intracranial vascular flow voids seen at  the skull base. Status post bilateral ocular lens implants. No suspicious calvarial bone marrow signal. No abnormal sellar expansion. Craniocervical junction maintained. Trace paranasal sinus mucosal thickening without air-fluid levels. The mastoid air cells are well aerated. IMPRESSION: No acute intracranial process on this motion degraded examination, specifically no acute ischemia. 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 Involutional changes. Moderate to severe chronic small vessel ischemic disease. Old RIGHT basal ganglia lacunar infarct. Electronically Signed   By: Elon Alas M.D.   On: 04/06/2015 02:30   Dg Chest Port 1 View  04/06/2015  CLINICAL DATA:  82 year old who presented this admission with acute mental status changes. Prior CABG. Current history of coronary artery disease and vitamin B12 deficiency. EXAM: PORTABLE CHEST 1 VIEW COMPARISON:  11/01/2010, 09/23/2010. FINDINGS: Prior sternotomy for CABG. Cardiac silhouette upper normal in size to slightly enlarged, unchanged. Thoracic aorta atherosclerotic, unchanged. Hilar and mediastinal contours  otherwise unremarkable. Chronic elevation of the left hemidiaphragm with chronic scar/atelectasis at the left lung base. Lungs otherwise clear. Emphysematous changes in the upper lobes as noted previously. No localized airspace consolidation. No pleural effusions. No pneumothorax. Normal pulmonary vascularity. IMPRESSION: COPD/emphysema. Chronic elevation the left hemidiaphragm with chronic scar/atelectasis at the left lung base. No acute cardiopulmonary disease. Electronically Signed   By: Evangeline Dakin M.D.   On: 04/06/2015 09:15     CBC  Recent Labs Lab 03/31/15 1053 04/05/15 2150 04/06/15 0350  WBC 8.6 7.8 7.5  HGB 14.8 14.2 13.6  HCT 44.5 43.8 42.3  PLT 235.0 227 207  MCV 96.0 97.8 96.8  MCH  --  31.7 31.1  MCHC 33.3 32.4 32.2  RDW 14.7 14.1 14.1    LYMPHSABS 1.9 1.5  --   MONOABS 0.6 0.8  --   EOSABS 0.1 0.1  --   BASOSABS 0.0 0.0  --     Chemistries   Recent Labs Lab 03/31/15 1053 04/05/15 2150 04/06/15 0350  NA 143 142 140  K 4.2 3.5 3.4*  CL 105 105 104  CO2 27 27 24   GLUCOSE 101* 119* 103*  BUN 21 22* 18  CREATININE 1.02 1.02 1.02  CALCIUM 9.6 9.1 9.1  AST 22 31  --   ALT 11 16*  --   ALKPHOS 74 76  --   BILITOT 0.9 1.1  --    ------------------------------------------------------------------------------------------------------------------ No results for input(s): CHOL, HDL, LDLCALC, TRIG, CHOLHDL, LDLDIRECT in the last 72 hours.  No results found for: HGBA1C ------------------------------------------------------------------------------------------------------------------  Recent Labs  04/06/15 0951  TSH 8.041*   ------------------------------------------------------------------------------------------------------------------  Recent Labs  04/06/15 0951  VITAMINB12 975*    Coagulation profile  Recent Labs Lab 04/06/15 0350  INR 1.17    No results for input(s): DDIMER in the last 72 hours.  Cardiac Enzymes  Recent Labs Lab 04/06/15 0350 04/06/15 0930  TROPONINI 0.04* 0.04*   ------------------------------------------------------------------------------------------------------------------ No results found for: BNP  Time Spent in minutes  35   SINGH,PRASHANT K M.D on 04/06/2015 at 1:32 PM  Between 7am to 7pm - Pager - [phone]  After 7pm go to [email or web address] - password [facility]  [facility] -  Office  [phone]

## 2015-04-06 NOTE — Procedures (Signed)
ELECTROENCEPHALOGRAM REPORT   Patient: Barry Molina      Room #: [id number] Age: 82 y.o.        Sex: male Referring Physician: Dr Candiss Norse Report Date:  04/06/2015        Interpreting Physician: Hulen Luster  History: Barry Molina is an 82 y.o. male admitted with altered mental status  Medications:  I have reviewed the patient's current medications.  Conditions of Recording:  This is a 16 channel EEG carried out with the patient in the drowsy state.  Description:  The waking background activity consists predominantly of a low voltage, symmetrical, poorly organized, theta activity, seen from the parieto-occipital and posterior temporal regions.  Brief non-sustained posterior alpha activity is noted. No focal slowing or epileptiform activity is noted.   Hyperventilation was not performed. Intermittent photic stimulation was not performed.  IMPRESSION: Abnormal EEG due to the presence of generalized slowing indicating a mild to moderate cerebral disturbance (encephalopathy). No epileptiform activity noted.    Jim Like, DO [facility] [phone]  If 7pm- 7am, please page neurology on call as listed in AMION. 04/06/2015, 11:10 AM

## 2015-04-06 NOTE — Progress Notes (Signed)
Pt arrived to room 5C19 from ED.  Pt is alert and oriented with no complaints. Family at bedside but went home.  Safety measures in place.  Will continue to monitor.   Fredrich Romans, RN

## 2015-04-06 NOTE — Evaluation (Signed)
Occupational Therapy Evaluation Patient Details Name: Barry Molina MRN: [id number] DOB: March 14, 1925 Today's Date: 04/06/2015    History of Present Illness 82 y.o. male with PMH of hypertension,bilateral shoulder surgery, right ankle surgery, hyperlipidemia, GERD, hypothyroidism, CAD, S/P of CABG, B12 deficiency, skin melanoma, who presented with altered mental status.   Clinical Impression   Pt admitted with above. Pt reports he was independent with ADLs, PTA. Feel pt will benefit from acute OT to increase independence prior to d/c. Recommending SNF at this time and 24/7 assist. Recommending 24/7 assist/supervision if pt goes home.    Follow Up Recommendations  SNF;Supervision/Assistance - 24 hour    Equipment Recommendations  Other (comment) (defer to next venue)    Recommendations for Other Services       Precautions / Restrictions Precautions Precautions: Fall Restrictions Weight Bearing Restrictions: No      Mobility Bed Mobility Overal bed mobility: Needs Assistance Bed Mobility: Supine to Sit     Supine to sit: Supervision       Transfers Overall transfer level: Needs assistance   Transfers: Sit to/from Stand Sit to Stand: Min assist              Balance    Mod assist for ambulation (hand held assist)                                        ADL Overall ADL's : Needs assistance/impaired                     Lower Body Dressing: Sit to/from stand;Moderate assistance    Toilet Transfer: Moderate assistance;Ambulation (Min assist for sit to stand and Mod assist for ambulation)           Functional mobility during ADLs: Moderate assistance    Comments: OT assisted pt in donning socks.     Vision     Perception     Praxis      Pertinent Vitals/Pain Pain Assessment: Faces Faces Pain Scale: Hurts little more Pain Location: LUE and feet Pain Descriptors / Indicators: Tingling Pain Intervention(s): Monitored  during session     Hand Dominance     Extremity/Trunk Assessment Upper Extremity Assessment Upper Extremity Assessment: LUE deficits/detail;RUE deficits/detail RUE Deficits / Details: generalized weakness in Rt shoulder flexors LUE Deficits / Details: pain in LUE with shoulder flexion; pt with limited ROM in left shoulder; history of bilatearl shoulder surgery   Lower Extremity Assessment Lower Extremity Assessment: Defer to PT evaluation       Communication Communication Communication: HOH   Cognition Arousal/Alertness: Awake/alert Behavior During Therapy: Flat affect;WFL for tasks assessed/performed Overall Cognitive Status: No family/caregiver present to determine baseline cognitive functioning                     General Comments       Exercises       Shoulder Instructions      Home Living Family/patient expects to be discharged to:: Unsure Living Arrangements: Alone Available Help at Discharge: Family Type of Home: House Home Access: Stairs to enter Technical brewer of Steps: 3-4 Entrance Stairs-Rails:  (reports one in back and front?) Home Layout: One level     Bathroom Shower/Tub: Walk-in shower         Home Equipment: Kasandra Knudsen - single point (has shower seat-unsure if it is built in or seperate)  Additional Comments: has daughters that live near, however unsure if they could be with pt 24/7      Prior Functioning/Environment Level of Independence: Independent with assistive device(s)        Comments: reports he goes to restaurant to eat everyday    OT Diagnosis: Generalized weakness   OT Problem List: Decreased cognition;Decreased knowledge of use of DME or AE;Decreased knowledge of precautions;Pain;Impaired UE functional use;Decreased range of motion;Decreased strength;Impaired balance (sitting and/or standing)   OT Treatment/Interventions: Self-care/ADL training;DME and/or AE instruction;Therapeutic activities;Patient/family  education;Balance training;Cognitive remediation/compensation;Therapeutic exercise    OT Goals(Current goals can be found in the care plan section) Acute Rehab OT Goals Patient Stated Goal: not stated OT Goal Formulation: With patient Time For Goal Achievement: 04/13/15 Potential to Achieve Goals: Good ADL Goals Pt Will Perform Lower Body Bathing: sit to/from stand;with min guard assist (with or without AE) Pt Will Perform Lower Body Dressing: with min guard assist;sit to/from stand (with or without AE) Pt Will Transfer to Toilet: with min guard assist;ambulating;bedside commode Pt Will Perform Toileting - Clothing Manipulation and hygiene: with min guard assist;sit to/from stand  OT Frequency: Min 2X/week   Barriers to D/C:            Co-evaluation PT/OT/SLP Co-Evaluation/Treatment: Yes Reason for Co-Treatment: For patient/therapist safety   OT goals addressed during session: Other (comment);ADL's and self-care (mobility)      End of Session Equipment Utilized During Treatment: Gait belt Nurse Communication: Other (comment) (chair alarm under pt)  Activity Tolerance: Patient tolerated treatment well Patient left: in chair;with call bell/phone within reach;with chair alarm set   Time: [phone] OT Time Calculation (min): 17 min Charges:  OT General Charges $OT Visit: 1 Procedure OT Evaluation $OT Eval Moderate Complexity: 1 Procedure G-Codes: OT G-codes **NOT FOR INPATIENT CLASS** Functional Assessment Tool Used: clinical judgment Functional Limitation: Self care Self Care Current Status [id number]): At least 40 percent but less than 60 percent impaired, limited or restricted Self Care Goal Status [id number]): At least 1 percent but less than 20 percent impaired, limited or restricted  Benito Mccreedy OTR/L [id number] 04/06/2015, 10:40 AM

## 2015-04-07 DIAGNOSIS — G9340 Encephalopathy, unspecified: Secondary | ICD-10-CM | POA: Diagnosis not present

## 2015-04-07 LAB — BASIC METABOLIC PANEL
ANION GAP: 10 (ref 5–15)
BUN: 14 mg/dL (ref 6–20)
CHLORIDE: 106 mmol/L (ref 101–111)
CO2: 26 mmol/L (ref 22–32)
Calcium: 8.9 mg/dL (ref 8.9–10.3)
Creatinine, Ser: 1.02 mg/dL (ref 0.61–1.24)
GFR calc Af Amer: >60 mL/min (ref >60)
GFR calc non Af Amer: >60 mL/min (ref >60)
GLUCOSE: 89 mg/dL (ref 65–99)
POTASSIUM: 3.4 mmol/L — AB (ref 3.5–5.1)
Sodium: 142 mmol/L (ref 135–145)

## 2015-04-07 LAB — CBC
HEMATOCRIT: 42.5 % (ref 39.0–52.0)
HEMOGLOBIN: 13.6 g/dL (ref 13.0–17.0)
MCH: 30.8 pg (ref 26.0–34.0)
MCHC: 32 g/dL (ref 30.0–36.0)
MCV: 96.2 fL (ref 78.0–100.0)
Platelets: 217 10*3/uL (ref 150–400)
RBC: 4.42 MIL/uL (ref 4.22–5.81)
RDW: 14.2 % (ref 11.5–15.5)
WBC: 7.3 10*3/uL (ref 4.0–10.5)

## 2015-04-07 LAB — MAGNESIUM: Magnesium: 2.1 mg/dL (ref 1.7–2.4)

## 2015-04-07 LAB — GLUCOSE, CAPILLARY: GLUCOSE-CAPILLARY: 148 mg/dL — AB (ref 65–99)

## 2015-04-07 MED ORDER — POTASSIUM CHLORIDE CRYS ER 20 MEQ PO TBCR
40.0000 meq | EXTENDED_RELEASE_TABLET | Freq: Once | ORAL | Status: AC
  Administered 2015-04-07: 40 meq via ORAL
  Filled 2015-04-07: qty 2

## 2015-04-07 MED ORDER — LEVOTHYROXINE SODIUM 125 MCG PO TABS: 125.0000 ug | ORAL_TABLET | Freq: Every day | ORAL | Status: DC

## 2015-04-07 NOTE — Care Management Note (Addendum)
Case Management Note  Patient Details  Name: Barry Molina MRN: [id number] Date of Birth: May 01, 1925  Subjective/Objective:                    Action/Plan: Plan is for patient to discharge home with home health services. CM met with the patient and his daughter and provided them a list of [place] agencies in the [place] area. They selected [facility]. CM left a message for [facility] with [facility]. CM will follow up with the referral. Bedside RN updated.   Addendum: (1548) Mary with [facility] returned call and she has accepted the referral.  Expected Discharge Date:                  Expected Discharge Plan:  [place]  In-House Referral:     Discharge planning Services  CM Consult  Post Acute Care Choice:    Choice offered to:  Patient, Adult Children  DME Arranged:    DME Agency:     HH Arranged:  RN, PT, Nurse's Aide, Social Work [employer] Agency:  [facility]  Status of Service:  Completed, signed off  Medicare Important Message Given:    Date Medicare IM Given:    Medicare IM give by:    Date Additional Medicare IM Given:    Additional Medicare Important Message give by:     If discussed at [place] of Stay Meetings, dates discussed:    Additional Comments:  Pollie Friar, RN 04/07/2015, 2:30 PM

## 2015-04-07 NOTE — Progress Notes (Signed)
Physical Therapy Treatment Patient Details Name: Barry Molina MRN: [id number] DOB: [birth year] Today's Date: 04/07/2015    History of Present Illness 82 y.o. male with PMH of hypertension, spine surgery, bilateral shoulder surgery, right ankle surgery, hyperlipidemia, GERD, hypothyroidism, CAD, S/P of CABG, B12 deficiency, skin melanoma, who presented with altered mental status.    PT Comments    Pt making good progress towards all acute care goals. Pt requiring Min Guard for all transfers and mobility. Min A needed for safe stair navigation with cueing due to preexisting knee and shoulder pain. Pt demonstrated safe mobility with RW. Will benefit from continued acute PT services. Recommending HHPT if PT can be assured that daughters/family can provide 24/7 supervision/assistance for initial return home otherwise pt would need ST-SNF to achieve Mod I for safe transition home.   Follow Up Recommendations  Home health PT;Supervision/Assistance - 24 hour     Equipment Recommendations  None recommended by PT    Recommendations for Other Services       Precautions / Restrictions Precautions Precautions: Fall Restrictions Weight Bearing Restrictions: No    Mobility  Bed Mobility Overal bed mobility: Modified Independent Bed Mobility: Sit to Supine       Sit to supine: Modified independent (Device/Increase time)      Transfers Overall transfer level: Needs assistance Equipment used: Rolling walker (2 wheeled) Transfers: Sit to/from Stand Sit to Stand: Min guard         General transfer comment: stood on 1st attempt, slow to come to full standing, stable upon inital standing  Ambulation/Gait Ambulation/Gait assistance: Min guard Ambulation Distance (Feet): 200 Feet Assistive device: Rolling walker (2 wheeled) Gait Pattern/deviations: Step-through pattern;Decreased stride length Gait velocity: decreased Gait velocity interpretation: Below normal speed for  age/gender General Gait Details: Stable with RW, no LOB navigating turns, stair wells or room   Stairs Stairs: Yes Stairs assistance: Min assist Stair Management: Two rails;Step to pattern Number of Stairs: 4 General stair comments: cues for sequencing due to R knee pain  Wheelchair Mobility    Modified Rankin (Stroke Patients Only)       Balance Overall balance assessment: Needs assistance Sitting-balance support: No upper extremity supported;Feet supported Sitting balance-Leahy Scale: Fair     Standing balance support: Bilateral upper extremity supported Standing balance-Leahy Scale: Fair Standing balance comment: RW for support                    Cognition Arousal/Alertness: Awake/alert Behavior During Therapy: WFL for tasks assessed/performed Overall Cognitive Status: Within Functional Limits for tasks assessed                      Exercises      General Comments        Pertinent Vitals/Pain Pain Assessment: Faces Faces Pain Scale: Hurts little more Pain Location: Lt shoulder, R knee Pain Descriptors / Indicators: Aching Pain Intervention(s): Monitored during session    Home Living                      Prior Function            PT Goals (current goals can now be found in the care plan section) Acute Rehab PT Goals Patient Stated Goal: get home today to get real food Progress towards PT goals: Progressing toward goals    Frequency  Min 3X/week    PT Plan Current plan remains appropriate;Discharge plan needs to be updated  Co-evaluation             End of Session Equipment Utilized During Treatment: Gait belt Activity Tolerance: Patient tolerated treatment well Patient left: in bed;with call bell/phone within reach     Time: 0848-0908 PT Time Calculation (min) (ACUTE ONLY): 20 min  Charges:  $Gait Training: 8-22 mins                    G Codes:      Ara Kussmaul April 22, 2015, 10:43 AM  Ara Kussmaul,  Student Physical Therapist Acute Rehab [phone]

## 2015-04-07 NOTE — Discharge Summary (Signed)
Barry Molina, is a 82 y.o. male  DOB 23-Jun-1925  MRN [id number].  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 B12 DEFICIENCY 12/09/2007  . COLONIC POLYPS, HX OF 12/14/2006  . CORONARY ARTERY BYPASS GRAFT, HX OF 08/26/2008  . CORONARY ARTERY DISEASE 12/14/2006  . Edema 08/27/2008  . ELBOW PAIN 10/06/2009  . GERD 12/14/2006  . HYPERLIPIDEMIA 12/14/2006  . HYPERTENSION 12/14/2006  . HYPOTHYROIDISM 12/14/2006  . LEG PAIN 05/10/2010  . MUSCLE PAIN 04/10/2007  . NEOPLASM, SKIN, UNCERTAIN BEHAVIOR [id number]  . Other specified acquired hypothyroidism 04/08/2007  . Personal history of other diseases of digestive disease 08/26/2008  . SHOULDER PAIN 12/19/2006  . Cataract   . 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 Right ankle surgery    . Coronary artery bypass graft    . Rotator cuff repair       bilateral  . Spine surgery  8/12    LS spine cyst  . Back surgery  09/27/10  . Cholecystectomy  9/12       HPI  from the history and physical done on the day of admission:     Barry Molina is a 82 y.o. male with PMH of hypertension, hyperlipidemia, GERD, hypothyroidism, CAD, S/P of CABG, B12 deficiency, skin melanoma, who presents with altered mental status.  Per patient's daughter, he lives alone and his daughter lives close by half a mile away, frequent checks on him. Daughter noticed that patient has become confused for at least 2 days. Patient has generalized weakness, but no unilateral weakness, or numbness. Patient has chronic bilateral knee problems, which has not changed. Patient does not have chest pain, shortness breath, abdominal pain, diarrhea or symptoms of UTI. His PCP ordered a CT head on Monday, which showed possible stroke. Patient was brought to [facility] ER for further evaluation and treatment. A CT of the head was done in the [facility] ER, which showed chronic microvessel changes might matter, right basal ganglia lacunar infarct and also a focal hypodensity  in the left posterior parietal subcortical area concerning for a small chronic infarct. Therefore pt was transferred to [facility] for neuro evaluation.  In ED, patient was found to have negative urinalysis, WBC 7.8, temperature normal, electrolytes and renal function okay. 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 Patient is admitted to inpatient for further evaluate and treatment.  EKG: Independently reviewed. QTC 497, old right bundle blockage, which existed in previous EKG on 10/30/10.    Hospital  Course:    1. Delirium. Likely undiagnosed early dementia. Mentation is much clearer this morning and he is back to baseline, ambulated with PT, oriented 3, CT head and MRI brain nonacute with a chronic left occipital cyst, EEG nonspecific. No signs of UTI or infection. Continue supportive care. Likely discharge home if family comfortable on else to SNF.   2. Hypothyroidism. Elevated TSH at 8.7. We will increase Synthroid dose. Requests PCP to recheck TSH in 4 weeks.   3. CAD. Continue aspirin, beta blocker for secondary prevention. No acute issues.  4. GERD. On PPI continue.  5. History of vitamin B-12 deficiency. Levels stable. Continue home dose oral supplementation.     Discharge Condition: Stable  Follow UP  Follow-up Information    Follow up with [facility]. Schedule an appointment as soon as possible for a visit in 1 week.   Why:  early dementia   Contact information:   [address]     [place] [phone]      Follow up with Walker Kehr, MD. Schedule an appointment as soon as possible for a visit in 3 days.   Specialty:  Internal Medicine   Contact information:   [address] [place]  [address] [phone]        Consults obtained - Neuro  Diet and Activity recommendation: See Discharge Instructions below  Discharge Instructions           Discharge Instructions    Diet - low sodium heart healthy    Complete by:  As directed      Discharge instructions    Complete by:  As directed   Follow with Primary MD Walker Kehr, MD in 3 days   Get CBC, CMP, 2 view Chest X ray checked  by Primary MD next visit.    Activity: As tolerated with Full fall precautions use walker/cane & assistance as needed   Disposition Home    Diet:   Heart Healthy  with feeding assistance and aspiration precautions.  For Heart failure patients - Check your Weight same time everyday, if you gain over 2  pounds, or you develop in leg swelling, experience more shortness of breath or chest pain, call your Primary MD immediately. Follow Cardiac Low Salt Diet and 1.5 lit/day fluid restriction.   On your next visit with your primary care physician please Get Medicines reviewed and adjusted.   Please request your Prim.MD to go over all Hospital Tests and Procedure/Radiological results at the follow up, please get all Hospital records sent to your Prim MD by signing hospital release before you go home.   If you experience worsening of your admission symptoms, develop shortness of breath, life threatening emergency, suicidal or homicidal thoughts  you must seek medical attention immediately by calling 911 or calling your MD immediately  if symptoms less severe.  You Must read complete instructions/literature along with all the possible adverse reactions/side effects for all the Medicines you take and that have been prescribed to you. Take any new Medicines after you have completely understood and accpet all the possible adverse reactions/side effects.   Do not drive, operating heavy machinery, perform activities at heights, swimming or participation in water activities or provide baby sitting services if your were admitted for syncope or siezures until you have seen by Primary MD or a Neurologist and advised to do so again.  Do not drive when taking Pain medications.    Do not take more than prescribed Pain, Sleep and Anxiety Medications  Special Instructions: If you have smoked or chewed Tobacco  in the last 2 yrs please stop smoking, stop any regular Alcohol  and or any Recreational drug use.  Wear Seat belts while driving.   Please note  You were cared for by a hospitalist during your hospital stay. If you have any questions about your discharge medications or the care you received while you were in the hospital after you are discharged, you can call the unit and asked to speak with the  hospitalist on call if the hospitalist that took care of you is not available. Once you are discharged, your primary care physician will handle any further medical issues. Please note that NO REFILLS for any discharge medications will be authorized once you are discharged, as it is imperative that you return to your primary care physician (or establish a relationship with a primary care physician if you do not have one) for your aftercare needs so that they can reassess your need for medications and monitor your lab values.     Increase activity slowly    Complete by:  As directed              Discharge Medications       Medication List    TAKE these medications        aspirin 325 MG tablet  Take 325 mg by mouth daily.     cyanocobalamin 1000 MCG tablet  Take 1,000 mcg by mouth daily.     fluticasone 50 MCG/ACT nasal spray  Commonly known as:  FLONASE  USE 1 SPRAY IN EACH NOSTRIL DAILY AS NEEDED     furosemide 20 MG tablet  Commonly known as:  LASIX  Take 1-2 tablets (20-40 mg total) by mouth daily as needed for edema.     levothyroxine 125 MCG tablet  Commonly known as:  SYNTHROID, LEVOTHROID  Take 1 tablet (125 mcg total) by mouth daily.     metoprolol succinate 25 MG 24 hr tablet  Commonly known as:  TOPROL-XL  Take 0.5 tablets (12.5 mg total) by mouth daily.     omeprazole 40 MG capsule  Commonly known as:  PRILOSEC  Take 1 capsule (40 mg total) by mouth daily.     promethazine-codeine 6.25-10 MG/5ML syrup  Commonly known as:  PHENERGAN with CODEINE  Take 5 mLs by mouth every 4 (four) hours as needed for cough.     traMADol 50 MG tablet  Commonly known as:  ULTRAM  Take 1-2 tablets (50-100 mg total) by mouth every 12 (twelve) hours as needed for severe pain.     Vitamin D3 1000 units Caps  Take 1,000 Units by mouth daily.  Major procedures and Radiology Reports - PLEASE review detailed and final reports for all details, in brief -    CT head MRI  brain confirmed left occipital cyst which appears to be chronic EEG. Unremarkable   Ct Head Wo Contrast  04/05/2015  CLINICAL DATA:  Fall at home 2 days ago.  Intermittent confusion. EXAM: CT HEAD WITHOUT CONTRAST TECHNIQUE: Contiguous axial images were obtained from the base of the skull through the vertex without intravenous contrast. COMPARISON:  CT head without contrast 05/28/2013 FINDINGS: Progressive moderate periventricular and subcortical white matter hypoattenuation is noted bilaterally. Mild to moderate generalized atrophy is present. A lacunar infarct in the right caudate is new since prior exam, but appears acute. No acute hemorrhage or mass lesion is present. There is no significant extra-axial fluid collection. The ventricles are proportionate to the degree of atrophy. The paranasal sinuses and mastoid air cells are clear. The calvarium is intact. No significant extracranial soft tissue lesion is present. IMPRESSION: 1. No acute intracranial abnormality or significant interval change. 2. Progressive white matter disease. 3. Lacunar infarct of the right basal ganglia was not present on the prior exam but appears remote. Electronically Signed   By: San Morelle M.D.   On: 04/05/2015 15:18   Mr Barry Molina [id number] Contrast  04/06/2015  CLINICAL DATA:  Altered mental status and worsening confusion. Possible stroke. History of hypertension, hyperlipidemia, melanoma. EXAM: MRI HEAD WITHOUT AND WITH CONTRAST TECHNIQUE: Multiplanar, multiecho pulse sequences of the brain and surrounding structures were obtained without and with intravenous contrast. CONTRAST:  20 cc MultiHance COMPARISON:  CT head April 05, 2015 and CT head May 28, 2013 and December 06, 2012 FINDINGS: Multiple sequences are motion degraded. The ventricles and sulci are normal for patient's age. No abnormal parenchymal signal, mass effect. Lentiform homogeneously T2 bright 14 x 11 mm LEFT occipital lesion without enhancement is stable  from 2014. Patchy to confluent supratentorial white matter FLAIR T2 hyperintensities. No abnormal parenchymal enhancement, though coronal post gadolinium sequences severely motion degraded. No reduced diffusion to suggest acute ischemia. No susceptibility artifact to suggest hemorrhage. Old RIGHT caudate body lacunar infarct. Additional bilateral basal the ganglia prominent perivascular spaces. No abnormal extra-axial fluid collections. No extra-axial masses nor leptomeningeal enhancement. Normal major intracranial vascular flow voids seen at the skull base. Status post bilateral ocular lens implants. No suspicious calvarial bone marrow signal. No abnormal sellar expansion. Craniocervical junction maintained. Trace paranasal sinus mucosal thickening without air-fluid levels. The mastoid air cells are well aerated. IMPRESSION: No acute intracranial process on this motion degraded examination, specifically no acute ischemia. 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 Involutional changes. Moderate to severe chronic small vessel ischemic disease. Old RIGHT basal ganglia lacunar infarct. Electronically Signed   By: Elon Alas M.D.   On: 04/06/2015 02:30   Dg Chest Port 1 View  04/06/2015  CLINICAL DATA:  82 year old who presented this admission with acute mental status changes. Prior CABG. Current history of coronary artery disease and vitamin B12 deficiency. EXAM: PORTABLE CHEST 1 VIEW COMPARISON:  11/01/2010, 09/23/2010. FINDINGS: Prior sternotomy for CABG. Cardiac silhouette upper normal in size to slightly enlarged, unchanged. Thoracic aorta atherosclerotic, unchanged. Hilar and mediastinal contours otherwise unremarkable. Chronic elevation of the left hemidiaphragm with chronic scar/atelectasis at the left lung base. Lungs otherwise clear. Emphysematous changes in the upper lobes as noted previously. No localized airspace consolidation.  No pleural effusions. No pneumothorax. Normal pulmonary vascularity. IMPRESSION:  COPD/emphysema. Chronic elevation the left hemidiaphragm with chronic scar/atelectasis at the left lung base. No acute cardiopulmonary disease. Electronically Signed   By: Evangeline Dakin M.D.   On: 04/06/2015 09:15    Micro Results      No results found for this or any previous visit (from the past 240 hour(s)).     Today   Subjective    Barry Molina today has no headache,no chest abdominal pain,no new weakness tingling or numbness, feels much better wants to go home today.     Objective   Blood pressure 125/75, pulse 83, temperature 98.2 F (36.8 C), temperature source Oral, resp. rate 20, height 5\' 11"  (1.803 m), weight 92.5 kg (203 lb 14.8 oz), SpO2 95 %.   Intake/Output Summary (Last 24 hours) at 04/07/15 1138 Last data filed at 04/06/15 1217  Gross per 24 hour  Intake      0 ml  Output    200 ml  Net   -200 ml    Exam Awake Alert, Oriented x 3, No new F.N deficits, Normal affect [place].AT,PERRAL Supple Neck,No JVD, No cervical lymphadenopathy appriciated.  Symmetrical Chest wall movement, Good air movement bilaterally, CTAB RRR,No Gallops,Rubs or new Murmurs, No Parasternal Heave +ve B.Sounds, Abd Soft, Non tender, No organomegaly appriciated, No rebound -guarding or rigidity. No Cyanosis, Clubbing or edema, No new Rash or bruise   Data Review   CBC w Diff:  Lab Results  Component Value Date   WBC 7.3 04/07/2015   HGB 13.6 04/07/2015   HCT 42.5 04/07/2015   PLT 217 04/07/2015   LYMPHOPCT 19 04/05/2015   MONOPCT 10 04/05/2015   EOSPCT 2 04/05/2015   BASOPCT 0 04/05/2015    CMP:  Lab Results  Component Value Date   NA 142 04/07/2015   K 3.4* 04/07/2015   CL 106 04/07/2015   CO2 26 04/07/2015   BUN 14 04/07/2015   CREATININE 1.02 04/07/2015   PROT 6.8 04/05/2015   ALBUMIN 3.8 04/05/2015   BILITOT 1.1 04/05/2015   ALKPHOS 76 04/05/2015   AST 31 04/05/2015   ALT 16*  04/05/2015  .   Total Time in preparing paper work, data evaluation and todays exam - 35 minutes  Thurnell Lose M.D on 04/07/2015 at 11:38 AM  [facility]   Office  [phone]

## 2015-04-07 NOTE — Discharge Instructions (Signed)
Follow with Primary MD Walker Kehr, MD in 3 days   Get CBC, CMP, 2 view Chest X ray checked  by Primary MD next visit.    Activity: As tolerated with Full fall precautions use walker/cane & assistance as needed   Disposition Home    Diet:   Heart Healthy  with feeding assistance and aspiration precautions.  For Heart failure patients - Check your Weight same time everyday, if you gain over 2 pounds, or you develop in leg swelling, experience more shortness of breath or chest pain, call your Primary MD immediately. Follow Cardiac Low Salt Diet and 1.5 lit/day fluid restriction.   On your next visit with your primary care physician please Get Medicines reviewed and adjusted.   Please request your Prim.MD to go over all Hospital Tests and Procedure/Radiological results at the follow up, please get all Hospital records sent to your Prim MD by signing hospital release before you go home.   If you experience worsening of your admission symptoms, develop shortness of breath, life threatening emergency, suicidal or homicidal thoughts you must seek medical attention immediately by calling 911 or calling your MD immediately  if symptoms less severe.  You Must read complete instructions/literature along with all the possible adverse reactions/side effects for all the Medicines you take and that have been prescribed to you. Take any new Medicines after you have completely understood and accpet all the possible adverse reactions/side effects.   Do not drive, operating heavy machinery, perform activities at heights, swimming or participation in water activities or provide baby sitting services if your were admitted for syncope or siezures until you have seen by Primary MD or a Neurologist and advised to do so again.  Do not drive when taking Pain medications.    Do not take more than prescribed Pain, Sleep and Anxiety Medications  Special Instructions: If you have smoked or chewed Tobacco  in  the last 2 yrs please stop smoking, stop any regular Alcohol  and or any Recreational drug use.  Wear Seat belts while driving.   Please note  You were cared for by a hospitalist during your hospital stay. If you have any questions about your discharge medications or the care you received while you were in the hospital after you are discharged, you can call the unit and asked to speak with the hospitalist on call if the hospitalist that took care of you is not available. Once you are discharged, your primary care physician will handle any further medical issues. Please note that NO REFILLS for any discharge medications will be authorized once you are discharged, as it is imperative that you return to your primary care physician (or establish a relationship with a primary care physician if you do not have one) for your aftercare needs so that they can reassess your need for medications and monitor your lab values.

## 2015-04-07 NOTE — Progress Notes (Signed)
Pt discharged home with family. IV removed, telemetry discontinued. Discharge information given. Family without questions at this time. Left unit via wheelchair with volunteer services at 1520. [name]

## 2015-04-07 NOTE — Evaluation (Signed)
Clinical/Bedside Swallow Evaluation Patient Details  Name: Barry Molina MRN: [id number] Date of Birth: 11-02-1925  Today's Date: 04/07/2015 Time: SLP Start Time (ACUTE ONLY): 1055 SLP Stop Time (ACUTE ONLY): 1103 SLP Time Calculation (min) (ACUTE ONLY): 8 min  Past Medical History:  Past Medical History  Diagnosis Date  . B12 DEFICIENCY 12/09/2007  . COLONIC POLYPS, HX OF 12/14/2006  . CORONARY ARTERY BYPASS GRAFT, HX OF 08/26/2008  . CORONARY ARTERY DISEASE 12/14/2006  . Edema 08/27/2008  . ELBOW PAIN 10/06/2009  . GERD 12/14/2006  . HYPERLIPIDEMIA 12/14/2006  . HYPERTENSION 12/14/2006  . HYPOTHYROIDISM 12/14/2006  . LEG PAIN 05/10/2010  . MUSCLE PAIN 04/10/2007  . NEOPLASM, SKIN, UNCERTAIN BEHAVIOR [id number]  . Other specified acquired hypothyroidism 04/08/2007  . Personal history of other diseases of digestive disease 08/26/2008  . SHOULDER PAIN 12/19/2006  . Cataract   . Malignant melanoma of skin of upper limb, including shoulder ([place]) 09/07/2008    states he just had places removed;  not cancers   Past Surgical History:  Past Surgical History  Procedure Laterality Date  . Right ankle surgery    . Coronary artery bypass graft    . Rotator cuff repair      bilateral  . Spine surgery  8/12    LS spine cyst  . Back surgery  09/27/10  . Cholecystectomy  9/12   HPI:  82 y.o. male with PMH of hypertension, spine surgery, bilateral shoulder surgery, right ankle surgery, hyperlipidemia, GERD, hypothyroidism, CAD, S/P of CABG, B12 deficiency, skin melanoma, who presented with altered mental status. MRI negative for acute findings.   Assessment / Plan / Recommendation Clinical Impression  Pt's oropharyngeal swallow appears WFL. Recommend to continue regular diet textures and thin liquids. No SLP f/u indicated, although if AMS persists, pt will likely need 24/7 supervision upon d/c home for safety.    Aspiration Risk  Mild aspiration risk    Diet Recommendation Regular;Thin liquid    Liquid Administration via: Cup;Straw Medication Administration: Whole meds with liquid Supervision: Patient able to self feed;Intermittent supervision to cue for compensatory strategies Compensations: Slow rate;Small sips/bites;Minimize environmental distractions Postural Changes: Seated upright at 90 degrees    Other  Recommendations Oral Care Recommendations: Oral care BID   Follow up Recommendations  24 hour supervision/assistance    Frequency and Duration            Prognosis        Swallow Study   General HPI: 81 y.o. male with PMH of hypertension, spine surgery, bilateral shoulder surgery, right ankle surgery, hyperlipidemia, GERD, hypothyroidism, CAD, S/P of CABG, B12 deficiency, skin melanoma, who presented with altered mental status. MRI negative for acute findings. Type of Study: Bedside Swallow Evaluation Previous Swallow Assessment: none in chart Diet Prior to this Study: Regular;Thin liquids Temperature Spikes Noted: No Respiratory Status: Room air History of Recent Intubation: No Behavior/Cognition: Alert;Cooperative;Pleasant mood Oral Cavity Assessment: Within Functional Limits Oral Care Completed by SLP: No Vision: Functional for self-feeding Self-Feeding Abilities: Able to feed self Patient Positioning: Upright in chair Baseline Vocal Quality: Normal    Oral/Motor/Sensory Function Overall Oral Motor/Sensory Function: Within functional limits   Ice Chips Ice chips: Not tested   Thin Liquid Thin Liquid: Within functional limits Presentation: Self Fed;Straw    Nectar Thick Nectar Thick Liquid: Not tested   Honey Thick Honey Thick Liquid: Not tested   Puree Puree: Within functional limits Presentation: Self Fed;Spoon   Solid   GO   Solid: Within functional  limits Presentation: Self Fed    Functional Assessment Tool Used: skilled clinical judgment Functional Limitations: Swallowing Swallow Current Status [phone]): At least 1 percent but less than  20 percent impaired, limited or restricted Swallow Goal Status [phone]): At least 1 percent but less than 20 percent impaired, limited or restricted Swallow Discharge Status [phone]): At least 1 percent but less than 20 percent impaired, limited or restricted   Germain Osgood, M.A. CCC-SLP [phone]  Germain Osgood 04/07/2015,11:12 AM

## 2015-04-08 ENCOUNTER — Telehealth: Payer: Self-pay | Admitting: *Deleted

## 2015-04-08 NOTE — Telephone Encounter (Signed)
Transition Care Management Follow-up Telephone Call   Date discharged? 04/07/15   How have you been since you were released from the hospital? Spoke with pt daughter Merlene Laughter states he is doing ok   Do you understand why you were in the hospital? YES   Do you understand the discharge instructions? YES   Where were you discharged to? Home   Items Reviewed:  Medications reviewed: YES  Allergies reviewed: YES}  Dietary changes reviewed: NO  Referrals reviewed: No referral needed    Functional Questionnaire:   Activities of Daily Living (ADLs):   He states he are independent in the following: ambulation, bathing and hygiene, feeding, continence, grooming, toileting and dressing States he require assistance with the following: ambulation sometimes   Any transportation issues/concerns?: NO   Any patient concerns? NO   Confirmed importance and date/time of follow-up visits scheduled YES, 04/12/15  Provider Appointment booked with Dr. Alain Marion  Confirmed with patient if condition begins to worsen call PCP or go to the ER.  Patient was given the office number and encouraged to call back with question or concerns.  : YES

## 2015-04-12 ENCOUNTER — Other Ambulatory Visit (INDEPENDENT_AMBULATORY_CARE_PROVIDER_SITE_OTHER): Payer: Medicare Other

## 2015-04-12 ENCOUNTER — Ambulatory Visit (INDEPENDENT_AMBULATORY_CARE_PROVIDER_SITE_OTHER): Payer: Medicare Other | Admitting: Internal Medicine

## 2015-04-12 ENCOUNTER — Ambulatory Visit (INDEPENDENT_AMBULATORY_CARE_PROVIDER_SITE_OTHER)
Admission: RE | Admit: 2015-04-12 | Discharge: 2015-04-12 | Disposition: A | Discharge status: Home / Self Care | Payer: Medicare Other | Source: Ambulatory Visit | Attending: Family Medicine | Admitting: Family Medicine

## 2015-04-12 ENCOUNTER — Encounter: Payer: Self-pay | Admitting: Internal Medicine

## 2015-04-12 ENCOUNTER — Ambulatory Visit (INDEPENDENT_AMBULATORY_CARE_PROVIDER_SITE_OTHER): Payer: Medicare Other | Admitting: Family Medicine

## 2015-04-12 ENCOUNTER — Encounter: Payer: Self-pay | Admitting: Family Medicine

## 2015-04-12 VITALS — HR 64 | Wt 199.0 lb

## 2015-04-12 VITALS — BP 178/90 | HR 64 | Wt 199.0 lb

## 2015-04-12 DIAGNOSIS — R270 Ataxia, unspecified: Secondary | ICD-10-CM

## 2015-04-12 DIAGNOSIS — Z951 Presence of aortocoronary bypass graft: Secondary | ICD-10-CM

## 2015-04-12 DIAGNOSIS — M25512 Pain in left shoulder: Secondary | ICD-10-CM | POA: Diagnosis not present

## 2015-04-12 DIAGNOSIS — M12512 Traumatic arthropathy, left shoulder: Secondary | ICD-10-CM | POA: Diagnosis not present

## 2015-04-12 DIAGNOSIS — M75102 Unspecified rotator cuff tear or rupture of left shoulder, not specified as traumatic: Secondary | ICD-10-CM

## 2015-04-12 DIAGNOSIS — G9340 Encephalopathy, unspecified: Secondary | ICD-10-CM | POA: Diagnosis not present

## 2015-04-12 DIAGNOSIS — I2510 Atherosclerotic heart disease of native coronary artery without angina pectoris: Secondary | ICD-10-CM | POA: Diagnosis not present

## 2015-04-12 DIAGNOSIS — R634 Abnormal weight loss: Secondary | ICD-10-CM | POA: Diagnosis not present

## 2015-04-12 DIAGNOSIS — E538 Deficiency of other specified B group vitamins: Secondary | ICD-10-CM

## 2015-04-12 DIAGNOSIS — M12812 Other specific arthropathies, not elsewhere classified, left shoulder: Secondary | ICD-10-CM

## 2015-04-12 MED ORDER — TRAMADOL HCL 50 MG PO TABS: 50.0000 mg | ORAL_TABLET | Freq: Two times a day (BID) | ORAL | Status: DC | PRN

## 2015-04-12 NOTE — Progress Notes (Signed)
Subjective:  Patient ID: Barry Molina, male    DOB: April 30, 1925  Age: 82 y.o. MRN: [id number]  CC: No chief complaint on file.   HPI DADE KRUPNICK presents for an episode of confusion last week - resolved. Pt is back to normal himself. Hosp records - notes, EEG, MRI reviewed    Outpatient Prescriptions Prior to Visit  Medication Sig Dispense Refill  . aspirin 325 MG tablet Take 325 mg by mouth daily.      . Cholecalciferol (VITAMIN D3) 1000 UNITS CAPS Take 1,000 Units by mouth daily.     . cyanocobalamin 1000 MCG tablet Take 1,000 mcg by mouth daily.    . fluticasone (FLONASE) 50 MCG/ACT nasal spray USE 1 SPRAY IN EACH NOSTRIL DAILY AS NEEDED (Patient taking differently: USE 1 SPRAY IN EACH NOSTRIL DAILY AS NEEDED FOR ALLERGIES) 16 g 1  . furosemide (LASIX) 20 MG tablet Take 1-2 tablets (20-40 mg total) by mouth daily as needed for edema. 60 tablet 1  . levothyroxine (SYNTHROID, LEVOTHROID) 125 MCG tablet Take 1 tablet (125 mcg total) by mouth daily. 30 tablet 0  . metoprolol succinate (TOPROL-XL) 25 MG 24 hr tablet Take 0.5 tablets (12.5 mg total) by mouth daily. 45 tablet 3  . omeprazole (PRILOSEC) 40 MG capsule Take 1 capsule (40 mg total) by mouth daily. 90 capsule 3  . promethazine-codeine (PHENERGAN WITH CODEINE) 6.25-10 MG/5ML syrup Take 5 mLs by mouth every 4 (four) hours as needed for cough. 300 mL 0  . traMADol (ULTRAM) 50 MG tablet Take 1-2 tablets (50-100 mg total) by mouth every 12 (twelve) hours as needed for severe pain. 100 tablet 3   Facility-Administered Medications Prior to Visit  Medication Dose Route Frequency Provider Last Rate Last Dose  . methylPREDNISolone acetate (DEPO-MEDROL) injection 40 mg  40 mg Intra-articular Once Aleksei Plotnikov V, MD      . methylPREDNISolone acetate (DEPO-MEDROL) injection 40 mg  40 mg Intra-articular Once Aleksei Plotnikov V, MD        ROS Review of Systems  Constitutional: Negative for appetite change, fatigue and unexpected  weight change.  HENT: Negative for congestion, nosebleeds, sneezing, sore throat and trouble swallowing.   Eyes: Negative for itching and visual disturbance.  Respiratory: Negative for cough.   Cardiovascular: Negative for chest pain, palpitations and leg swelling.  Gastrointestinal: Negative for nausea, diarrhea, blood in stool and abdominal distention.  Genitourinary: Negative for frequency and hematuria.  Musculoskeletal: Positive for back pain, arthralgias and gait problem. Negative for joint swelling, neck pain and neck stiffness.  Skin: Negative for rash.  Neurological: Negative for dizziness, tremors, speech difficulty and weakness.  Psychiatric/Behavioral: Negative for sleep disturbance, dysphoric mood and agitation. The patient is not nervous/anxious.     Objective:  BP 178/90 mmHg  Pulse 64  Wt 199 lb (90.266 kg)  SpO2 97%  BP Readings from Last 3 Encounters:  04/12/15 178/90  04/07/15 125/75  03/31/15 138/83    Wt Readings from Last 3 Encounters:  04/12/15 199 lb (90.266 kg)  04/12/15 199 lb (90.266 kg)  04/06/15 203 lb 14.8 oz (92.5 kg)    Physical Exam  Constitutional: He is oriented to person, place, and time. He appears well-developed. No distress.  NAD  HENT:  Mouth/Throat: Oropharynx is clear and moist.  Eyes: Conjunctivae are normal. Pupils are equal, round, and reactive to light.  Neck: Normal range of motion. No JVD present. No thyromegaly present.  Cardiovascular: Normal rate, regular rhythm, normal heart sounds  and intact distal pulses.  Exam reveals no gallop and no friction rub.   No murmur heard. Pulmonary/Chest: Effort normal and breath sounds normal. No respiratory distress. He has no wheezes. He has no rales. He exhibits no tenderness.  Abdominal: Soft. Bowel sounds are normal. He exhibits no distension and no mass. There is no tenderness. There is no rebound and no guarding.  Musculoskeletal: Normal range of motion. He exhibits tenderness. He  exhibits no edema.  Lymphadenopathy:    He has no cervical adenopathy.  Neurological: He is alert and oriented to person, place, and time. He has normal reflexes. No cranial nerve deficit. He exhibits normal muscle tone. He displays a negative Romberg sign. Coordination abnormal. Gait normal.  Skin: Skin is warm and dry. No rash noted.  Psychiatric: He has a normal mood and affect. His behavior is normal. Judgment and thought content normal.  ataxic A/o/c  Lab Results  Component Value Date   WBC 7.3 04/07/2015   HGB 13.6 04/07/2015   HCT 42.5 04/07/2015   PLT 217 04/07/2015   GLUCOSE 89 04/07/2015   CHOL 223* 03/31/2015   TRIG 125.0 03/31/2015   HDL 40.00 03/31/2015   LDLDIRECT 189.0 01/28/2009   LDLCALC 158* 03/31/2015   ALT 16* 04/05/2015   AST 31 04/05/2015   NA 142 04/07/2015   K 3.4* 04/07/2015   CL 106 04/07/2015   CREATININE 1.02 04/07/2015   BUN 14 04/07/2015   CO2 26 04/07/2015   TSH 8.041* 04/06/2015   PSA 2.77 03/31/2015   INR 1.17 04/06/2015    Ct Head Wo Contrast  04/05/2015  CLINICAL DATA:  Fall at home 2 days ago.  Intermittent confusion. EXAM: CT HEAD WITHOUT CONTRAST TECHNIQUE: Contiguous axial images were obtained from the base of the skull through the vertex without intravenous contrast. COMPARISON:  CT head without contrast 05/28/2013 FINDINGS: Progressive moderate periventricular and subcortical white matter hypoattenuation is noted bilaterally. Mild to moderate generalized atrophy is present. A lacunar infarct in the right caudate is new since prior exam, but appears acute. No acute hemorrhage or mass lesion is present. There is no significant extra-axial fluid collection. The ventricles are proportionate to the degree of atrophy. The paranasal sinuses and mastoid air cells are clear. The calvarium is intact. No significant extracranial soft tissue lesion is present. IMPRESSION: 1. No acute intracranial abnormality or significant interval change. 2. Progressive  white matter disease. 3. Lacunar infarct of the right basal ganglia was not present on the prior exam but appears remote. Electronically Signed   By: San Morelle M.D.   On: 04/05/2015 15:18   Mr Jeri Cos [id number] Contrast  04/06/2015  CLINICAL DATA:  Altered mental status and worsening confusion. Possible stroke. History of hypertension, hyperlipidemia, melanoma. EXAM: MRI HEAD WITHOUT AND WITH CONTRAST TECHNIQUE: Multiplanar, multiecho pulse sequences of the brain and surrounding structures were obtained without and with intravenous contrast. CONTRAST:  20 cc MultiHance COMPARISON:  CT head April 05, 2015 and CT head May 28, 2013 and December 06, 2012 FINDINGS: Multiple sequences are motion degraded. The ventricles and sulci are normal for patient's age. No abnormal parenchymal signal, mass effect. Lentiform homogeneously T2 bright 14 x 11 mm LEFT occipital lesion without enhancement is stable from 2014. Patchy to confluent supratentorial white matter FLAIR T2 hyperintensities. No abnormal parenchymal enhancement, though coronal post gadolinium sequences severely motion degraded. No reduced diffusion to suggest acute ischemia. No susceptibility artifact to suggest hemorrhage. Old RIGHT caudate body lacunar infarct. Additional bilateral basal  the ganglia prominent perivascular spaces. No abnormal extra-axial fluid collections. No extra-axial masses nor leptomeningeal enhancement. Normal major intracranial vascular flow voids seen at the skull base. Status post bilateral ocular lens implants. No suspicious calvarial bone marrow signal. No abnormal sellar expansion. Craniocervical junction maintained. Trace paranasal sinus mucosal thickening without air-fluid levels. The mastoid air cells are well aerated. IMPRESSION: No acute intracranial process on this motion degraded examination, specifically no acute ischemia. 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 Involutional changes. Moderate to severe chronic small vessel ischemic disease. Old RIGHT basal ganglia lacunar infarct. Electronically Signed   By: Elon Alas M.D.   On: 04/06/2015 02:30   Dg Chest Port 1 View  04/06/2015  CLINICAL DATA:  82 year old who presented this admission with acute mental status changes. Prior CABG. Current history of coronary artery disease and vitamin B12 deficiency. EXAM: PORTABLE CHEST 1 VIEW COMPARISON:  11/01/2010, 09/23/2010. FINDINGS: Prior sternotomy for CABG. Cardiac silhouette upper normal in size to slightly enlarged, unchanged. Thoracic aorta atherosclerotic, unchanged. Hilar and mediastinal contours otherwise unremarkable. Chronic elevation of the left hemidiaphragm with chronic scar/atelectasis at the left lung base. Lungs otherwise clear. Emphysematous changes in the upper lobes as noted previously. No localized airspace consolidation. No pleural effusions. No pneumothorax. Normal pulmonary vascularity. IMPRESSION: COPD/emphysema. Chronic elevation the left hemidiaphragm with chronic scar/atelectasis at the left lung base. No acute cardiopulmonary disease. Electronically Signed   By: Evangeline Dakin M.D.   On: 04/06/2015 09:15    Assessment & Plan:   There are no diagnoses linked to this encounter. I have discontinued Mr. Stvincent promethazine-codeine. I have also changed his traMADol. Additionally, I am having him maintain his aspirin, Vitamin D3, fluticasone, cyanocobalamin, metoprolol succinate, omeprazole, furosemide, and levothyroxine. We will continue to administer methylPREDNISolone acetate and methylPREDNISolone acetate.  Meds ordered this encounter  Medications  . traMADol (ULTRAM) 50 MG tablet    Sig: Take 1 tablet (50 mg total) by mouth every 12 (twelve) hours as needed for severe pain.    Dispense:  100 tablet    Refill:  3     Follow-up: Return in about 6 weeks (around 05/24/2015) for a follow-up visit.  Walker Kehr, MD

## 2015-04-12 NOTE — Progress Notes (Signed)
Pre visit review using our clinic review tool, if applicable. No additional management support is needed unless otherwise documented below in the visit note. 

## 2015-04-12 NOTE — Assessment & Plan Note (Addendum)
Patient given injection today and tolerated the procedure well. We discussed icing, I do want to get x-rays because I do think patient may have had a fall with patient having some mild hemarthrosis. We'll see if this shows any type of an occult fracture. Patient is having worsening symptoms and I do not know if this will actually help with the severe amount of arthritis he has in the shoulder. We discussed possible radicular symptoms coming from the neck I could also be contribute in. Patient had a negative Spurling's today though. Good range of motion of the neck. Discussed monitoring for any other changes. Discussed the potential compression sleeve to give some benefits. We discussed other modalities at home that can be helpful. Patient can follow-up if repeat injection is necessary in the near future. History of melanoma in the area as well that we'll possibly need to do further advanced imaging. We need to discuss total if any significant change and management would occur.  Spent  25 minutes with patient face-to-face and had greater than 50% of counseling including as described above in assessment and plan.

## 2015-04-12 NOTE — Progress Notes (Signed)
[name] [address] [place], [place] [address] Phone: [phone] Subjective:     CC: Follow-up left shoulder   [id number] Barry Molina is a 82 y.o. male follow-up for left shoulder pain. Severe arthritis of the left shoulder. Patient had weeks ago was given an injection. Patient since then has had a workup for possible TIA like symptoms. Patient does not remember any type of fall but has had worsening in the left shoulder. States that it seems to be anterior and go more posterior as well. Affecting all daily activities. Waking him up at night. Still though does not want to do any surgical intervention.statesof pain is unrelentinghurting him with any type of motion and can even wake him up at night. Does not remember any true injury.   x-rays are pending.  Past Medical History  Diagnosis Date  . B12 DEFICIENCY 12/09/2007  . COLONIC POLYPS, HX OF 12/14/2006  . CORONARY ARTERY BYPASS GRAFT, HX OF 08/26/2008  . CORONARY ARTERY DISEASE 12/14/2006  . Edema 08/27/2008  . ELBOW PAIN 10/06/2009  . GERD 12/14/2006  . HYPERLIPIDEMIA 12/14/2006  . HYPERTENSION 12/14/2006  . HYPOTHYROIDISM 12/14/2006  . LEG PAIN 05/10/2010  . MUSCLE PAIN 04/10/2007  . NEOPLASM, SKIN, UNCERTAIN BEHAVIOR [id number]  . Other specified acquired hypothyroidism 04/08/2007  . Personal history of other diseases of digestive disease 08/26/2008  . SHOULDER PAIN 12/19/2006  . Cataract   . Malignant melanoma of skin of upper limb, including shoulder ([place]) 09/07/2008    states he just had places removed;  not cancers   Past Surgical History  Procedure Laterality Date  . Right ankle surgery    . Coronary artery bypass graft    . Rotator cuff repair      bilateral  . Spine surgery  8/12    LS spine cyst  . Back surgery  09/27/10  . Cholecystectomy  9/12   Social History  Substance Use Topics  . Smoking status: Never Smoker   . Smokeless tobacco: None  . Alcohol Use: No    Allergies  Allergen Reactions  . Ezetimibe-Simvastatin Other (See Comments)     aches  . Morphine Nausea And Vomiting   Family History  Problem Relation Age of Onset  . Heart disease Other   . Hypertension Father   . Cancer Father   . Stroke Mother 32     Past medical history, social, surgical and family history all reviewed in electronic medical record.   Review of Systems: No headache, visual changes, nausea, vomiting, diarrhea, constipation, dizziness, abdominal pain, skin rash, fevers, chills, night sweats, weight loss, swollen lymph nodes, body aches, joint swelling, muscle aches, chest pain, shortness of breath, mood changes.   Objective Pulse 64, weight 199 lb (90.266 kg), SpO2 97 %.  General: No apparent distress alert and oriented x3 mood and affect normal, dressed appropriately.  HEENT: Pupils equal, extraocular movements intact  Respiratory: Patient's speak in full sentences and does not appear short of breath  Cardiovascular: No lower extremity edema, non tender, no erythema  Skin: Warm dry intact with no signs of infection or rash on extremities or on axial skeleton.  Abdomen: Soft nontender  Neuro: Cranial nerves II through XII are intact, neurovascularly intact in all extremities with 2+ DTRs and 2+ pulses.  Lymph: No lymphadenopathy of posterior or anterior cervical chain or axillae bilaterally.  Gait antalgic gait MSK:  Non tender with full range of motion and good stability and symmetric strength  and tone of shoulders, elbows, wrist, hip, and ankles bilaterally. Significant osteophytic changes of multiple joints.   Shoulder: left atrophy of the left shoulder girdle musculaturebut symmetric Worsening pain over the humerus as well proximally..near the scar area again. ROM decreased in all planes with crepitus. Patient is unable to touch his contralateral shoulder with his hand secondary to pain. Rotator cuff strength 3 out of 5 compared to 5 out of 5 on the  contralateral side. Minor worsening from previous exam signs of impingement with positive Neer and Hawkin's tests, but negative empty can sign. Speeds and Yergason's tests normal. Normal scapular function observed. No painful arc and no drop arm sign. No apprehension sign Contralateral shoulder unremarkable   Procedure: Real-time Ultrasound Guided Injection of left glenohumeral joint Device: GE Logiq E  Ultrasound guided injection is preferred based studies that show increased duration, increased effect, greater accuracy, decreased procedural pain, increased response rate with ultrasound guided versus blind injection.  Verbal informed consent obtained.  Time-out conducted.  Noted no overlying erythema, induration, or other signs of local infection.  Skin prepped in a sterile fashion.  Local anesthesia: Topical Ethyl chloride.  With sterile technique and under real time ultrasound guidance:  Joint visualized.  23g 1  inch needle inserted posterior approach. Pictures taken for needle placement. Patient did have injection of  2 cc of 0.5% Marcaine, and 1cc uof Kenalog 40 mg/dL. patient did have aspiration of 1 mL of blood removed.  Completed without difficulty  Pain immediately resolved suggesting accurate placement of the medication.  Advised to call if fevers/chills, erythema, induration, drainage, or persistent bleeding.  Images permanently stored and available for review in the ultrasound unit.  Impression: Technically successful ultrasound guided injection.      Impression and Recommendations:     This case required medical decision making of moderate complexity.

## 2015-04-12 NOTE — Patient Instructions (Signed)
Good to see you  Tylenol 325mg  3 times daily for the pain  Xrays today  Increase vitamin D 4000 IU daily for next 2 weeks then 2000 IU daily thereafter Heat 20 minutes, ice 20 minutes and off 20 minutes.  We have orthovisc asa a possibility  See me when you need me

## 2015-04-13 ENCOUNTER — Telehealth: Payer: Self-pay | Admitting: *Deleted

## 2015-04-13 NOTE — Telephone Encounter (Signed)
Noted. Thx.

## 2015-04-13 NOTE — Telephone Encounter (Signed)
Pt's family does not want him driving after his recent ER visit. Patient wants to know PCP's advisement.

## 2015-04-13 NOTE — Telephone Encounter (Signed)
Per PCP: As discussed with patient and pt's daughter no driving x 1 more week. Pt's granddaughter, Ria Comment informed.

## 2015-04-14 ENCOUNTER — Encounter: Payer: Self-pay | Admitting: Internal Medicine

## 2015-04-14 NOTE — Assessment & Plan Note (Signed)
Chronic. No angina Toprol, ASA

## 2015-04-14 NOTE — Assessment & Plan Note (Signed)
2/17 concussion vs TIA vs accidental tramadol overuse Mediset for meds

## 2015-04-14 NOTE — Assessment & Plan Note (Signed)
  Chronic CAD. No angina Toprol, ASA

## 2015-04-14 NOTE — Assessment & Plan Note (Signed)
Wt Readings from Last 3 Encounters:  04/12/15 199 lb (90.266 kg)  04/12/15 199 lb (90.266 kg)  04/06/15 203 lb 14.8 oz (92.5 kg)

## 2015-04-14 NOTE — Assessment & Plan Note (Signed)
PT offered 

## 2015-04-14 NOTE — Telephone Encounter (Signed)
Stacey, please,ask the pt/family if we need to ref to home PT? Thx

## 2015-04-14 NOTE — Assessment & Plan Note (Signed)
On B12 

## 2015-04-15 NOTE — Telephone Encounter (Signed)
Per pt's daughter PT is already in the home.

## 2015-05-03 ENCOUNTER — Other Ambulatory Visit: Payer: Self-pay | Admitting: *Deleted

## 2015-05-03 MED ORDER — LEVOTHYROXINE SODIUM 125 MCG PO TABS: 125.0000 ug | ORAL_TABLET | Freq: Every day | ORAL | Status: DC

## 2015-05-07 ENCOUNTER — Telehealth: Payer: Self-pay

## 2015-05-07 NOTE — Telephone Encounter (Signed)
Home Health Cert/Plan of Care received (04/09/2015 - 06/07/2015) and placed on MD's desk for signature

## 2015-05-10 DIAGNOSIS — I10 Essential (primary) hypertension: Secondary | ICD-10-CM | POA: Diagnosis not present

## 2015-05-24 ENCOUNTER — Ambulatory Visit: Payer: Medicare Other | Admitting: Internal Medicine

## 2015-06-01 ENCOUNTER — Other Ambulatory Visit (INDEPENDENT_AMBULATORY_CARE_PROVIDER_SITE_OTHER): Payer: Medicare Other

## 2015-06-01 ENCOUNTER — Encounter: Payer: Self-pay | Admitting: Internal Medicine

## 2015-06-01 ENCOUNTER — Ambulatory Visit: Payer: Medicare Other | Admitting: Internal Medicine

## 2015-06-01 ENCOUNTER — Ambulatory Visit (INDEPENDENT_AMBULATORY_CARE_PROVIDER_SITE_OTHER): Payer: Medicare Other | Admitting: Internal Medicine

## 2015-06-01 VITALS — BP 152/84 | HR 65 | Wt 202.0 lb

## 2015-06-01 DIAGNOSIS — E038 Other specified hypothyroidism: Secondary | ICD-10-CM

## 2015-06-01 DIAGNOSIS — E034 Atrophy of thyroid (acquired): Secondary | ICD-10-CM

## 2015-06-01 DIAGNOSIS — I2510 Atherosclerotic heart disease of native coronary artery without angina pectoris: Secondary | ICD-10-CM | POA: Diagnosis not present

## 2015-06-01 DIAGNOSIS — K5900 Constipation, unspecified: Secondary | ICD-10-CM | POA: Insufficient documentation

## 2015-06-01 DIAGNOSIS — E785 Hyperlipidemia, unspecified: Secondary | ICD-10-CM

## 2015-06-01 DIAGNOSIS — K5901 Slow transit constipation: Secondary | ICD-10-CM

## 2015-06-01 LAB — BASIC METABOLIC PANEL
BUN: 20 mg/dL (ref 6–23)
CALCIUM: 9.5 mg/dL (ref 8.4–10.5)
CO2: 31 meq/L (ref 19–32)
Chloride: 106 mEq/L (ref 96–112)
Creatinine, Ser: 0.89 mg/dL (ref 0.40–1.50)
GFR: 85.7 mL/min (ref >60.00)
GLUCOSE: 108 mg/dL — AB (ref 70–99)
POTASSIUM: 3.9 meq/L (ref 3.5–5.1)
SODIUM: 143 meq/L (ref 135–145)

## 2015-06-01 MED ORDER — LINACLOTIDE 145 MCG PO CAPS: 145.0000 ug | ORAL_CAPSULE | ORAL | Status: DC

## 2015-06-01 NOTE — Assessment & Plan Note (Signed)
Statin intolerant 

## 2015-06-01 NOTE — Assessment & Plan Note (Signed)
Levothroid 

## 2015-06-01 NOTE — Progress Notes (Signed)
Pre visit review using our clinic review tool, if applicable. No additional management support is needed unless otherwise documented below in the visit note. 

## 2015-06-01 NOTE — Assessment & Plan Note (Signed)
Linzess prn 

## 2015-06-01 NOTE — Progress Notes (Signed)
Subjective:  Patient ID: Barry Molina, male    DOB: [birth year]  Age: 82 y.o. MRN: [id number]  CC: No chief complaint on file.   HPI [name] presents for a HTN, CAD, CVA f/u. C/o constipation   Outpatient Prescriptions Prior to Visit  Medication Sig Dispense Refill  . aspirin 325 MG tablet Take 325 mg by mouth daily.      . Cholecalciferol (VITAMIN D3) 1000 UNITS CAPS Take 1,000 Units by mouth daily.     . cyanocobalamin 1000 MCG tablet Take 1,000 mcg by mouth daily.    . fluticasone (FLONASE) 50 MCG/ACT nasal spray USE 1 SPRAY IN EACH NOSTRIL DAILY AS NEEDED (Patient taking differently: USE 1 SPRAY IN EACH NOSTRIL DAILY AS NEEDED FOR ALLERGIES) 16 g 1  . furosemide (LASIX) 20 MG tablet Take 1-2 tablets (20-40 mg total) by mouth daily as needed for edema. 60 tablet 1  . levothyroxine (SYNTHROID, LEVOTHROID) 125 MCG tablet Take 1 tablet (125 mcg total) by mouth daily. 90 tablet 3  . metoprolol succinate (TOPROL-XL) 25 MG 24 hr tablet Take 0.5 tablets (12.5 mg total) by mouth daily. 45 tablet 3  . omeprazole (PRILOSEC) 40 MG capsule Take 1 capsule (40 mg total) by mouth daily. 90 capsule 3  . traMADol (ULTRAM) 50 MG tablet Take 1 tablet (50 mg total) by mouth every 12 (twelve) hours as needed for severe pain. 100 tablet 3   Facility-Administered Medications Prior to Visit  Medication Dose Route Frequency Provider Last Rate Last Dose  . methylPREDNISolone acetate (DEPO-MEDROL) injection 40 mg  40 mg Intra-articular Once Renaye Janicki V, MD      . methylPREDNISolone acetate (DEPO-MEDROL) injection 40 mg  40 mg Intra-articular Once Leniya Breit V, MD        ROS Review of Systems  Constitutional: Positive for fatigue. Negative for appetite change and unexpected weight change.  HENT: Negative for congestion, nosebleeds, sneezing, sore throat and trouble swallowing.   Eyes: Negative for itching and visual disturbance.  Respiratory: Negative for cough.   Cardiovascular:  Negative for chest pain, palpitations and leg swelling.  Gastrointestinal: Positive for constipation. Negative for nausea, diarrhea, blood in stool and abdominal distention.  Genitourinary: Negative for frequency and hematuria.  Musculoskeletal: Positive for arthralgias and gait problem. Negative for back pain, joint swelling and neck pain.  Skin: Negative for rash.  Neurological: Negative for dizziness, tremors, speech difficulty and weakness.  Psychiatric/Behavioral: Positive for sleep disturbance. Negative for suicidal ideas, behavioral problems, dysphoric mood, decreased concentration and agitation. The patient is not nervous/anxious.   feeling lonely  Objective:  BP 152/84 mmHg  Pulse 65  Wt 202 lb (91.627 kg)  SpO2 96%  BP Readings from Last 3 Encounters:  06/01/15 152/84  04/12/15 178/90  04/07/15 125/75    Wt Readings from Last 3 Encounters:  06/01/15 202 lb (91.627 kg)  04/12/15 199 lb (90.266 kg)  04/12/15 199 lb (90.266 kg)    Physical Exam  Constitutional: He is oriented to person, place, and time. He appears well-developed. No distress.  NAD  HENT:  Mouth/Throat: Oropharynx is clear and moist.  Eyes: Conjunctivae are normal. Pupils are equal, round, and reactive to light.  Neck: Normal range of motion. No JVD present. No thyromegaly present.  Cardiovascular: Normal rate, regular rhythm, normal heart sounds and intact distal pulses.  Exam reveals no gallop and no friction rub.   No murmur heard. Pulmonary/Chest: Effort normal and breath sounds normal. No respiratory distress. He has  no wheezes. He has no rales. He exhibits no tenderness.  Abdominal: Soft. Bowel sounds are normal. He exhibits no distension and no mass. There is no tenderness. There is no rebound and no guarding.  Musculoskeletal: Normal range of motion. He exhibits tenderness. He exhibits no edema.  Lymphadenopathy:    He has no cervical adenopathy.  Neurological: He is alert and oriented to  person, place, and time. He has normal reflexes. No cranial nerve deficit. He exhibits normal muscle tone. He displays a negative Romberg sign. Coordination abnormal. Gait normal.  Skin: Skin is warm and dry. No rash noted. No pallor.  Psychiatric: He has a normal mood and affect. His behavior is normal. Judgment and thought content normal.  Cane Limp  Lab Results  Component Value Date   WBC 7.3 04/07/2015   HGB 13.6 04/07/2015   HCT 42.5 04/07/2015   PLT 217 04/07/2015   GLUCOSE 89 04/07/2015   CHOL 223* 03/31/2015   TRIG 125.0 03/31/2015   HDL 40.00 03/31/2015   LDLDIRECT 189.0 01/28/2009   LDLCALC 158* 03/31/2015   ALT 16* 04/05/2015   AST 31 04/05/2015   NA 142 04/07/2015   K 3.4* 04/07/2015   CL 106 04/07/2015   CREATININE 1.02 04/07/2015   BUN 14 04/07/2015   CO2 26 04/07/2015   TSH 8.041* 04/06/2015   PSA 2.77 03/31/2015   INR 1.17 04/06/2015    Dg Cervical Spine Complete  04/12/2015  CLINICAL DATA:  Chronic pain, left shoulder pain.  No known injury. EXAM: CERVICAL SPINE - COMPLETE 4+ VIEW COMPARISON:  CT 05/28/2013 FINDINGS: Degenerative disc disease changes at C5-6 and C6-7. Degenerative facet disease bilaterally diffusely, left greater than right. For mild left neural foraminal narrowing at C5-6 and C6-7. Normal alignment. No fracture. Prevertebral soft tissues are normal. IMPRESSION: Cervical spondylosis.  No acute findings. Electronically Signed   By: Rolm Baptise M.D.   On: 04/12/2015 16:48   [place] Extrem Up Left Ltd  04/13/2015  Procedure: Real-time Ultrasound Guided Injection of left glenohumeral joint Device: GE Logiq E  Ultrasound guided injection is preferred based studies that show increased duration, increased effect, greater accuracy, decreased procedural pain, increased response rate with ultrasound guided versus blind injection.  Verbal informed consent obtained.  Time-out conducted.  Noted no overlying erythema, induration, or other signs of local  infection.  Skin prepped in a sterile fashion.  Local anesthesia: Topical Ethyl chloride.  With sterile technique and under real time ultrasound guidance: Joint visualized. 23g 1  inch needle inserted posterior approach. Pictures taken for needle placement. Patient did have injection of 2 cc of 0.5% Marcaine, and 1cc uof Kenalog 40 mg/dL. patient did have aspiration of 1 mL of blood removed.  Completed without difficulty  Pain immediately resolved suggesting accurate placement of the medication.  Advised to call if fevers/chills, erythema, induration, drainage, or persistent bleeding.  Images permanently stored and available for review in the ultrasound unit.  Impression: Technically successful ultrasound guided injection.  Dg Shoulder Left  04/12/2015  CLINICAL DATA:  Low chronic left shoulder pain with history of rotator cuff repair, initial encounter EXAM: LEFT SHOULDER - 2+ VIEW COMPARISON:  02/17/2015 FINDINGS: Postsurgical changes are noted in the proximal humerus. Degenerative changes at the acromioclavicular joint are seen. No acute fracture or dislocation is noted. No gross soft tissue abnormality is seen. IMPRESSION: Chronic changes without acute abnormality. Electronically Signed   By: Inez Catalina M.D.   On: 04/12/2015 16:46   Dg Humerus Left  04/12/2015  CLINICAL DATA:  Chronic left arm and shoulder pain EXAM: LEFT HUMERUS - 2+ VIEW COMPARISON:  04/12/2015, 04/19/2014 FINDINGS: Stable postop changes of the left shoulder. Similar moderately severe left AC joint and glenohumeral joint degenerative osteoarthritis with joint space loss, sclerosis and bony spurring. Normal alignment. No subluxation or dislocation. Humerus appears intact. Stable exam. IMPRESSION: Stable postoperative findings.  No malalignment or acute fracture. Left shoulder degenerative changes. Electronically Signed   By: Jerilynn Mages.  Shick M.D.   On: 04/12/2015 16:48    Assessment & Plan:   There are no diagnoses linked to  this encounter. I am having Mr. Ledman maintain his aspirin, Vitamin D3, fluticasone, cyanocobalamin, metoprolol succinate, omeprazole, furosemide, traMADol, and levothyroxine. We will continue to administer methylPREDNISolone acetate and methylPREDNISolone acetate.  No orders of the defined types were placed in this encounter.     Follow-up: No Follow-up on file.  Walker Kehr, MD

## 2015-06-01 NOTE — Assessment & Plan Note (Signed)
Toprol, ASA 

## 2015-06-09 ENCOUNTER — Ambulatory Visit (INDEPENDENT_AMBULATORY_CARE_PROVIDER_SITE_OTHER): Payer: Medicare Other | Admitting: Family Medicine

## 2015-06-09 ENCOUNTER — Other Ambulatory Visit (INDEPENDENT_AMBULATORY_CARE_PROVIDER_SITE_OTHER): Payer: Medicare Other

## 2015-06-09 ENCOUNTER — Encounter: Payer: Self-pay | Admitting: Family Medicine

## 2015-06-09 VITALS — BP 146/82 | HR 66

## 2015-06-09 DIAGNOSIS — M25512 Pain in left shoulder: Secondary | ICD-10-CM

## 2015-06-09 DIAGNOSIS — M12512 Traumatic arthropathy, left shoulder: Secondary | ICD-10-CM | POA: Diagnosis not present

## 2015-06-09 DIAGNOSIS — M12812 Other specific arthropathies, not elsewhere classified, left shoulder: Secondary | ICD-10-CM

## 2015-06-09 DIAGNOSIS — M75102 Unspecified rotator cuff tear or rupture of left shoulder, not specified as traumatic: Secondary | ICD-10-CM

## 2015-06-09 NOTE — Progress Notes (Signed)
[facility] [address] [place], [place] [address] Phone: [phone] Subjective:     CC: Follow-up left shoulder f/u   [id number] KAININ HOSP is a 82 y.o. male follow-up for left shoulder pain. Severe arthritis of the left shoulder. Patient has severe arthritic changes of the shoulder. Has been trying conservative therapy with recurrent injections. Patient has had aspiration before the past as well. Started having worsening pain again. Making it difficult to do daily activities such as dressing. Waking him up at night again. Some increasing weakness. Continues to be active though as much as possible.   x-rays are pending.  Past Medical History  Diagnosis Date  . B12 DEFICIENCY 12/09/2007  . COLONIC POLYPS, HX OF 12/14/2006  . CORONARY ARTERY BYPASS GRAFT, HX OF 08/26/2008  . CORONARY ARTERY DISEASE 12/14/2006  . Edema 08/27/2008  . ELBOW PAIN 10/06/2009  . GERD 12/14/2006  . HYPERLIPIDEMIA 12/14/2006  . HYPERTENSION 12/14/2006  . HYPOTHYROIDISM 12/14/2006  . LEG PAIN 05/10/2010  . MUSCLE PAIN 04/10/2007  . NEOPLASM, SKIN, UNCERTAIN BEHAVIOR [id number]  . Other specified acquired hypothyroidism 04/08/2007  . Personal history of other diseases of digestive disease 08/26/2008  . SHOULDER PAIN 12/19/2006  . Cataract   . Malignant melanoma of skin of upper limb, including shoulder ([place]) 09/07/2008    states he just had places removed;  not cancers   Past Surgical History  Procedure Laterality Date  . Right ankle surgery    . Coronary artery bypass graft    . Rotator cuff repair      bilateral  . Spine surgery  8/12    LS spine cyst  . Back surgery  09/27/10  . Cholecystectomy  9/12   Social History  Substance Use Topics  . Smoking status: Never Smoker   . Smokeless tobacco: Not on file  . Alcohol Use: No   Allergies  Allergen Reactions  . Ezetimibe-Simvastatin Other (See Comments)     aches  . Morphine Nausea And Vomiting   Family  History  Problem Relation Age of Onset  . Heart disease Other   . Hypertension Father   . Cancer Father   . Stroke Mother 66     Past medical history, social, surgical and family history all reviewed in electronic medical record.   Review of Systems: No headache, visual changes, nausea, vomiting, diarrhea, constipation, dizziness, abdominal pain, skin rash, fevers, chills, night sweats, weight loss, swollen lymph nodes, body aches, joint swelling, muscle aches, chest pain, shortness of breath, mood changes.   Objective There were no vitals taken for this visit.  General: No apparent distress alert and oriented x3 mood and affect normal, dressed appropriately.  HEENT: Pupils equal, extraocular movements intact  Respiratory: Patient's speak in full sentences and does not appear short of breath  Cardiovascular: No lower extremity edema, non tender, no erythema  Skin: Warm dry intact with no signs of infection or rash on extremities or on axial skeleton.  Abdomen: Soft nontender  Neuro: Cranial nerves II through XII are intact, neurovascularly intact in all extremities with 2+ DTRs and 2+ pulses.  Lymph: No lymphadenopathy of posterior or anterior cervical chain or axillae bilaterally.  Gait antalgic gait MSK:  Non tender with full range of motion and good stability and symmetric strength and tone of shoulders, elbows, wrist, hip, and ankles bilaterally. Significant osteophytic changes of multiple joints.   Shoulder: left atrophy of the left shoulder girdle musculaturebut symmetric Worsening pain over the  humerus as well proximally..near the scar area again. ROM decreased in all planes with crepitus. Patient is unable to touch his contralateral shoulder with his hand secondary to pain. Rotator cuff strength 3 out of 5 compared to 5 out of 5 on the contralateral side. Minor worsening from previous exam signs of impingement with positive Neer and Hawkin's tests, but negative empty can  sign. Speeds and Yergason's tests normal. Normal scapular function observed. Painful arc which is new No apprehension sign Contralateral shoulder mild crepitus is well   Procedure: Real-time Ultrasound Guided Injection of left glenohumeral joint Device: GE Logiq E  Ultrasound guided injection is preferred based studies that show increased duration, increased effect, greater accuracy, decreased procedural pain, increased response rate with ultrasound guided versus blind injection.  Verbal informed consent obtained.  Time-out conducted.  Noted no overlying erythema, induration, or other signs of local infection.  Skin prepped in a sterile fashion.  Local anesthesia: Topical Ethyl chloride.  With sterile technique and under real time ultrasound guidance:  Joint visualized.  23g 1  inch needle inserted posterior approach. Pictures taken for needle placement. Patient did have injection of  2 cc of 0.5% Marcaine, and 1cc uof Kenalog 40 mg/dL. patient did have aspiration of 1 mL of blood removed.  Completed without difficulty  Pain immediately resolved suggesting accurate placement of the medication.  Advised to call if fevers/chills, erythema, induration, drainage, or persistent bleeding.  Images permanently stored and available for review in the ultrasound unit.  Impression: Technically successful ultrasound guided injection.      Impression and Recommendations:     This case required medical decision making of moderate complexity.

## 2015-06-09 NOTE — Assessment & Plan Note (Signed)
Attempted an injection again today. Patient has some any other comorbidities and he makes any other medications difficult to get. Patient has tramadol for breakthrough pain. We discussed icing regimen. We discussed continuing to monitor. We will continue to repeat injection every 10 weeks. Patient does not want any surgical intervention.  Spent  25 minutes with patient face-to-face and had greater than 50% of counseling including as described above in assessment and plan.

## 2015-06-09 NOTE — Patient Instructions (Signed)
Always good to see you  You are looking good.  Ice still is a good idea You know the drill and see me again in 10 weeks.

## 2015-07-04 ENCOUNTER — Emergency Department (HOSPITAL_COMMUNITY)
Admission: EM | Admit: 2015-07-04 | Discharge: 2015-07-05 | Disposition: A | Discharge status: Home / Self Care | Payer: Medicare Other | Attending: Emergency Medicine | Admitting: Emergency Medicine

## 2015-07-04 ENCOUNTER — Encounter (HOSPITAL_COMMUNITY): Payer: Self-pay | Admitting: Emergency Medicine

## 2015-07-04 ENCOUNTER — Emergency Department (HOSPITAL_COMMUNITY): Payer: Medicare Other

## 2015-07-04 DIAGNOSIS — Y9389 Activity, other specified: Secondary | ICD-10-CM | POA: Insufficient documentation

## 2015-07-04 DIAGNOSIS — I10 Essential (primary) hypertension: Secondary | ICD-10-CM | POA: Insufficient documentation

## 2015-07-04 DIAGNOSIS — S60417A Abrasion of left little finger, initial encounter: Secondary | ICD-10-CM | POA: Diagnosis not present

## 2015-07-04 DIAGNOSIS — S199XXA Unspecified injury of neck, initial encounter: Secondary | ICD-10-CM | POA: Insufficient documentation

## 2015-07-04 DIAGNOSIS — E039 Hypothyroidism, unspecified: Secondary | ICD-10-CM | POA: Insufficient documentation

## 2015-07-04 DIAGNOSIS — S60411A Abrasion of left index finger, initial encounter: Secondary | ICD-10-CM | POA: Diagnosis not present

## 2015-07-04 DIAGNOSIS — Z23 Encounter for immunization: Secondary | ICD-10-CM | POA: Diagnosis not present

## 2015-07-04 DIAGNOSIS — E538 Deficiency of other specified B group vitamins: Secondary | ICD-10-CM | POA: Diagnosis not present

## 2015-07-04 DIAGNOSIS — Y92410 Unspecified street and highway as the place of occurrence of the external cause: Secondary | ICD-10-CM | POA: Insufficient documentation

## 2015-07-04 DIAGNOSIS — S80212A Abrasion, left knee, initial encounter: Secondary | ICD-10-CM | POA: Insufficient documentation

## 2015-07-04 DIAGNOSIS — Z79899 Other long term (current) drug therapy: Secondary | ICD-10-CM | POA: Insufficient documentation

## 2015-07-04 DIAGNOSIS — Z85828 Personal history of other malignant neoplasm of skin: Secondary | ICD-10-CM | POA: Diagnosis not present

## 2015-07-04 DIAGNOSIS — I2510 Atherosclerotic heart disease of native coronary artery without angina pectoris: Secondary | ICD-10-CM | POA: Diagnosis not present

## 2015-07-04 DIAGNOSIS — Z951 Presence of aortocoronary bypass graft: Secondary | ICD-10-CM | POA: Diagnosis not present

## 2015-07-04 DIAGNOSIS — S0990XA Unspecified injury of head, initial encounter: Secondary | ICD-10-CM | POA: Diagnosis present

## 2015-07-04 DIAGNOSIS — Z85820 Personal history of malignant melanoma of skin: Secondary | ICD-10-CM | POA: Insufficient documentation

## 2015-07-04 DIAGNOSIS — K219 Gastro-esophageal reflux disease without esophagitis: Secondary | ICD-10-CM | POA: Diagnosis not present

## 2015-07-04 DIAGNOSIS — S0101XA Laceration without foreign body of scalp, initial encounter: Secondary | ICD-10-CM | POA: Insufficient documentation

## 2015-07-04 DIAGNOSIS — Y998 Other external cause status: Secondary | ICD-10-CM | POA: Insufficient documentation

## 2015-07-04 DIAGNOSIS — S80811A Abrasion, right lower leg, initial encounter: Secondary | ICD-10-CM | POA: Insufficient documentation

## 2015-07-04 MED ORDER — LIDOCAINE-EPINEPHRINE 1 %-1:100000 IJ SOLN
20.0000 mL | Freq: Once | INTRAMUSCULAR | Status: AC
  Administered 2015-07-04: 20 mL
  Filled 2015-07-04: qty 1

## 2015-07-04 MED ORDER — ACETAMINOPHEN 325 MG PO TABS
650.0000 mg | ORAL_TABLET | Freq: Once | ORAL | Status: AC
  Administered 2015-07-04: 650 mg via ORAL
  Filled 2015-07-04: qty 2

## 2015-07-04 MED ORDER — LIDOCAINE HCL (CARDIAC) 20 MG/ML IV SOLN
INTRAVENOUS | Status: AC
  Filled 2015-07-04: qty 5

## 2015-07-04 MED ORDER — TETANUS-DIPHTH-ACELL PERTUSSIS 5-2.5-18.5 LF-MCG/0.5 IM SUSP
0.5000 mL | Freq: Once | INTRAMUSCULAR | Status: AC
  Administered 2015-07-04: 0.5 mL via INTRAMUSCULAR
  Filled 2015-07-04: qty 0.5

## 2015-07-04 MED ORDER — LIDOCAINE HCL (PF) 1 % IJ SOLN
INTRAMUSCULAR | Status: AC
  Filled 2015-07-04: qty 5
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
## 2015-07-08 ENCOUNTER — Other Ambulatory Visit: Payer: Self-pay | Admitting: Family Medicine

## 2015-07-08 MED ORDER — VITAMIN D (ERGOCALCIFEROL) 1.25 MG (50000 UNIT) PO CAPS: 50000.0000 [IU] | ORAL_CAPSULE | ORAL | Status: DC

## 2015-07-08 MED ORDER — HYDROCODONE-ACETAMINOPHEN 7.5-325 MG PO TABS: 1.0000 | ORAL_TABLET | Freq: Three times a day (TID) | ORAL | Status: DC | PRN
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
## 2015-07-12 ENCOUNTER — Other Ambulatory Visit (INDEPENDENT_AMBULATORY_CARE_PROVIDER_SITE_OTHER): Payer: Medicare Other

## 2015-07-12 ENCOUNTER — Ambulatory Visit (INDEPENDENT_AMBULATORY_CARE_PROVIDER_SITE_OTHER)
Admission: RE | Admit: 2015-07-12 | Discharge: 2015-07-12 | Disposition: A | Discharge status: Home / Self Care | Payer: Medicare Other | Source: Ambulatory Visit | Attending: Internal Medicine | Admitting: Internal Medicine

## 2015-07-12 ENCOUNTER — Ambulatory Visit (INDEPENDENT_AMBULATORY_CARE_PROVIDER_SITE_OTHER): Payer: Medicare Other | Admitting: Internal Medicine

## 2015-07-12 ENCOUNTER — Encounter: Payer: Self-pay | Admitting: Internal Medicine

## 2015-07-12 VITALS — BP 110/82 | HR 86 | Temp 98.0°F

## 2015-07-12 DIAGNOSIS — M542 Cervicalgia: Secondary | ICD-10-CM

## 2015-07-12 DIAGNOSIS — F05 Delirium due to known physiological condition: Secondary | ICD-10-CM | POA: Diagnosis not present

## 2015-07-12 DIAGNOSIS — S12401D Unspecified nondisplaced fracture of fifth cervical vertebra, subsequent encounter for fracture with routine healing: Secondary | ICD-10-CM

## 2015-07-12 DIAGNOSIS — F0781 Postconcussional syndrome: Secondary | ICD-10-CM

## 2015-07-12 DIAGNOSIS — G4489 Other headache syndrome: Secondary | ICD-10-CM | POA: Diagnosis not present

## 2015-07-12 DIAGNOSIS — S12400A Unspecified displaced fracture of fifth cervical vertebra, initial encounter for closed fracture: Secondary | ICD-10-CM | POA: Insufficient documentation

## 2015-07-12 DIAGNOSIS — E538 Deficiency of other specified B group vitamins: Secondary | ICD-10-CM | POA: Diagnosis not present

## 2015-07-12 DIAGNOSIS — R51 Headache: Secondary | ICD-10-CM

## 2015-07-12 DIAGNOSIS — R519 Headache, unspecified: Secondary | ICD-10-CM | POA: Insufficient documentation

## 2015-07-12 LAB — URINALYSIS
HGB URINE DIPSTICK: NEGATIVE
Ketones, ur: NEGATIVE
LEUKOCYTES UA: NEGATIVE
NITRITE: NEGATIVE
Specific Gravity, Urine: 1.02 (ref 1.000–1.030)
Total Protein, Urine: NEGATIVE
UROBILINOGEN UA: 1 (ref 0.0–1.0)
Urine Glucose: NEGATIVE
pH: 5 (ref 5.0–8.0)

## 2015-07-12 LAB — CBC WITH DIFFERENTIAL/PLATELET
BASOS ABS: 0 10*3/uL (ref 0.0–0.1)
Basophils Relative: 0.4 % (ref 0.0–3.0)
Eosinophils Absolute: 0.1 10*3/uL (ref 0.0–0.7)
Eosinophils Relative: 0.6 % (ref 0.0–5.0)
HCT: 42 % (ref 39.0–52.0)
Hemoglobin: 14.3 g/dL (ref 13.0–17.0)
LYMPHS ABS: 1.6 10*3/uL (ref 0.7–4.0)
LYMPHS PCT: 18.5 % (ref 12.0–46.0)
MCHC: 34.1 g/dL (ref 30.0–36.0)
MCV: 96.3 fl (ref 78.0–100.0)
MONO ABS: 0.6 10*3/uL (ref 0.1–1.0)
MONOS PCT: 6.9 % (ref 3.0–12.0)
NEUTROS PCT: 73.6 % (ref 43.0–77.0)
Neutro Abs: 6.5 10*3/uL (ref 1.4–7.7)
Platelets: 276 10*3/uL (ref 150.0–400.0)
RBC: 4.36 Mil/uL (ref 4.22–5.81)
RDW: 14.2 % (ref 11.5–15.5)
WBC: 8.8 10*3/uL (ref 4.0–10.5)

## 2015-07-12 LAB — VITAMIN B12: Vitamin B-12: >1500 pg/mL — ABNORMAL HIGH (ref 211–911)

## 2015-07-12 LAB — BASIC METABOLIC PANEL
BUN: 31 mg/dL — ABNORMAL HIGH (ref 6–23)
CHLORIDE: 102 meq/L (ref 96–112)
CO2: 31 mEq/L (ref 19–32)
CREATININE: 1.1 mg/dL (ref 0.40–1.50)
Calcium: 9.7 mg/dL (ref 8.4–10.5)
GFR: 67.09 mL/min (ref >60.00)
Glucose, Bld: 108 mg/dL — ABNORMAL HIGH (ref 70–99)
POTASSIUM: 4.2 meq/L (ref 3.5–5.1)
SODIUM: 141 meq/L (ref 135–145)

## 2015-07-12 NOTE — Progress Notes (Signed)
Pre visit review using our clinic review tool, if applicable. No additional management support is needed unless otherwise documented below in the visit note. 
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
## 2015-07-12 NOTE — Patient Instructions (Signed)
Post-Concussion Syndrome  Post-concussion syndrome describes the symptoms that can occur after a head injury. These symptoms can last from weeks to months.  CAUSES   It is not clear why some head injuries cause post-concussion syndrome. It can occur whether your head injury was mild or severe and whether you were wearing head protection or not.   SIGNS AND SYMPTOMS  · Memory difficulties.  · Dizziness.  · Headaches.  · Double vision or blurry vision.  · Sensitivity to light.  · Hearing difficulties.  · Depression.  · Tiredness.  · Weakness.  · Difficulty with concentration.  · Difficulty sleeping or staying asleep.  · Vomiting.  · Poor balance or instability on your feet.  · Slow reaction time.  · Difficulty learning and remembering things you have heard.  DIAGNOSIS   There is no test to determine whether you have post-concussion syndrome. Your health care provider may order an imaging scan of your brain, such as a CT scan, to check for other problems that may be causing your symptoms (such as a severe injury inside your skull).  TREATMENT   Usually, these problems disappear over time without medical care. Your health care provider may prescribe medicine to help ease your symptoms. It is important to follow up with a neurologist to evaluate your recovery and address any lingering symptoms or issues.  HOME CARE INSTRUCTIONS   · Take medicines only as directed by your health care provider. Do not take aspirin. Aspirin can slow blood clotting.  · Sleep with your head slightly elevated to help with headaches.  · Avoid any situation where there is potential for another head injury. This includes football, hockey, soccer, basketball, martial arts, downhill snow sports, and horseback riding. Your condition will get worse every time you experience a concussion. You should avoid these activities until you are evaluated by the appropriate follow-up health care providers.  · Keep all follow-up visits as directed by your health  care provider. This is important.  SEEK MEDICAL CARE IF:  · You have increased problems paying attention or concentrating.  · You have increased difficulty remembering or learning new information.  · You need more time to complete tasks or assignments than before.  · You have increased irritability or decreased ability to cope with stress.  · You have more symptoms than before.  Seek medical care if you have any of the following symptoms for more than two weeks after your injury:  · Lasting (chronic) headaches.  · Dizziness or balance problems.  · Nausea.  · Vision problems.  · Increased sensitivity to noise or light.  · Depression or mood swings.  · Anxiety or irritability.  · Memory problems.  · Difficulty concentrating or paying attention.  · Sleep problems.  · Feeling tired all the time.  SEEK IMMEDIATE MEDICAL CARE IF:  · You have confusion or unusual drowsiness.  · Others find it difficult to wake you up.  · You have nausea or persistent, forceful vomiting.  · You feel like you are moving when you are not (vertigo). Your eyes may move rapidly back and forth.  · You have convulsions or faint.  · You have severe, persistent headaches that are not relieved by medicine.  · You cannot use your arms or legs normally.  · One of your pupils is larger than the other.  · You have clear or bloody discharge from your nose or ears.  · Your problems are getting worse, not better.  MAKE   SURE YOU:  · Understand these instructions.  · Will watch your condition.  · Will get help right away if you are not doing well or get worse.     This information is not intended to replace advice given to you by your health care provider. Make sure you discuss any questions you have with your health care provider.     Document Released: 08/05/2001 Document Revised: 03/06/2014 Document Reviewed: 05/21/2013  Elsevier Interactive Patient Education ©2016 [employer]
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
## 2015-07-27 ENCOUNTER — Other Ambulatory Visit: Payer: Self-pay | Admitting: Internal Medicine

## 2015-08-07 ENCOUNTER — Other Ambulatory Visit: Payer: Self-pay | Admitting: Internal Medicine

## 2015-08-23 ENCOUNTER — Other Ambulatory Visit: Payer: Self-pay | Admitting: Family Medicine

## 2015-08-23 ENCOUNTER — Other Ambulatory Visit: Payer: Self-pay | Admitting: Internal Medicine

## 2015-08-24 ENCOUNTER — Other Ambulatory Visit: Payer: Self-pay | Admitting: Internal Medicine

## 2015-08-24 NOTE — Telephone Encounter (Signed)
Called refill into [facility] left renewal on pharmacy vm...Johny Chess

## 2015-08-27 NOTE — Telephone Encounter (Signed)
Done

## 2015-09-21 ENCOUNTER — Other Ambulatory Visit: Payer: Self-pay | Admitting: Internal Medicine

## 2015-10-14 ENCOUNTER — Other Ambulatory Visit: Payer: Self-pay | Admitting: Family Medicine

## 2015-10-14 ENCOUNTER — Other Ambulatory Visit: Payer: Self-pay | Admitting: Internal Medicine

## 2015-10-15 ENCOUNTER — Other Ambulatory Visit: Payer: Self-pay | Admitting: Internal Medicine

## 2015-10-15 NOTE — Telephone Encounter (Signed)
Called refill into [facility] had to leave on pharmacist vm...Johny Chess

## 2015-10-20 NOTE — Telephone Encounter (Signed)
rx phoned in to [facility]

## 2015-11-03 ENCOUNTER — Ambulatory Visit (INDEPENDENT_AMBULATORY_CARE_PROVIDER_SITE_OTHER): Payer: Medicare Other | Admitting: Internal Medicine

## 2015-11-03 ENCOUNTER — Other Ambulatory Visit (INDEPENDENT_AMBULATORY_CARE_PROVIDER_SITE_OTHER): Payer: Medicare Other

## 2015-11-03 ENCOUNTER — Encounter: Payer: Self-pay | Admitting: Internal Medicine

## 2015-11-03 VITALS — BP 150/90 | HR 62 | Temp 97.9°F | Wt 188.0 lb

## 2015-11-03 DIAGNOSIS — R270 Ataxia, unspecified: Secondary | ICD-10-CM

## 2015-11-03 DIAGNOSIS — E034 Atrophy of thyroid (acquired): Secondary | ICD-10-CM | POA: Diagnosis not present

## 2015-11-03 DIAGNOSIS — R634 Abnormal weight loss: Secondary | ICD-10-CM | POA: Diagnosis not present

## 2015-11-03 DIAGNOSIS — Z23 Encounter for immunization: Secondary | ICD-10-CM

## 2015-11-03 DIAGNOSIS — F0781 Postconcussional syndrome: Secondary | ICD-10-CM

## 2015-11-03 DIAGNOSIS — S12401D Unspecified nondisplaced fracture of fifth cervical vertebra, subsequent encounter for fracture with routine healing: Secondary | ICD-10-CM

## 2015-11-03 DIAGNOSIS — E038 Other specified hypothyroidism: Secondary | ICD-10-CM

## 2015-11-03 DIAGNOSIS — E785 Hyperlipidemia, unspecified: Secondary | ICD-10-CM

## 2015-11-03 DIAGNOSIS — E538 Deficiency of other specified B group vitamins: Secondary | ICD-10-CM | POA: Diagnosis not present

## 2015-11-03 LAB — BASIC METABOLIC PANEL
BUN: 25 mg/dL — AB (ref 6–23)
CALCIUM: 8.9 mg/dL (ref 8.4–10.5)
CO2: 33 mEq/L — ABNORMAL HIGH (ref 19–32)
CREATININE: 1.01 mg/dL (ref 0.40–1.50)
Chloride: 105 mEq/L (ref 96–112)
GFR: 73.99 mL/min (ref >60.00)
GLUCOSE: 102 mg/dL — AB (ref 70–99)
POTASSIUM: 3.9 meq/L (ref 3.5–5.1)
Sodium: 142 mEq/L (ref 135–145)

## 2015-11-03 LAB — T4, FREE: Free T4: 1.1 ng/dL (ref 0.60–1.60)

## 2015-11-03 LAB — TSH: TSH: 2.31 u[IU]/mL (ref 0.35–4.50)

## 2015-11-03 NOTE — Assessment & Plan Note (Signed)
Wt Readings from Last 3 Encounters:  11/03/15 188 lb (85.3 kg)  07/04/15 210 lb (95.3 kg)  06/01/15 202 lb (91.6 kg)

## 2015-11-03 NOTE — Assessment & Plan Note (Signed)
On Levothroid 

## 2015-11-03 NOTE — Assessment & Plan Note (Signed)
On B12 

## 2015-11-03 NOTE — Addendum Note (Signed)
Addended by: Cresenciano Lick on: 11/03/2015 02:02 PM   Modules accepted: Orders
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
## 2015-11-03 NOTE — Assessment & Plan Note (Signed)
  On diet  
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
## 2015-11-03 NOTE — Assessment & Plan Note (Signed)
Toprol and ASA 

## 2015-11-03 NOTE — Assessment & Plan Note (Signed)
Healed per Dr Sherwood Gambler

## 2015-11-03 NOTE — Progress Notes (Signed)
Pre visit review using our clinic review tool, if applicable. No additional management support is needed unless otherwise documented below in the visit note. 

## 2015-11-03 NOTE — Assessment & Plan Note (Signed)
The pt should not drive - he is aware

## 2016-01-05 ENCOUNTER — Ambulatory Visit: Payer: Self-pay

## 2016-01-05 ENCOUNTER — Ambulatory Visit (INDEPENDENT_AMBULATORY_CARE_PROVIDER_SITE_OTHER): Payer: Medicare Other | Admitting: Family Medicine

## 2016-01-05 ENCOUNTER — Encounter: Payer: Self-pay | Admitting: Family Medicine

## 2016-01-05 DIAGNOSIS — G8929 Other chronic pain: Secondary | ICD-10-CM | POA: Diagnosis not present

## 2016-01-05 DIAGNOSIS — M75102 Unspecified rotator cuff tear or rupture of left shoulder, not specified as traumatic: Secondary | ICD-10-CM

## 2016-01-05 DIAGNOSIS — M1711 Unilateral primary osteoarthritis, right knee: Secondary | ICD-10-CM | POA: Insufficient documentation

## 2016-01-05 DIAGNOSIS — M25512 Pain in left shoulder: Secondary | ICD-10-CM

## 2016-01-05 DIAGNOSIS — M12812 Other specific arthropathies, not elsewhere classified, left shoulder: Secondary | ICD-10-CM | POA: Diagnosis not present

## 2016-01-05 NOTE — Patient Instructions (Signed)
Good to see you  Ice is your friend  Stay active  Did the knee and the shoulder today  You know the drill  See me again when you need me Happy holidays!

## 2016-01-05 NOTE — Assessment & Plan Note (Signed)
Patient was given an injection today. Patient's declined custom bracing. Continue to walk with a walker. Given topical anti-inflammatory's. Follow-up again as needed can repeat every 10 weeks if necessary.

## 2016-01-05 NOTE — Progress Notes (Signed)
[facility] [place], [place] [id number] Phone: [phone] Subjective:     CC: Follow-up left shoulder f/u   [id number]  Barry Molina is a 82 y.o. male follow-up for left shoulder pain. Severe arthritis of the left shoulder. Patient has severe arthritic changes of the shoulder.Last injection greater than 6 months ago. Started having worsening pain again. Waking him up at night. States that the pain is so severe that it is stopping him from daily activities. Patient sates when he tries to get up from a seated position to get some some difficult he.   patient is also complaining of right knee pain. Has a past pedicle history significant for severe osteophytic changes. Has seen another provider for this previously. Wondering if he can potentially get an injection of the knee. Has had that done and does seem to help. Has declined bracing in the past. Does have instability and does walk with the aid of a walker.  Past Medical History:  Diagnosis Date  . B12 DEFICIENCY 12/09/2007  . Cataract   . COLONIC POLYPS, HX OF 12/14/2006  . CORONARY ARTERY BYPASS GRAFT, HX OF 08/26/2008  . CORONARY ARTERY DISEASE 12/14/2006  . Edema 08/27/2008  . ELBOW PAIN 10/06/2009  . GERD 12/14/2006  . HYPERLIPIDEMIA 12/14/2006  . HYPERTENSION 12/14/2006  . HYPOTHYROIDISM 12/14/2006  . LEG PAIN 05/10/2010  . Malignant melanoma of skin of upper limb, including shoulder ([place]) 09/07/2008   states he just had places removed;  not cancers  . MUSCLE PAIN 04/10/2007  . NEOPLASM, SKIN, UNCERTAIN BEHAVIOR [id number]  . Other specified acquired hypothyroidism 04/08/2007  . Personal history of other diseases of digestive disease 08/26/2008  . SHOULDER PAIN 12/19/2006   Past Surgical History:  Procedure Laterality Date  . BACK SURGERY  09/27/10  . CHOLECYSTECTOMY  9/12  . CORONARY ARTERY BYPASS GRAFT    . right ankle surgery    . ROTATOR CUFF REPAIR     bilateral  . SPINE  SURGERY  8/12   LS spine cyst   Social History  Substance Use Topics  . Smoking status: Never Smoker  . Smokeless tobacco: Not on file  . Alcohol use No   Allergies  Allergen Reactions  . Ezetimibe-Simvastatin Other (See Comments)     aches  . Morphine Nausea And Vomiting   Family History  Problem Relation Age of Onset  . Heart disease Other   . Hypertension Father   . Cancer Father   . Stroke Mother 18     Past medical history, social, surgical and family history all reviewed in electronic medical record.   Review of Systems: No headache, visual changes, nausea, vomiting, diarrhea, constipation, dizziness, abdominal pain, skin rash, fevers, chills, night sweats, weight loss, swollen lymph nodes, s, chest pain, shortness of breath, mood changes.   Objective  There were no vitals taken for this visit.  Systems examined below as of 01/05/16 General: No apparent distress alert and oriented x3 mood and affect normal, dressed appropriately. Has lost weight since previous exam HEENT: Pupils equal, extraocular movements intact  Respiratory: Patient's speak in full sentences and does not appear short of breath  Cardiovascular: No lower extremity edema, non tender, no erythema  Skin: Warm dry intact with no signs of infection or rash on extremities or on axial skeleton.  Abdomen: Soft nontender  Neuro: Cranial nerves II through XII are intact, neurovascularly intact in all extremities with 2+ DTRs and 2+ pulses.  Lymph: No lymphadenopathy of posterior or anterior cervical chain or axillae bilaterally.  Gait antalgic gait walks with a walker MSK:  Non tender with full range of motion and good stability and symmetric strength and tone of shoulders, elbows, wrist, hip, and ankles bilaterally. Significant osteophytic changes of multiple joints.   Shoulder: left atrophy of the left shoulder girdle musculature Worsening pain over the humerus as well proximally..near the scar area  again. ROM decreased in all planes with crepitus.  Worsening weakness with 3-5 strength Severe impingement signs noted. Speeds and Yergason's tests normal. Normal scapular function observed. Painful arc Worsening No apprehension sign Contralateral shoulder mild crepitus But minimal pain  Right knee exam shows patient does have significant osteophytic changes. Instability noted of the right knee. Especially with valgus force. Significant crepitus. Lacks last 5 of extension and flexion.    Procedure: Real-time Ultrasound Guided Injection of left glenohumeral joint Device: GE Logiq E  Ultrasound guided injection is preferred based studies that show increased duration, increased effect, greater accuracy, decreased procedural pain, increased response rate with ultrasound guided versus blind injection.  Verbal informed consent obtained.  Time-out conducted.  Noted no overlying erythema, induration, or other signs of local infection.  Skin prepped in a sterile fashion.  Local anesthesia: Topical Ethyl chloride.  With sterile technique and under real time ultrasound guidance:  Joint visualized.  23g 1  inch needle inserted posterior approach. Pictures taken for needle placement. Patient did have injection of  2 cc of 0.5% Marcaine, and 1cc uof Kenalog 40 mg/dL. patient did have aspiration of 1 mL of blood removed.  Completed without difficulty  Pain immediately resolved suggesting accurate placement of the medication.  Advised to call if fevers/chills, erythema, induration, drainage, or persistent bleeding.  Images permanently stored and available for review in the ultrasound unit.  Impression: Technically successful ultrasound guided injection.   After informed written and verbal consent, patient was seated on exam table. Right knee was prepped with alcohol swab and utilizing anterolateral approach, patient's right knee space was injected with 4:1  marcaine 0.5%: Kenalog 40mg /dL. Patient  tolerated the procedure well without immediate complications.   Impression and Recommendations:     This case required medical decision making of moderate complexity.

## 2016-01-05 NOTE — Assessment & Plan Note (Addendum)
Worsening symptoms. Patient given another injection. Tolerated the procedure well. Discussed icing regimen and home exercises. Follow-up again as needed.

## 2016-01-06 ENCOUNTER — Other Ambulatory Visit: Payer: Self-pay | Admitting: Family Medicine

## 2016-01-07 NOTE — Telephone Encounter (Signed)
Refill done.  

## 2016-01-31 ENCOUNTER — Ambulatory Visit (INDEPENDENT_AMBULATORY_CARE_PROVIDER_SITE_OTHER): Payer: Medicare Other | Admitting: Internal Medicine

## 2016-01-31 ENCOUNTER — Encounter: Payer: Self-pay | Admitting: Internal Medicine

## 2016-01-31 DIAGNOSIS — E538 Deficiency of other specified B group vitamins: Secondary | ICD-10-CM | POA: Diagnosis not present

## 2016-01-31 DIAGNOSIS — I10 Essential (primary) hypertension: Secondary | ICD-10-CM | POA: Diagnosis not present

## 2016-01-31 DIAGNOSIS — E785 Hyperlipidemia, unspecified: Secondary | ICD-10-CM

## 2016-01-31 DIAGNOSIS — E034 Atrophy of thyroid (acquired): Secondary | ICD-10-CM | POA: Diagnosis not present

## 2016-01-31 DIAGNOSIS — R270 Ataxia, unspecified: Secondary | ICD-10-CM

## 2016-01-31 DIAGNOSIS — I2510 Atherosclerotic heart disease of native coronary artery without angina pectoris: Secondary | ICD-10-CM | POA: Diagnosis not present
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
## 2016-01-31 NOTE — Assessment & Plan Note (Signed)
Toprol, Furosemide 

## 2016-01-31 NOTE — Progress Notes (Signed)
Pre visit review using our clinic review tool, if applicable. No additional management support is needed unless otherwise documented below in the visit note. 

## 2016-01-31 NOTE — Assessment & Plan Note (Signed)
On B12 

## 2016-01-31 NOTE — Assessment & Plan Note (Signed)
Using a walker. No recent falls

## 2016-01-31 NOTE — Assessment & Plan Note (Signed)
Statin intolerant 

## 2016-01-31 NOTE — Assessment & Plan Note (Signed)
Toprol, ASA 

## 2016-01-31 NOTE — Assessment & Plan Note (Signed)
On Levothroid 

## 2016-02-17 ENCOUNTER — Other Ambulatory Visit: Payer: Self-pay | Admitting: Internal Medicine

## 2016-03-30 ENCOUNTER — Other Ambulatory Visit: Payer: Self-pay | Admitting: Family Medicine

## 2016-03-30 NOTE — Telephone Encounter (Signed)
Refill done.  

## 2016-04-02 ENCOUNTER — Observation Stay (HOSPITAL_COMMUNITY): Payer: Medicare Other

## 2016-04-02 ENCOUNTER — Observation Stay (HOSPITAL_BASED_OUTPATIENT_CLINIC_OR_DEPARTMENT_OTHER)
Admission: EM | Admit: 2016-04-02 | Discharge: 2016-04-05 | Disposition: A | Discharge status: Home / Self Care | Payer: Medicare Other | Attending: Internal Medicine | Admitting: Internal Medicine

## 2016-04-02 ENCOUNTER — Emergency Department (HOSPITAL_BASED_OUTPATIENT_CLINIC_OR_DEPARTMENT_OTHER): Payer: Medicare Other

## 2016-04-02 ENCOUNTER — Encounter (HOSPITAL_BASED_OUTPATIENT_CLINIC_OR_DEPARTMENT_OTHER): Payer: Self-pay | Admitting: *Deleted

## 2016-04-02 DIAGNOSIS — Z66 Do not resuscitate: Secondary | ICD-10-CM | POA: Diagnosis not present

## 2016-04-02 DIAGNOSIS — I712 Thoracic aortic aneurysm, without rupture: Secondary | ICD-10-CM | POA: Diagnosis not present

## 2016-04-02 DIAGNOSIS — E785 Hyperlipidemia, unspecified: Secondary | ICD-10-CM | POA: Diagnosis present

## 2016-04-02 DIAGNOSIS — Z885 Allergy status to narcotic agent status: Secondary | ICD-10-CM | POA: Diagnosis not present

## 2016-04-02 DIAGNOSIS — K219 Gastro-esophageal reflux disease without esophagitis: Secondary | ICD-10-CM | POA: Insufficient documentation

## 2016-04-02 DIAGNOSIS — Y9301 Activity, walking, marching and hiking: Secondary | ICD-10-CM | POA: Insufficient documentation

## 2016-04-02 DIAGNOSIS — S82034A Nondisplaced transverse fracture of right patella, initial encounter for closed fracture: Principal | ICD-10-CM | POA: Insufficient documentation

## 2016-04-02 DIAGNOSIS — Z7951 Long term (current) use of inhaled steroids: Secondary | ICD-10-CM | POA: Insufficient documentation

## 2016-04-02 DIAGNOSIS — W010XXA Fall on same level from slipping, tripping and stumbling without subsequent striking against object, initial encounter: Secondary | ICD-10-CM | POA: Diagnosis not present

## 2016-04-02 DIAGNOSIS — Y92481 Parking lot as the place of occurrence of the external cause: Secondary | ICD-10-CM | POA: Diagnosis not present

## 2016-04-02 DIAGNOSIS — E538 Deficiency of other specified B group vitamins: Secondary | ICD-10-CM | POA: Insufficient documentation

## 2016-04-02 DIAGNOSIS — I10 Essential (primary) hypertension: Secondary | ICD-10-CM | POA: Insufficient documentation

## 2016-04-02 DIAGNOSIS — Z7982 Long term (current) use of aspirin: Secondary | ICD-10-CM | POA: Insufficient documentation

## 2016-04-02 DIAGNOSIS — G9340 Encephalopathy, unspecified: Secondary | ICD-10-CM | POA: Insufficient documentation

## 2016-04-02 DIAGNOSIS — K5900 Constipation, unspecified: Secondary | ICD-10-CM | POA: Diagnosis not present

## 2016-04-02 DIAGNOSIS — E039 Hypothyroidism, unspecified: Secondary | ICD-10-CM | POA: Diagnosis not present

## 2016-04-02 DIAGNOSIS — I2510 Atherosclerotic heart disease of native coronary artery without angina pectoris: Secondary | ICD-10-CM | POA: Diagnosis not present

## 2016-04-02 DIAGNOSIS — Z823 Family history of stroke: Secondary | ICD-10-CM | POA: Diagnosis not present

## 2016-04-02 DIAGNOSIS — R9389 Abnormal findings on diagnostic imaging of other specified body structures: Secondary | ICD-10-CM

## 2016-04-02 DIAGNOSIS — R748 Abnormal levels of other serum enzymes: Secondary | ICD-10-CM | POA: Diagnosis not present

## 2016-04-02 DIAGNOSIS — N200 Calculus of kidney: Secondary | ICD-10-CM | POA: Diagnosis not present

## 2016-04-02 DIAGNOSIS — S82009A Unspecified fracture of unspecified patella, initial encounter for closed fracture: Secondary | ICD-10-CM | POA: Diagnosis present

## 2016-04-02 DIAGNOSIS — E876 Hypokalemia: Secondary | ICD-10-CM | POA: Diagnosis not present

## 2016-04-02 DIAGNOSIS — R296 Repeated falls: Secondary | ICD-10-CM | POA: Diagnosis not present

## 2016-04-02 DIAGNOSIS — Z8249 Family history of ischemic heart disease and other diseases of the circulatory system: Secondary | ICD-10-CM | POA: Insufficient documentation

## 2016-04-02 DIAGNOSIS — I517 Cardiomegaly: Secondary | ICD-10-CM | POA: Insufficient documentation

## 2016-04-02 DIAGNOSIS — R7989 Other specified abnormal findings of blood chemistry: Secondary | ICD-10-CM | POA: Insufficient documentation

## 2016-04-02 DIAGNOSIS — Z951 Presence of aortocoronary bypass graft: Secondary | ICD-10-CM | POA: Insufficient documentation

## 2016-04-02 DIAGNOSIS — I700 Atherosclerosis of aorta: Secondary | ICD-10-CM | POA: Diagnosis not present

## 2016-04-02 DIAGNOSIS — R778 Other specified abnormalities of plasma proteins: Secondary | ICD-10-CM

## 2016-04-02 LAB — URINALYSIS, ROUTINE W REFLEX MICROSCOPIC
BILIRUBIN URINE: NEGATIVE
Glucose, UA: NEGATIVE mg/dL
Hgb urine dipstick: NEGATIVE
Ketones, ur: 15 mg/dL — AB
Leukocytes, UA: NEGATIVE
NITRITE: NEGATIVE
PROTEIN: NEGATIVE mg/dL
SPECIFIC GRAVITY, URINE: 1.022 (ref 1.005–1.030)
pH: 7 (ref 5.0–8.0)

## 2016-04-02 LAB — CBC WITH DIFFERENTIAL/PLATELET
BASOS ABS: 0 10*3/uL (ref 0.0–0.1)
Basophils Relative: 0 %
Eosinophils Absolute: 0 10*3/uL (ref 0.0–0.7)
Eosinophils Relative: 0 %
HCT: 42.4 % (ref 39.0–52.0)
Hemoglobin: 14.2 g/dL (ref 13.0–17.0)
LYMPHS PCT: 17 %
Lymphs Abs: 1.8 10*3/uL (ref 0.7–4.0)
MCH: 33.6 pg (ref 26.0–34.0)
MCHC: 33.5 g/dL (ref 30.0–36.0)
MCV: 100.2 fL — AB (ref 78.0–100.0)
Monocytes Absolute: 1.1 10*3/uL — ABNORMAL HIGH (ref 0.1–1.0)
Monocytes Relative: 10 %
NEUTROS ABS: 7.9 10*3/uL — AB (ref 1.7–7.7)
Neutrophils Relative %: 73 %
PLATELETS: 186 10*3/uL (ref 150–400)
RBC: 4.23 MIL/uL (ref 4.22–5.81)
RDW: 13.1 % (ref 11.5–15.5)
WBC: 10.9 10*3/uL — AB (ref 4.0–10.5)

## 2016-04-02 LAB — COMPREHENSIVE METABOLIC PANEL
ALT: 17 U/L (ref 17–63)
ANION GAP: 8 (ref 5–15)
AST: 46 U/L — ABNORMAL HIGH (ref 15–41)
Albumin: 3.8 g/dL (ref 3.5–5.0)
Alkaline Phosphatase: 56 U/L (ref 38–126)
BILIRUBIN TOTAL: 2.6 mg/dL — AB (ref 0.3–1.2)
BUN: 21 mg/dL — ABNORMAL HIGH (ref 6–20)
CO2: 27 mmol/L (ref 22–32)
Calcium: 9.1 mg/dL (ref 8.9–10.3)
Chloride: 106 mmol/L (ref 101–111)
Creatinine, Ser: 0.91 mg/dL (ref 0.61–1.24)
Glucose, Bld: 108 mg/dL — ABNORMAL HIGH (ref 65–99)
POTASSIUM: 3.7 mmol/L (ref 3.5–5.1)
Sodium: 141 mmol/L (ref 135–145)
TOTAL PROTEIN: 6.8 g/dL (ref 6.5–8.1)

## 2016-04-02 LAB — VITAMIN B12: Vitamin B-12: 768 pg/mL (ref 180–914)

## 2016-04-02 LAB — CK: CK TOTAL: 622 U/L — AB (ref 49–397)

## 2016-04-02 LAB — TROPONIN I
Troponin I: 0.43 ng/mL (ref <0.03)
Troponin I: 0.4 ng/mL (ref <0.03)

## 2016-04-02 MED ORDER — VITAMIN B-12 1000 MCG PO TABS
1000.0000 ug | ORAL_TABLET | Freq: Every day | ORAL | Status: DC
  Administered 2016-04-03 – 2016-04-04 (×2): 1000 ug via ORAL
  Filled 2016-04-02 (×3): qty 1

## 2016-04-02 MED ORDER — HYDROMORPHONE HCL 1 MG/ML IJ SOLN
1.0000 mg | Freq: Once | INTRAMUSCULAR | Status: AC
  Administered 2016-04-02: 1 mg via INTRAVENOUS
  Filled 2016-04-02: qty 1

## 2016-04-02 MED ORDER — ENOXAPARIN SODIUM 40 MG/0.4ML ~~LOC~~ SOLN
40.0000 mg | SUBCUTANEOUS | Status: DC
  Filled 2016-04-02: qty 0.4

## 2016-04-02 MED ORDER — FUROSEMIDE 20 MG PO TABS: 20.0000 mg | ORAL_TABLET | Freq: Two times a day (BID) | ORAL | Status: DC | PRN

## 2016-04-02 MED ORDER — VITAMIN D 1000 UNITS PO TABS
1000.0000 [IU] | ORAL_TABLET | Freq: Every day | ORAL | Status: DC
  Administered 2016-04-03 – 2016-04-05 (×3): 1000 [IU] via ORAL
  Filled 2016-04-02 (×4): qty 1

## 2016-04-02 MED ORDER — LINACLOTIDE 145 MCG PO CAPS: 145.0000 ug | ORAL_CAPSULE | ORAL | Status: DC

## 2016-04-02 MED ORDER — SODIUM CHLORIDE 0.9 % IV SOLN
INTRAVENOUS | Status: DC
  Administered 2016-04-02: 18:00:00 via INTRAVENOUS

## 2016-04-02 MED ORDER — TRAMADOL HCL 50 MG PO TABS
50.0000 mg | ORAL_TABLET | Freq: Two times a day (BID) | ORAL | Status: DC | PRN
  Administered 2016-04-03 – 2016-04-04 (×2): 50 mg via ORAL
  Filled 2016-04-02 (×3): qty 1

## 2016-04-02 MED ORDER — FUROSEMIDE 20 MG PO TABS
20.0000 mg | ORAL_TABLET | Freq: Every day | ORAL | Status: DC
  Administered 2016-04-03 – 2016-04-04 (×2): 20 mg via ORAL
  Filled 2016-04-02 (×3): qty 1

## 2016-04-02 MED ORDER — METOPROLOL SUCCINATE ER 25 MG PO TB24
12.5000 mg | ORAL_TABLET | Freq: Every day | ORAL | Status: DC
  Administered 2016-04-02 – 2016-04-05 (×4): 12.5 mg via ORAL
  Filled 2016-04-02 (×4): qty 1

## 2016-04-02 MED ORDER — ACETAMINOPHEN 325 MG PO TABS
650.0000 mg | ORAL_TABLET | Freq: Four times a day (QID) | ORAL | Status: DC | PRN
  Administered 2016-04-02: 650 mg via ORAL
  Filled 2016-04-02: qty 2

## 2016-04-02 MED ORDER — LEVOTHYROXINE SODIUM 100 MCG PO TABS
125.0000 ug | ORAL_TABLET | Freq: Every day | ORAL | Status: DC
  Administered 2016-04-03 – 2016-04-05 (×3): 125 ug via ORAL
  Filled 2016-04-02 (×3): qty 1

## 2016-04-02 MED ORDER — ACETAMINOPHEN 650 MG RE SUPP: 650.0000 mg | Freq: Four times a day (QID) | RECTAL | Status: DC | PRN

## 2016-04-02 MED ORDER — BISACODYL 10 MG RE SUPP: 10.0000 mg | Freq: Every day | RECTAL | Status: DC | PRN

## 2016-04-02 MED ORDER — VITAMIN D (ERGOCALCIFEROL) 1.25 MG (50000 UNIT) PO CAPS: 50000.0000 [IU] | ORAL_CAPSULE | ORAL | Status: DC

## 2016-04-02 MED ORDER — TRAZODONE HCL 50 MG PO TABS
25.0000 mg | ORAL_TABLET | Freq: Every evening | ORAL | Status: DC | PRN
  Administered 2016-04-03: 25 mg via ORAL
  Filled 2016-04-02: qty 1

## 2016-04-02 MED ORDER — FLUTICASONE PROPIONATE 50 MCG/ACT NA SUSP
1.0000 | Freq: Every day | NASAL | Status: DC
  Administered 2016-04-04: 1 via NASAL
  Filled 2016-04-02: qty 16

## 2016-04-02 MED ORDER — ASPIRIN 325 MG PO TABS: 325.0000 mg | ORAL_TABLET | Freq: Every day | ORAL | Status: DC

## 2016-04-02 MED ORDER — PANTOPRAZOLE SODIUM 40 MG PO TBEC
40.0000 mg | DELAYED_RELEASE_TABLET | Freq: Every day | ORAL | Status: DC
  Administered 2016-04-02 – 2016-04-05 (×4): 40 mg via ORAL
  Filled 2016-04-02 (×4): qty 1
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
## 2016-04-02 NOTE — ED Notes (Signed)
Received call from lab.  Critical Troponin 0.43.  Results given to nurse and EDP.

## 2016-04-02 NOTE — Plan of Care (Signed)
83 year old male with past mental history significant for skin cancer, hypothyroidism, high blood pressure, high cholesterol, coronary artery disease, CABG who presented to the emergency room with knee pain. Patient stated he was walked in the parking lot and fell to his knee and have been limping ever since. Last time he was so well and normal is unknown but family came in today and found him down on the floor in his house. Patient was brought to the emergency room for evaluation. Emergency room vital signs were stable. Examination revealed a swollen knee. Evaluation done for altered mental status reveal elevated troponin of 0.43. EP consult did orthopedics for patellar fracture. They asked that hospitalists admit patient may will consult.  EDP will get follow-up imaging to look at tibial plateau and UA.  Patient will come to a telemetry bed for observation. Will likely need repeat troponin and EKG on arrival.  Elwin Mocha MD

## 2016-04-02 NOTE — ED Notes (Signed)
Attempted to call report, but RN was unavailable to take report at this time. Left call back number.

## 2016-04-02 NOTE — ED Notes (Signed)
ED Provider at bedside. 

## 2016-04-02 NOTE — ED Provider Notes (Signed)
[facility] Provider Note   CSN: [id number] Arrival date & time: 04/02/16  1054     History   Chief Complaint Chief Complaint  Patient presents with  . Knee Injury    HPI Barry Molina is a 83 y.o. male.  Patient is an 83 year old male with history of hypertension, hypothyroidism who presents for evaluation of right knee pain. He apparently tripped and fell walking to a gas station parking lot and landed directly on his right knee. Since that time he has become increasingly swollen and painful, and he has been unable to ambulate. He lives at home with visiting nursing care several hours per day, however he is otherwise independent and home alone. He denies any other injury.  According to the daughters at bedside, he fell again at home and was found on the floor. His daughters feel as though he has been more confused and disoriented over the past several days.   The history is provided by the patient.    Past Medical History:  Diagnosis Date  . B12 DEFICIENCY 12/09/2007  . Cataract   . COLONIC POLYPS, HX OF 12/14/2006  . CORONARY ARTERY BYPASS GRAFT, HX OF 08/26/2008  . CORONARY ARTERY DISEASE 12/14/2006  . Edema 08/27/2008  . ELBOW PAIN 10/06/2009  . GERD 12/14/2006  . HYPERLIPIDEMIA 12/14/2006  . HYPERTENSION 12/14/2006  . HYPOTHYROIDISM 12/14/2006  . LEG PAIN 05/10/2010  . Malignant melanoma of skin of upper limb, including shoulder ([place]) 09/07/2008   states he just had places removed;  not cancers  . MUSCLE PAIN 04/10/2007  . NEOPLASM, SKIN, UNCERTAIN BEHAVIOR [id number]  . Other specified acquired hypothyroidism 04/08/2007  . Personal history of other diseases of digestive disease 08/26/2008  . SHOULDER PAIN 12/19/2006    Patient Active Problem List   Diagnosis Date Noted  . Arthritis of right knee 01/05/2016  . Acute confusional state 07/12/2015  . Headache 07/12/2015  . Post-concussion syndrome 07/12/2015  . Fracture of cervical vertebra, C5 ([place])  07/12/2015  . Constipation 06/01/2015  . Acute encephalopathy 04/06/2015  . Well adult exam 03/31/2015  . Left rotator cuff tear arthropathy 04/23/2014  . Trigger index finger of right hand 01/07/2014  . Contusion of face 12/06/2012  . Sinusitis 12/06/2012  . Right knee pain 11/13/2012  . Ataxia 08/08/2012  . Neuropathy of both feet 08/08/2012  . Fall at home 10/23/2011  . Weight loss, abnormal 11/16/2010  . LEG PAIN 05/10/2010  . ELBOW PAIN 10/06/2009  . Malignant melanoma of skin of upper limb, including shoulder ([place]) 09/07/2008  . Edema 08/27/2008  . Personal history of other diseases of digestive system 08/26/2008  . CORONARY ARTERY BYPASS GRAFT, HX OF 08/26/2008  . NEOPLASM, SKIN, UNCERTAIN BEHAVIOR [id number]  . B12 deficiency 12/09/2007  . MUSCLE PAIN 04/10/2007  . SHOULDER PAIN 12/19/2006  . Hypothyroidism 12/14/2006  . Dyslipidemia 12/14/2006  . Essential hypertension 12/14/2006  . Coronary atherosclerosis 12/14/2006  . GERD 12/14/2006  . COLONIC POLYPS, HX OF 12/14/2006    Past Surgical History:  Procedure Laterality Date  . BACK SURGERY  09/27/10  . CHOLECYSTECTOMY  9/12  . CORONARY ARTERY BYPASS GRAFT    . right ankle surgery    . ROTATOR CUFF REPAIR     bilateral  . SPINE SURGERY  8/12   LS spine cyst       Home Medications    Prior to Admission medications   Medication Sig Start Date End Date Taking? Authorizing Provider  aspirin 325 MG  tablet Take 325 mg by mouth daily.     Yes Historical Provider, MD  Cholecalciferol (VITAMIN D3) 1000 UNITS CAPS Take 1,000 Units by mouth daily.    Yes Historical Provider, MD  cyanocobalamin 1000 MCG tablet Take 1,000 mcg by mouth daily.   Yes Historical Provider, MD  fluticasone (FLONASE) 50 MCG/ACT nasal spray USE 1 SPRAY IN EACH NOSTRIL DAILY AS NEEDED Patient taking differently: USE 1 SPRAY IN EACH NOSTRIL DAILY AS NEEDED FOR ALLERGIES 02/14/12  Yes Aleksei Plotnikov V, MD  furosemide (LASIX) 20 MG tablet TAKE  ONE TO TWO TABLETS BY MOUTH ONCE DAILY AS NEEDED FOR  EDEMA 07/27/15  Yes Aleksei Plotnikov V, MD  levothyroxine (SYNTHROID, LEVOTHROID) 125 MCG tablet Take 1 tablet (125 mcg total) by mouth daily. 05/03/15  Yes Aleksei Plotnikov V, MD  Linaclotide (LINZESS) 145 MCG CAPS capsule Take 1 capsule (145 mcg total) by mouth 1 day or 1 dose. 06/01/15  Yes Aleksei Plotnikov V, MD  metoprolol succinate (TOPROL-XL) 25 MG 24 hr tablet TAKE ONE-HALF TABLET BY MOUTH ONCE DAILY 09/21/15  Yes Aleksei Plotnikov V, MD  omeprazole (PRILOSEC) 40 MG capsule TAKE ONE CAPSULE BY MOUTH ONCE DAILY 08/09/15  Yes Aleksei Plotnikov V, MD  traMADol (ULTRAM) 50 MG tablet TAKE ONE TO TWO TABLETS BY MOUTH EVERY 12 HOURS AS NEEDED FOR SEVERE PAIN 02/18/16  Yes Cassandria Anger, MD  Vitamin D, Ergocalciferol, (DRISDOL) 50000 units CAPS capsule TAKE ONE CAPSULE BY MOUTH ONCE A WEEK 03/30/16  Yes Lyndal Pulley, DO    Family History Family History  Problem Relation Age of Onset  . Hypertension Father   . Cancer Father   . Stroke Mother 28  . Heart disease Other     Social History Social History  Substance Use Topics  . Smoking status: Never Smoker  . Smokeless tobacco: Never Used  . Alcohol use No     Allergies   Ezetimibe-simvastatin and Morphine   Review of Systems Review of Systems  All other systems reviewed and are negative.    Physical Exam Updated Vital Signs BP 140/70 (BP Location: Right Arm)   Pulse 74   Temp 98.3 F (36.8 C) (Oral)   Resp 16   Ht 5\' 11"  (1.803 m)   Wt 180 lb (81.6 kg)   SpO2 97%   BMI 25.10 kg/m   Physical Exam  Constitutional: He is oriented to person, place, and time. He appears well-developed and well-nourished. No distress.  HENT:  Head: Normocephalic and atraumatic.  Mouth/Throat: Oropharynx is clear and moist.  Neck: Normal range of motion. Neck supple.  Cardiovascular: Normal rate and regular rhythm.  Exam reveals no friction rub.   No murmur  heard. Pulmonary/Chest: Effort normal and breath sounds normal. No respiratory distress. He has no wheezes. He has no rales.  Abdominal: Soft. Bowel sounds are normal. He exhibits no distension. There is no tenderness.  Musculoskeletal: Normal range of motion. He exhibits no edema.  The right knee appears swollen with a large effusion. There is also slight ecchymosis to the patella. Distal PMS is intact. Exam is limited otherwise secondary to pain.  Neurological: He is alert and oriented to person, place, and time. Coordination normal.  Skin: Skin is warm and dry. He is not diaphoretic.  Nursing note and vitals reviewed.    ED Treatments / Results  Labs (all labs ordered are listed, but only abnormal results are displayed) Labs Reviewed  COMPREHENSIVE METABOLIC PANEL  CBC WITH DIFFERENTIAL/PLATELET  TROPONIN  I  CK  URINALYSIS, ROUTINE W REFLEX MICROSCOPIC    EKG  EKG Interpretation  Date/Time:  Sunday April 02 2016 11:20:13 EST Ventricular Rate:  76 PR Interval:    QRS Duration: 162 QT Interval:  438 QTC Calculation: 493 R Axis:   41 Text Interpretation:  Sinus rhythm Right bundle branch block Confirmed by Daje Stark  MD, Kylene Zamarron ([id number]) on 04/02/2016 3:46:21 PM       Radiology No results found.  Procedures Procedures (including critical care time)  Medications Ordered in ED Medications - No data to display   Initial Impression / Assessment and Plan / ED Course  I have reviewed the triage vital signs and the nursing notes.  Pertinent labs & imaging results that were available during my care of the patient were reviewed by me and considered in my medical decision making (see chart for details).  Patient is an 83 year old male brought by family for evaluation of a right knee injury. The patient was walking across a gas station when he tripped and fell and struck his knee on the concrete. He required assistance back into the car and has not ambulated since. He has swelling  to the knee and x-rays show a transverse patella fracture that is nondisplaced. I've discussed this finding with Dr. Veverly Fells from orthopedics who does not believe this to be surgical. He recommends a knee immobilizer and orthopedic consultation non-emergently.  Family also reports to me that he has been less mobile, weaker, and more confused recently. Medical workup was initiated revealing an elevated troponin, however no other obvious abnormalities. Due to the patient's extreme limitation with gait, elevated troponin, and knee fracture, he will be admitted to the hospitalist service under the care of Dr. Aggie Moats. He will likely require cardiology consultation, orthopedic consultation, and physical therapy.  Final Clinical Impressions(s) / ED Diagnoses   Final diagnoses:  None    New Prescriptions New Prescriptions   No medications on file     Veryl Speak, MD 04/02/16 [phone]

## 2016-04-02 NOTE — Progress Notes (Signed)
CRITICAL VALUE ALERT  Critical value received:  Troponin .40  Date of notification:  04/02/2016  Time of notification:  1940  Critical value read back:Yes.    Nurse who received alert:  J.Mckena Chern  MD notified (1st page):  K.Schoor  Time of first page:  1940  MD notified (2nd page):  Time of second page:  Responding MD:  N/a   Time MD responded:  N/a

## 2016-04-02 NOTE — ED Triage Notes (Signed)
Pt reports he tripped this past Friday and fell directly on R knee on concrete. States no eval until today and that he's unable to put weight on R leg. Denies numbness/tingling.

## 2016-04-03 DIAGNOSIS — G9340 Encephalopathy, unspecified: Secondary | ICD-10-CM | POA: Diagnosis not present

## 2016-04-03 DIAGNOSIS — I10 Essential (primary) hypertension: Secondary | ICD-10-CM | POA: Diagnosis not present

## 2016-04-03 DIAGNOSIS — E538 Deficiency of other specified B group vitamins: Secondary | ICD-10-CM | POA: Diagnosis not present

## 2016-04-03 DIAGNOSIS — R748 Abnormal levels of other serum enzymes: Secondary | ICD-10-CM

## 2016-04-03 DIAGNOSIS — E785 Hyperlipidemia, unspecified: Secondary | ICD-10-CM | POA: Diagnosis not present

## 2016-04-03 LAB — BASIC METABOLIC PANEL
Anion gap: 9 (ref 5–15)
BUN: 19 mg/dL (ref 6–20)
CALCIUM: 8.6 mg/dL — AB (ref 8.9–10.3)
CO2: 25 mmol/L (ref 22–32)
CREATININE: 0.95 mg/dL (ref 0.61–1.24)
Chloride: 107 mmol/L (ref 101–111)
GFR calc Af Amer: >60 mL/min (ref >60)
GFR calc non Af Amer: >60 mL/min (ref >60)
GLUCOSE: 93 mg/dL (ref 65–99)
Potassium: 3.5 mmol/L (ref 3.5–5.1)
Sodium: 141 mmol/L (ref 135–145)

## 2016-04-03 LAB — CK: Total CK: 296 U/L (ref 49–397)

## 2016-04-03 LAB — FOLATE RBC
Folate, RBC: >1527 ng/mL (ref >498)
Hematocrit: 40.6 % (ref 37.5–51.0)

## 2016-04-03 LAB — CBC
HCT: 39.5 % (ref 39.0–52.0)
Hemoglobin: 13.1 g/dL (ref 13.0–17.0)
MCH: 32.9 pg (ref 26.0–34.0)
MCHC: 33.2 g/dL (ref 30.0–36.0)
MCV: 99.2 fL (ref 78.0–100.0)
PLATELETS: 182 10*3/uL (ref 150–400)
RBC: 3.98 MIL/uL — ABNORMAL LOW (ref 4.22–5.81)
RDW: 13.2 % (ref 11.5–15.5)
WBC: 10 10*3/uL (ref 4.0–10.5)

## 2016-04-03 LAB — TROPONIN I: TROPONIN I: 0.37 ng/mL — AB (ref <0.03)

## 2016-04-03 NOTE — Progress Notes (Signed)
Pt currently confused at this time and continues to remove IV that has been placed. MD notified. Orders placed. Will continue to monitor. Alysia Penna

## 2016-04-03 NOTE — Progress Notes (Signed)
PROGRESS NOTE    Barry Molina  [id number] DOB: 02-21-1926 DOA: 04/02/2016 PCP: Walker Kehr, MD   Outpatient Specialists:     Brief Narrative:  Barry Molina is a 83 y.o. male with medical history significant for attention, hyperlipidemia, GERD, hypothyroidism, CAD status post CABG, B-12 deficiency, presents to [facility] [address] from [facility] with the chief complaint of elevated troponin and acute encephalopathy. Initial evaluation reveals troponin 0.43. Recent admitted to rule out and workup acute encephalopathy  Information is pain from the patient and the daughter who is at the bedside noting that information from patient may be unreliable secondary to acute encephalopathy setting of most likely early dementia. Patient reports 2 days ago he experienced a mechanical fall the gas station. States he "tripped" and fell onto his knee. He had difficulty getting up but was assisted in drove himself home. He reports pain and swelling to that right knee he applied ice with some relief. Daughter reports that yesterday afternoon she went to "check on him" and found him on the floor. She's not sure how long he was down there. He again reports he suffered a mechanical fall and doesn't feel like he was down there "very long". But he did not attempt to call for assistance. Family had difficulty managing patient as he was complaining of worsening pain to that right knee and was unable to bear weight. Associated symptoms include generalized weakness and increased confusion. no Reports of any dysuria hematuria frequency or urgency. No reports of nausea vomiting diarrhea constipation. Patient denies any headache visual disturbances dizziness syncope or near-syncope. He denies a chest pain palpitations shortness of breath lower extremity edema or orthopnea.    Assessment & Plan:   Principal Problem:   Elevated troponin Active Problems:   Hypothyroidism   B12 deficiency   Dyslipidemia  Essential hypertension   CORONARY ARTERY BYPASS GRAFT, HX OF   Acute encephalopathy   Constipation   Patella fracture   Elevated troponin/elevated CK. History of CAD status post bypass graft in 2010. Heart score 4. Patient fell 2 over the last 2 days. Most recent fall yesterday at home likely related to injured right knee. unclear how long he was down. No loss of consciousness reported.  -cardiology consult for elevated troponin and known CAD  Acute encephalopathy. Likely related to mild dehydration. Suspect patient's baseline is early dementia as he is unable to manage his meds and/or appointments. Has family close by and outside help 5 hours a day. He is oriented to person and place only. Denies any loss of consciousness with his recent falls. No metabolic derangement. Neuro exam benign. No signs symptoms of infection. -gentle iv fluids -hold altering medication -B12 normal   Patella fracture. X-ray as noted above. Knee immobilizer  in place -Ortho also consult requested  Hypertension. Controlled. -home medications as noted above     DVT prophylaxis:  SCD's  Code Status: DNR   Family Communication: No family at bedside  Disposition Plan:  PT Eval in AM   Consultants:   Ortho  cards    Subjective: No current chest pain  Objective: Vitals:   04/02/16 1527 04/03/16 0443 04/03/16 0500 04/03/16 0954  BP: 137/70 136/79  121/69  Pulse: 84 78  79  Resp: 18 16    Temp: 99.5 F (37.5 C) 98 F (36.7 C)    TempSrc: Oral Oral    SpO2: 95% 99%    Weight: 83.3 kg (183 lb 10.3 oz)  83.5  kg (184 lb 1.4 oz)   Height: 5\' 11"  (1.803 m)       Intake/Output Summary (Last 24 hours) at 04/03/16 1212 Last data filed at 04/03/16 [id number]  Gross per 24 hour  Intake              480 ml  Output              850 ml  Net             -370 ml   Filed Weights   04/02/16 1111 04/02/16 1527 04/03/16 0500  Weight: 81.6 kg (180 lb) 83.3 kg (183 lb 10.3 oz) 83.5 kg (184 lb 1.4  oz)    Examination:  General exam: Appears calm and comfortable  Respiratory system: Clear to auscultation. Respiratory effort normal. Cardiovascular system: S1 & S2 heard, RRR. No JVD, murmurs, rubs, gallops or clicks. No pedal edema. Gastrointestinal system: Abdomen is nondistended, soft and nontender. No organomegaly or masses felt. Normal bowel sounds heard. Central nervous system: Alert    Data Reviewed: I have personally reviewed following labs and imaging studies  CBC:  Recent Labs Lab 04/02/16 1150 04/03/16 0303  WBC 10.9* 10.0  NEUTROABS 7.9*  --   HGB 14.2 13.1  HCT 42.4 39.5  MCV 100.2* 99.2  PLT 186 [id number]   Basic Metabolic Panel:  Recent Labs Lab 04/02/16 1150 04/03/16 0303  NA 141 141  K 3.7 3.5  CL 106 107  CO2 27 25  GLUCOSE 108* 93  BUN 21* 19  CREATININE 0.91 0.95  CALCIUM 9.1 8.6*   GFR: Estimated Creatinine Clearance: 56.1 mL/min (by C-G formula based on SCr of 0.95 mg/dL). Liver Function Tests:  Recent Labs Lab 04/02/16 1150  AST 46*  ALT 17  ALKPHOS 56  BILITOT 2.6*  PROT 6.8  ALBUMIN 3.8   No results for input(s): LIPASE, AMYLASE in the last 168 hours. No results for input(s): AMMONIA in the last 168 hours. Coagulation Profile: No results for input(s): INR, PROTIME in the last 168 hours. Cardiac Enzymes:  Recent Labs Lab 04/02/16 1150 04/02/16 1829 04/03/16 0303  CKTOTAL 622*  --  296  TROPONINI 0.43* 0.40* 0.37*   BNP (last 3 results) No results for input(s): PROBNP in the last 8760 hours. HbA1C: No results for input(s): HGBA1C in the last 72 hours. CBG: No results for input(s): GLUCAP in the last 168 hours. Lipid Profile: No results for input(s): CHOL, HDL, LDLCALC, TRIG, CHOLHDL, LDLDIRECT in the last 72 hours. Thyroid Function Tests: No results for input(s): TSH, T4TOTAL, FREET4, T3FREE, THYROIDAB in the last 72 hours. Anemia Panel:  Recent Labs  04/02/16 1829  VITAMINB12 768   Urine analysis:      Component Value Date/Time   COLORURINE AMBER (A) 04/02/2016 1403   APPEARANCEUR CLEAR 04/02/2016 1403   LABSPEC 1.022 04/02/2016 1403   PHURINE 7.0 04/02/2016 1403   GLUCOSEU NEGATIVE 04/02/2016 [place] 07/12/2015 1200   HGBUR NEGATIVE 04/02/2016 1403   BILIRUBINUR NEGATIVE 04/02/2016 1403   KETONESUR 15 (A) 04/02/2016 1403   PROTEINUR NEGATIVE 04/02/2016 1403   UROBILINOGEN 1.0 07/12/2015 1200   NITRITE NEGATIVE 04/02/2016 1403   LEUKOCYTESUR NEGATIVE 04/02/2016 1403     )No results found for this or any previous visit (from the past 240 hour(s)).    Anti-infectives    None       Radiology Studies: Ct Chest Wo Contrast  Result Date: 04/03/2016 CLINICAL DATA:  Shortness of breath. Follow-up abnormal  chest radiograph. EXAM: CT CHEST WITHOUT CONTRAST TECHNIQUE: Multidetector CT imaging of the chest was performed following the standard protocol without IV contrast. COMPARISON:  Chest radiograph April 02, 2016 at 1614 hours FINDINGS: Cardiovascular: The heart is mildly enlarged. Status post CABG. Severe coronary artery calcifications/ stents. No pericardial effusions. Ascending aorta is 4.8 cm in transaxial dimension. Moderate calcific atherosclerosis of the aortic arch. Mediastinum/Nodes: No mediastinal lymphadenopathy, limited by noncontrast CT. Normal appearance of the thoracic esophagus the not tailored for evaluation. Lungs/Pleura: Slight RIGHT apical fibronodular scarring. Minimal scarring lingula. LEFT lower lobe atelectasis and elevated LEFT hemidiaphragm. Trace RIGHT middle lobe atelectasis. No pleural effusion or focal consolidation. Tracheobronchial tree is patent and midline. No pneumothorax. No RIGHT middle lobe mass, chest radiograph findings reflect RIGHT hilar vessels. Upper Abdomen: Nonobstructing 4 mm LEFT nephrolithiasis. Calcified granuloma LEFT lobe of the liver. Musculoskeletal: Subcentimeter coarse calcification LEFT thyroid. Severe degenerative  change of LEFT shoulder, mild with multiple surgical staples. Severe degenerative change of the RIGHT shoulder, with suture anchor. High-riding RIGHT humeral head. Bridging ventral thoracic spine osteophytes. IMPRESSION: Atelectasis. No acute pulmonary process. No RIGHT middle lobe mass or consolidation, chest radiograph findings correspond to RIGHT hilar vessels. Mild cardiomegaly . **An incidental finding of potential clinical significance has been found. 4.7 cm ascending aortic aneurysm. Recommend semi-annual imaging followup by CTA or MRA and referral to cardiothoracic surgery if not already obtained. This recommendation follows 2010 ACCF/AHA/AATS/ACR/ASA/SCA/SCAI/SIR/STS/SVM Guidelines for the Diagnosis and Management of Patients With Thoracic Aortic Disease. Circulation. 2010; [id number]** Electronically Signed   By: Elon Alas M.D.   On: 04/03/2016 00:03   Dg Chest Port 1 View  Result Date: 04/02/2016 CLINICAL DATA:  Encephalopathy acute. Pt has no chest complaints at this time. Hx CAD, HTN, nonsmoker. EXAM: PORTABLE CHEST 1 VIEW COMPARISON:  04/06/2015 FINDINGS: Status post median sternotomy and CABG. Heart is enlarged. There is elevation of left hemidiaphragm which is stable. Shallow lung inflation and slight patient rotation. There is retrocardiac density on the right. Although this may represent hilar vessels accentuated by the shallow lung inflation infiltrate or mass for difficult to exclude. There is prominence of interstitial markings at the base likely indicating mild interstitial edema. IMPRESSION: 1. Shallow inflation and patient rotation. 2. Question of mass or infiltrate in the medial right lung base. Further evaluation is warranted. Consider PA and lateral chest x-ray if the patient is able. Alternatively, chest CT may be needed. Electronically Signed   By: Nolon Nations M.D.   On: 04/02/2016 16:40   Dg Knee Complete 4 Views Right  Result Date: 04/02/2016 CLINICAL DATA:  Pain  after fall. EXAM: RIGHT KNEE - COMPLETE 4+ VIEW COMPARISON:  None. FINDINGS: Patellar fracture. Large joint effusion. Linear interface between the effusion and overlying fat is favored to be due to something on the patient as the linearity extends inferior to the knee. No convincing evidence of tibial plateau fracture. No other fractures. IMPRESSION: 1. Patellar fracture. 2. Large joint effusion. The linear interface between the effusion and adjacent fat is thought to be artifactual as described above. No visualized tibial plateau fracture. If there is high concern for tibial plateau fracture, a repeat lateral view could be obtained without anything on the patient. Electronically Signed   By: Dorise Bullion III M.D   On: 04/02/2016 12:17        Scheduled Meds: . cholecalciferol  1,000 Units Oral Daily  . fluticasone  1 spray Each Nare Daily  . furosemide  20 mg Oral  Daily  . levothyroxine  125 mcg Oral QAC breakfast  . metoprolol succinate  12.5 mg Oral Daily  . pantoprazole  40 mg Oral Daily  . cyanocobalamin  1,000 mcg Oral Daily   Continuous Infusions:   LOS: 1 day    Time spent: 25 min    [place], DO [facility] Pager [phone]  If 7PM-7AM, please contact night-coverage [email or web address] Password [email or web address] 04/03/2016, 12:12 PM

## 2016-04-03 NOTE — Progress Notes (Signed)
Pt currently confused and trying to get out of bed. Pt moved from 2w13 to 2w12 to be placed in a camera room to be monitored. Bed alarm checked and activated. Will continue to monitor. Isac Caddy, RN

## 2016-04-03 NOTE — Consult Note (Signed)
Date: 04/03/2016               Patient Name:  Barry Molina MRN: [id number]  DOB: [birth year] Age / Sex: 83 y.o., male   PCP: Cassandria Anger, MD         Requesting Physician: Dr. Geradine Girt, DO    Consulting Reason:  Elevated troponin      Chief Complaint:  History of Present Illness: 83 yo M with a PMHx of skin cancer, hypothyroidism, HTN, HLD, CAD s/p remote CABG came into [facility] on 04/02/16 for evaluation of R knee pain, increased confusion, and generalized weakness. Patient had 2 recent falls and was found down on the floor by family (unclear how long he was on the floor).  He was found to have a non-displaced transverse patellar fracture and an elevated troponin of 0.43. Patient denies having any CP, SOB, palpitations, or diaphoresis. Denies any history of angina or DOE. Denies having any orthopnea or LE edema. States he lives independently, drives on his own, and is able to do chores such as grocery shopping without any problems. Reports having a mechanical fall at a gas station a week ago and then another fall at home a few days ago. Reports having pain and swelling in his R knee. No other complaints.  In the ED, patient was afberile and hemodynamically stable. Found to have an elevated troponin of 0.43 w/o any acute ischemic changes on EKG. CK 622.   Meds: Current Facility-Administered Medications  Medication Dose Route Frequency Provider Last Rate Last Dose  . acetaminophen (TYLENOL) tablet 650 mg  650 mg Oral Q6H PRN Radene Gunning, NP   650 mg at 04/02/16 1748   Or  . acetaminophen (TYLENOL) suppository 650 mg  650 mg Rectal Q6H PRN Radene Gunning, NP      . bisacodyl (DULCOLAX) suppository 10 mg  10 mg Rectal Daily PRN Radene Gunning, NP      . cholecalciferol (VITAMIN D) tablet 1,000 Units  1,000 Units Oral Daily Radene Gunning, NP   1,000 Units at 04/03/16 [phone]  . fluticasone (FLONASE) 50 MCG/ACT nasal spray 1 spray  1 spray Each Nare Daily Lezlie Octave Black, NP      . furosemide  (LASIX) tablet 20 mg  20 mg Oral Daily Radene Gunning, NP   20 mg at 04/03/16 0954  . levothyroxine (SYNTHROID, LEVOTHROID) tablet 125 mcg  125 mcg Oral QAC breakfast Radene Gunning, NP   125 mcg at 04/03/16 0608  . metoprolol succinate (TOPROL-XL) 24 hr tablet 12.5 mg  12.5 mg Oral Daily Lezlie Octave Black, NP   12.5 mg at 04/03/16 0955  . pantoprazole (PROTONIX) EC tablet 40 mg  40 mg Oral Daily Radene Gunning, NP   40 mg at 04/03/16 0954  . traMADol (ULTRAM) tablet 50 mg  50 mg Oral Q12H PRN Radene Gunning, NP   50 mg at 04/03/16 0033  . traZODone (DESYREL) tablet 25 mg  25 mg Oral QHS PRN Radene Gunning, NP   25 mg at 04/03/16 0033  . vitamin B-12 (CYANOCOBALAMIN) tablet 1,000 mcg  1,000 mcg Oral Daily Radene Gunning, NP   1,000 mcg at 04/03/16 [id number]    Allergies: Allergies as of 04/02/2016 - Review Complete 04/02/2016  Allergen Reaction Noted  . Ezetimibe-simvastatin Other (See Comments) 12/09/2007  . Morphine Nausea And Vomiting    Past Medical History:  Diagnosis Date  . B12 DEFICIENCY 12/09/2007  .  Cataract   . COLONIC POLYPS, HX OF 12/14/2006  . CORONARY ARTERY BYPASS GRAFT, HX OF 08/26/2008  . CORONARY ARTERY DISEASE 12/14/2006  . Edema 08/27/2008  . ELBOW PAIN 10/06/2009  . GERD 12/14/2006  . HYPERLIPIDEMIA 12/14/2006  . HYPERTENSION 12/14/2006  . HYPOTHYROIDISM 12/14/2006  . LEG PAIN 05/10/2010  . 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 MUSCLE PAIN 04/10/2007  . NEOPLASM, SKIN, UNCERTAIN BEHAVIOR [id number]  . Other specified acquired hypothyroidism 04/08/2007  . Personal history of other diseases of digestive disease 08/26/2008  . SHOULDER PAIN 12/19/2006   Past Surgical History:  Procedure Laterality Date  . BACK SURGERY  09/27/10  . CHOLECYSTECTOMY  9/12  . CORONARY ARTERY BYPASS GRAFT    . right ankle surgery    . ROTATOR CUFF REPAIR     bilateral  . SPINE SURGERY  8/12   LS spine cyst    Family History  Problem Relation Age of Onset  . Hypertension Father   . Cancer Father   . Stroke Mother 2  . Heart disease Other    Social History   Social History  . Marital status: Widowed    Spouse name: N/A  . Number of children: N/A  . Years of education: N/A   Occupational History  . Not on file.   Social History Main Topics  . Smoking status: Never Smoker  . Smokeless tobacco: Never Used  . Alcohol use No  . Drug use: No  . Sexual activity: Not Currently   Other Topics Concern  . Not on file   Social History Narrative   Lost wife in Dec;   Living alone;    Children are close by; 2 dtrs live within walking distance   One level home      Stay there as long as he can    Review of Systems: Pertinent positives mentioned in HPI. Remainder of all ROS negative.   Physical Exam: Blood pressure 127/74, pulse 88, temperature 98.3 F (36.8 C), temperature source Oral, resp. rate 18, height 5\' 11"  (1.803 m), weight 83.5 kg (184 lb 1.4 oz), SpO2 95 %. Physical Exam  Constitutional: He appears well-developed and well-nourished. No distress.  Elderly caucasian male lying comfortably in a hospital bed watching the television.   HENT:  Head: Normocephalic and atraumatic.  Eyes: EOM are normal.  Cardiovascular: Normal rate, regular rhythm and intact distal pulses.  Exam reveals no gallop and no friction rub.   Murmur heard. Grade 2/6 systolic ejection murmur best appreciated at the RUSB. No pedal edema.   Pulmonary/Chest: Effort normal and breath sounds normal. No respiratory distress. He has no wheezes. He has no rales.  Abdominal: Soft. Bowel sounds are normal. He exhibits no distension. There is no tenderness.  Musculoskeletal:  R knee: immobilizer in place  Neurological:  Alert, awake, oriented to person and place only.   Skin: Skin is warm and dry.    Lab results: Basic Metabolic Panel:  Recent Labs  04/02/16 1150 04/03/16 0303  NA 141 141  K 3.7  3.5  CL 106 107  CO2 27 25  GLUCOSE 108* 93  BUN 21* 19  CREATININE 0.91 0.95  CALCIUM 9.1 8.6*   Liver Function Tests:  Recent Labs  04/02/16 1150  AST 46*  ALT 17  ALKPHOS 56  BILITOT 2.6*  PROT 6.8  ALBUMIN 3.8   CBC:  Recent Labs  04/02/16 1150 04/03/16 0303  WBC 10.9* 10.0  NEUTROABS 7.9*  --   HGB 14.2 13.1  HCT 42.4 39.5  MCV 100.2* 99.2  PLT 186 182   Cardiac Enzymes:  Recent Labs  04/02/16 1150 04/02/16 1829 04/03/16 0303  CKTOTAL 622*  --  296  TROPONINI 0.43* 0.40* 0.37*   Anemia Panel:  Recent Labs  04/02/16 1829  VITAMINB12 768   Urinalysis:  Recent Labs  04/02/16 1403  COLORURINE AMBER*  LABSPEC 1.022  PHURINE 7.0  GLUCOSEU NEGATIVE  HGBUR NEGATIVE  BILIRUBINUR NEGATIVE  KETONESUR 15*  PROTEINUR NEGATIVE  NITRITE NEGATIVE  LEUKOCYTESUR NEGATIVE   Imaging results:  Ct Chest Wo Contrast  Result Date: 04/03/2016 CLINICAL DATA:  Shortness of breath. Follow-up abnormal chest radiograph. EXAM: CT CHEST WITHOUT CONTRAST TECHNIQUE: Multidetector CT imaging of the chest was performed following the standard protocol without IV contrast. COMPARISON:  Chest radiograph April 02, 2016 at 1614 hours FINDINGS: Cardiovascular: The heart is mildly enlarged. Status post CABG. Severe coronary artery calcifications/ stents. No pericardial effusions. Ascending aorta is 4.8 cm in transaxial dimension. Moderate calcific atherosclerosis of the aortic arch. Mediastinum/Nodes: No mediastinal lymphadenopathy, limited by noncontrast CT. Normal appearance of the thoracic esophagus the not tailored for evaluation. Lungs/Pleura: Slight RIGHT apical fibronodular scarring. Minimal scarring lingula. LEFT lower lobe atelectasis and elevated LEFT hemidiaphragm. Trace RIGHT middle lobe atelectasis. No pleural effusion or focal consolidation. Tracheobronchial tree is patent and midline. No pneumothorax. No RIGHT middle lobe mass, chest radiograph findings reflect RIGHT  hilar vessels. Upper Abdomen: Nonobstructing 4 mm LEFT nephrolithiasis. Calcified granuloma LEFT lobe of the liver. Musculoskeletal: Subcentimeter coarse calcification LEFT thyroid. Severe degenerative change of LEFT shoulder, mild with multiple surgical staples. Severe degenerative change of the RIGHT shoulder, with suture anchor. High-riding RIGHT humeral head. Bridging ventral thoracic spine osteophytes. IMPRESSION: Atelectasis. No acute pulmonary process. No RIGHT middle lobe mass or consolidation, chest radiograph findings correspond to RIGHT hilar vessels. Mild cardiomegaly . **An incidental finding of potential clinical significance has been found. 4.7 cm ascending aortic aneurysm. Recommend semi-annual imaging followup by CTA or MRA and referral to cardiothoracic surgery if not already obtained. This recommendation follows 2010 ACCF/AHA/AATS/ACR/ASA/SCA/SCAI/SIR/STS/SVM Guidelines for the Diagnosis and Management of Patients With Thoracic Aortic Disease. Circulation. 2010; [id number]** Electronically Signed   By: Elon Alas M.D.   On: 04/03/2016 00:03   Dg Chest Port 1 View  Result Date: 04/02/2016 CLINICAL DATA:  Encephalopathy acute. Pt has no chest complaints at this time. Hx CAD, HTN, nonsmoker. EXAM: PORTABLE CHEST 1 VIEW COMPARISON:  04/06/2015 FINDINGS: Status post median sternotomy and CABG. Heart is enlarged. There is elevation of left hemidiaphragm which is stable. Shallow lung inflation and slight patient rotation. There is retrocardiac density on the right. Although this may represent hilar vessels accentuated by the shallow lung inflation infiltrate or mass for difficult to exclude. There is prominence of interstitial markings at the base likely indicating mild interstitial edema. IMPRESSION: 1. Shallow inflation and patient rotation. 2. Question of mass or infiltrate in the medial right lung base. Further evaluation is warranted. Consider PA and lateral chest x-ray if the patient  is able. Alternatively, chest CT may be needed. Electronically Signed   By: Nolon Nations M.D.   On: 04/02/2016 16:40   Dg Knee Complete 4 Views Right  Result Date: 04/02/2016 CLINICAL DATA:  Pain after fall. EXAM: RIGHT KNEE - COMPLETE 4+ VIEW COMPARISON:  None. FINDINGS: Patellar fracture. Large joint effusion. Linear interface between the effusion  and overlying fat is favored to be due to something on the patient as the linearity extends inferior to the knee. No convincing evidence of tibial plateau fracture. No other fractures. IMPRESSION: 1. Patellar fracture. 2. Large joint effusion. The linear interface between the effusion and adjacent fat is thought to be artifactual as described above. No visualized tibial plateau fracture. If there is high concern for tibial plateau fracture, a repeat lateral view could be obtained without anything on the patient. Electronically Signed   By: Dorise Bullion III M.D   On: 04/02/2016 12:17    Other results: EKG: No acute ischemic changes.  Assessment, Plan, & Recommendations by Problem: Principal Problem:   Elevated troponin Active Problems:   Hypothyroidism   B12 deficiency   Dyslipidemia   Essential hypertension   CORONARY ARTERY BYPASS GRAFT, HX OF   Acute encephalopathy   Constipation   Patella fracture  Elevated troponin: Patient has a history of CAD s/p CABG in 2002. Heart score 4. Troponins 0.43> 0.40> 0.37. EKG w/o any acute ischemic changes. He is not complaining of CP. Elevated troponin likely due to demand ischemia in the setting of recent falls and possible hypotension/ arrhythmia. However, acute coronary syndrome cannot be completely ruled out considering troponin elevation. Patient is likely not a good candidate for invasive workup in the setting of advanced age and baseline dementia.   -Will order echo to assess LV function   -Recommend monitoring the patient on telemetry for another 24 hrs -Repeat EKG in am -Will hold off  checking any more troponins  -Hold Aspirin due to increased bleeding risk in the setting of recent falls  HTN: Stable.  -Continue home medication metoprolol  HLD: Not on statin therapy due to history of intolerance. -Will hold off checking a lipid panel considering patient's advanced age and inability to tolerate statins.   Aortic aneurysm: A 4.7 cm ascending aortic aneurysm identified on chest CT.  -Will need a follow-up chest CT in 6 months  Acute encephalopathy: Likely related to mild dehydration and a component of baseline dementia. No metabolic derangements. B12 normal. Neuro exam benign.  -Folate and RPR pending   Patellar fracture -Knee immobilizer -Mgmt per primary team and ortho   Elevated CK: Likely in the setting of recent falls and patient being found down on the floor at home. CK trending down. -CTM  Signed: Shela Leff, MD 04/03/2016, 2:11 PM   I have personally seen and examined this patient with Dr. Marlowe Sax. I agree with the assessment and plan as outlined above. Mr. Gentili is known to have CAD with prior CABG in 2002. He has been seen in our office by Dr. Johnsie Cancel. Now admitted after a fall. He fell recently at a gas station and hurt his knee. His family then found him down on the floor in his home. He denies chest pain or dyspnea. Troponin mildly elevated with flat trend. EKG without ischemic changes.  My exam shows a WDWN male in NAD. He is confused today. He just pulled out his IV. He dos not recall having had CABG in the past.  Plan: 83 yo male with dementia, no chest pain, no EKG changes with mildly elevated troponin following ? Period of time down at home. He is not a candidate for invasive evaluation. Will arrange echo to assess LV function. Would stop checking troponin. We will follow with you.   Lauree Chandler 04/03/2016 3:58 PM

## 2016-04-03 NOTE — Progress Notes (Signed)
PT Cancellation Note  Patient Details Name: Barry Molina MRN: [id number] DOB: March 26, 1925   Cancelled Treatment:    Reason Eval/Treat Not Completed: Patient not medically ready. Pt with elevated troponin at .43 and awaiting ortho consult for patella fracture. Acute PT to return as able when appropriate to complete PT eval.   Sherman Donaldson M Joelle Flessner 04/03/2016, 1:14 PM   Kittie Plater, PT, DPT Pager #: [phone] Office #: [phone]

## 2016-04-03 NOTE — Consult Note (Addendum)
Reason for Consult:Right knee patella fracture Referring Physician: Eliseo Squires MD  Barry Molina is an 83 y.o. male.  HPI: 83 yo male who had an unwitnessed fall several days ago and presented to [facility] with knee pain. XRAYs there noted and nondisplaced patella fracture.  Patient admitted for medical workup.  Family noted that the patient had very poor mobility after the fall. No other ortho complaints other than generalized weakness.  Past Medical History:  Diagnosis Date  . B12 DEFICIENCY 12/09/2007  . Cataract   . COLONIC POLYPS, HX OF 12/14/2006  . CORONARY ARTERY BYPASS GRAFT, HX OF 08/26/2008  . CORONARY ARTERY DISEASE 12/14/2006  . Edema 08/27/2008  . ELBOW PAIN 10/06/2009  . GERD 12/14/2006  . HYPERLIPIDEMIA 12/14/2006  . HYPERTENSION 12/14/2006  . HYPOTHYROIDISM 12/14/2006  . LEG PAIN 05/10/2010  . Malignant melanoma of skin of upper limb, including shoulder ([place]) 09/07/2008   states he just had places removed;  not cancers  . MUSCLE PAIN 04/10/2007  . NEOPLASM, SKIN, UNCERTAIN BEHAVIOR 05/23/6145  . Other specified acquired hypothyroidism 04/08/2007  . Personal history of other diseases of digestive disease 08/26/2008  . SHOULDER PAIN 12/19/2006    Past Surgical History:  Procedure Laterality Date  . BACK SURGERY  09/27/10  . CHOLECYSTECTOMY  9/12  . CORONARY ARTERY BYPASS GRAFT    . right ankle surgery    . ROTATOR CUFF REPAIR     bilateral  . SPINE SURGERY  8/12   LS spine cyst    Family History  Problem Relation Age of Onset  . Hypertension Father   . Cancer Father   . Stroke Mother 23  . Heart disease Other     Social History:  reports that he has never smoked. He has never used smokeless tobacco. He reports that he does not drink alcohol or use drugs.  Allergies:  Allergies  Allergen Reactions  . Ezetimibe-Simvastatin Other (See Comments)     aches  . Morphine Nausea And Vomiting    Medications: I have reviewed the patient's current  medications.  Results for orders placed or performed during the hospital encounter of 04/02/16 (from the past 48 hour(s))  Comprehensive metabolic panel     Status: Abnormal   Collection Time: 04/02/16 11:50 AM  Result Value Ref Range   Sodium 141 135 - 145 mmol/L   Potassium 3.7 3.5 - 5.1 mmol/L   Chloride 106 101 - 111 mmol/L   CO2 27 22 - 32 mmol/L   Glucose, Bld 108 (H) 65 - 99 mg/dL   BUN 21 (H) 6 - 20 mg/dL   Creatinine, Ser 0.91 0.61 - 1.24 mg/dL   Calcium 9.1 8.9 - 10.3 mg/dL   Total Protein 6.8 6.5 - 8.1 g/dL   Albumin 3.8 3.5 - 5.0 g/dL   AST 46 (H) 15 - 41 U/L   ALT 17 17 - 63 U/L   Alkaline Phosphatase 56 38 - 126 U/L   Total Bilirubin 2.6 (H) 0.3 - 1.2 mg/dL   GFR calc non Af Amer >60 >60 mL/min   GFR calc Af Amer >60 >60 mL/min    Comment: (NOTE) The eGFR has been calculated using the CKD EPI equation. This calculation has not been validated in all clinical situations. eGFR's persistently <60 mL/min signify possible Chronic Kidney Disease.    Anion gap 8 5 - 15  CBC with Differential     Status: Abnormal   Collection Time: 04/02/16 11:50 AM  Result Value  Ref Range   WBC 10.9 (H) 4.0 - 10.5 K/uL   RBC 4.23 4.22 - 5.81 MIL/uL   Hemoglobin 14.2 13.0 - 17.0 g/dL   HCT 42.4 39.0 - 52.0 %   MCV 100.2 (H) 78.0 - 100.0 fL   MCH 33.6 26.0 - 34.0 pg   MCHC 33.5 30.0 - 36.0 g/dL   RDW 13.1 11.5 - 15.5 %   Platelets 186 150 - 400 K/uL   Neutrophils Relative % 73 %   Neutro Abs 7.9 (H) 1.7 - 7.7 K/uL   Lymphocytes Relative 17 %   Lymphs Abs 1.8 0.7 - 4.0 K/uL   Monocytes Relative 10 %   Monocytes Absolute 1.1 (H) 0.1 - 1.0 K/uL   Eosinophils Relative 0 %   Eosinophils Absolute 0.0 0.0 - 0.7 K/uL   Basophils Relative 0 %   Basophils Absolute 0.0 0.0 - 0.1 K/uL  Troponin I     Status: Abnormal   Collection Time: 04/02/16 11:50 AM  Result Value Ref Range   Troponin I 0.43 (HH) <0.03 ng/mL    Comment: CRITICAL RESULT CALLED TO, READ BACK BY AND VERIFIED  WITH: TERRI MASTEN,RN ON 10/01/44 AT 2703 BY ALICE GALLIMORE,MLT   CK     Status: Abnormal   Collection Time: 04/02/16 11:50 AM  Result Value Ref Range   Total CK 622 (H) 49 - 397 U/L  Urinalysis, Routine w reflex microscopic     Status: Abnormal   Collection Time: 04/02/16  2:03 PM  Result Value Ref Range   Color, Urine AMBER (A) YELLOW    Comment: BIOCHEMICALS MAY BE AFFECTED BY COLOR   APPearance CLEAR CLEAR   Specific Gravity, Urine 1.022 1.005 - 1.030   pH 7.0 5.0 - 8.0   Glucose, UA NEGATIVE NEGATIVE mg/dL   Hgb urine dipstick NEGATIVE NEGATIVE   Bilirubin Urine NEGATIVE NEGATIVE   Ketones, ur 15 (A) NEGATIVE mg/dL   Protein, ur NEGATIVE NEGATIVE mg/dL   Nitrite NEGATIVE NEGATIVE   Leukocytes, UA NEGATIVE NEGATIVE    Comment: Microscopic not done on urines with negative protein, blood, leukocytes, nitrite, or glucose < 500 mg/dL.  Folate RBC     Status: None   Collection Time: 04/02/16  3:42 PM  Result Value Ref Range   Folate, Hemolysate >620.0 Not Estab. ng/mL   Hematocrit 40.6 37.5 - 51.0 %   Folate, RBC >1,527 >498 ng/mL    Comment: (NOTE) Performed At: [facility] [address] [place], [place] [id number] Lindon Romp MD XH:[id number]   Troponin I     Status: Abnormal   Collection Time: 04/02/16  6:29 PM  Result Value Ref Range   Troponin I 0.40 (HH) <0.03 ng/mL    Comment: CRITICAL RESULT CALLED TO, READ BACK BY AND VERIFIED WITH: JADJI CRITICAL RESULT CALLED TO, READ BACK BY AND VERIFIED WITH: R.JADJI,RN 04/02/16 @1936  BY V.WILKINS   Vitamin B12     Status: None   Collection Time: 04/02/16  6:29 PM  Result Value Ref Range   Vitamin B-12 768 180 - 914 pg/mL    Comment: (NOTE) This assay is not validated for testing neonatal or myeloproliferative syndrome specimens for Vitamin B12 levels.   Basic metabolic panel     Status: Abnormal   Collection Time: 04/03/16  3:03 AM  Result Value Ref Range   Sodium 141 135 - 145 mmol/L   Potassium 3.5  3.5 - 5.1 mmol/L   Chloride 107 101 - 111 mmol/L   CO2 25  22 - 32 mmol/L   Glucose, Bld 93 65 - 99 mg/dL   BUN 19 6 - 20 mg/dL   Creatinine, Ser 0.95 0.61 - 1.24 mg/dL   Calcium 8.6 (L) 8.9 - 10.3 mg/dL   GFR calc non Af Amer >60 >60 mL/min   GFR calc Af Amer >60 >60 mL/min    Comment: (NOTE) The eGFR has been calculated using the CKD EPI equation. This calculation has not been validated in all clinical situations. eGFR's persistently <60 mL/min signify possible Chronic Kidney Disease.    Anion gap 9 5 - 15  CBC     Status: Abnormal   Collection Time: 04/03/16  3:03 AM  Result Value Ref Range   WBC 10.0 4.0 - 10.5 K/uL   RBC 3.98 (L) 4.22 - 5.81 MIL/uL   Hemoglobin 13.1 13.0 - 17.0 g/dL   HCT 39.5 39.0 - 52.0 %   MCV 99.2 78.0 - 100.0 fL   MCH 32.9 26.0 - 34.0 pg   MCHC 33.2 30.0 - 36.0 g/dL   RDW 13.2 11.5 - 15.5 %   Platelets 182 150 - 400 K/uL  Troponin I     Status: Abnormal   Collection Time: 04/03/16  3:03 AM  Result Value Ref Range   Troponin I 0.37 (HH) <0.03 ng/mL    Comment: CRITICAL VALUE NOTED.  VALUE IS CONSISTENT WITH PREVIOUSLY REPORTED AND CALLED VALUE.  CK     Status: None   Collection Time: 04/03/16  3:03 AM  Result Value Ref Range   Total CK 296 49 - 397 U/L    Ct Chest Wo Contrast  Result Date: 04/03/2016 CLINICAL DATA:  Shortness of breath. Follow-up abnormal chest radiograph. EXAM: CT CHEST WITHOUT CONTRAST TECHNIQUE: Multidetector CT imaging of the chest was performed following the standard protocol without IV contrast. COMPARISON:  Chest radiograph April 02, 2016 at 1614 hours FINDINGS: Cardiovascular: The heart is mildly enlarged. Status post CABG. Severe coronary artery calcifications/ stents. No pericardial effusions. Ascending aorta is 4.8 cm in transaxial dimension. Moderate calcific atherosclerosis of the aortic arch. Mediastinum/Nodes: No mediastinal lymphadenopathy, limited by noncontrast CT. Normal appearance of the thoracic esophagus the not  tailored for evaluation. Lungs/Pleura: Slight RIGHT apical fibronodular scarring. Minimal scarring lingula. LEFT lower lobe atelectasis and elevated LEFT hemidiaphragm. Trace RIGHT middle lobe atelectasis. No pleural effusion or focal consolidation. Tracheobronchial tree is patent and midline. No pneumothorax. No RIGHT middle lobe mass, chest radiograph findings reflect RIGHT hilar vessels. Upper Abdomen: Nonobstructing 4 mm LEFT nephrolithiasis. Calcified granuloma LEFT lobe of the liver. Musculoskeletal: Subcentimeter coarse calcification LEFT thyroid. Severe degenerative change of LEFT shoulder, mild with multiple surgical staples. Severe degenerative change of the RIGHT shoulder, with suture anchor. High-riding RIGHT humeral head. Bridging ventral thoracic spine osteophytes. IMPRESSION: Atelectasis. No acute pulmonary process. No RIGHT middle lobe mass or consolidation, chest radiograph findings correspond to RIGHT hilar vessels. Mild cardiomegaly . **An incidental finding of potential clinical significance has been found. 4.7 cm ascending aortic aneurysm. Recommend semi-annual imaging followup by CTA or MRA and referral to cardiothoracic surgery if not already obtained. This recommendation follows 2010 ACCF/AHA/AATS/ACR/ASA/SCA/SCAI/SIR/STS/SVM Guidelines for the Diagnosis and Management of Patients With Thoracic Aortic Disease. Circulation. 2010; 121: N053-Z767** Electronically Signed   By: Elon Alas M.D.   On: 04/03/2016 00:03   Dg Chest Port 1 View  Result Date: 04/02/2016 CLINICAL DATA:  Encephalopathy acute. Pt has no chest complaints at this time. Hx CAD, HTN, nonsmoker. EXAM: PORTABLE CHEST 1 VIEW COMPARISON:  04/06/2015 FINDINGS: Status post median sternotomy and CABG. Heart is enlarged. There is elevation of left hemidiaphragm which is stable. Shallow lung inflation and slight patient rotation. There is retrocardiac density on the right. Although this may represent hilar vessels accentuated  by the shallow lung inflation infiltrate or mass for difficult to exclude. There is prominence of interstitial markings at the base likely indicating mild interstitial edema. IMPRESSION: 1. Shallow inflation and patient rotation. 2. Question of mass or infiltrate in the medial right lung base. Further evaluation is warranted. Consider PA and lateral chest x-ray if the patient is able. Alternatively, chest CT may be needed. Electronically Signed   By: Nolon Nations M.D.   On: 04/02/2016 16:40   Dg Knee Complete 4 Views Right  Result Date: 04/02/2016 CLINICAL DATA:  Pain after fall. EXAM: RIGHT KNEE - COMPLETE 4+ VIEW COMPARISON:  None. FINDINGS: Patellar fracture. Large joint effusion. Linear interface between the effusion and overlying fat is favored to be due to something on the patient as the linearity extends inferior to the knee. No convincing evidence of tibial plateau fracture. No other fractures. IMPRESSION: 1. Patellar fracture. 2. Large joint effusion. The linear interface between the effusion and adjacent fat is thought to be artifactual as described above. No visualized tibial plateau fracture. If there is high concern for tibial plateau fracture, a repeat lateral view could be obtained without anything on the patient. Electronically Signed   By: Dorise Bullion III M.D   On: 04/02/2016 12:17    ROS Blood pressure 127/74, pulse 88, temperature 98.3 F (36.8 C), temperature source Oral, resp. rate 18, height 5' 11"  (1.803 m), weight 83.5 kg (184 lb 1.4 oz), SpO2 95 %. Physical Exam Awake and alert, normal cervical AROM, bilateral UEs with normal AROM no pain Right LE with swollen and tender knee, skin intact, no cords, limited knee AROM due to pain, no calf swelling and no pain with active ankle pumps L LE with pain free AROM, NVI distally  Assessment/Plan: Nondisplaced right patella fracture s/p fall.  Plan non-surgical treatment for this injury.  I do not see evidence for a tibial  plateau fracture.  Plan knee immobilizer and WBAT on the R LE with therapy.   No ROM of the knee for 4-6 weeks.  Will likely need SNF for adequate care and extended rehab. Currently mechanical only.  Consider Lovenox or other option for prophylaxis.  Will need DVT prophylaxis for 6 weeks as well  Follow up with me in the office for xrays in two weeks. Ashley 04/03/2016, 5:15 PM

## 2016-04-04 ENCOUNTER — Observation Stay (HOSPITAL_BASED_OUTPATIENT_CLINIC_OR_DEPARTMENT_OTHER): Payer: Medicare Other

## 2016-04-04 DIAGNOSIS — E538 Deficiency of other specified B group vitamins: Secondary | ICD-10-CM | POA: Diagnosis not present

## 2016-04-04 DIAGNOSIS — E785 Hyperlipidemia, unspecified: Secondary | ICD-10-CM

## 2016-04-04 DIAGNOSIS — R7989 Other specified abnormal findings of blood chemistry: Secondary | ICD-10-CM | POA: Diagnosis not present

## 2016-04-04 DIAGNOSIS — E039 Hypothyroidism, unspecified: Secondary | ICD-10-CM | POA: Diagnosis not present

## 2016-04-04 DIAGNOSIS — S82034A Nondisplaced transverse fracture of right patella, initial encounter for closed fracture: Secondary | ICD-10-CM | POA: Diagnosis not present

## 2016-04-04 DIAGNOSIS — R748 Abnormal levels of other serum enzymes: Secondary | ICD-10-CM | POA: Diagnosis not present

## 2016-04-04 DIAGNOSIS — I10 Essential (primary) hypertension: Secondary | ICD-10-CM | POA: Diagnosis not present

## 2016-04-04 LAB — CBC
HCT: 41.4 % (ref 39.0–52.0)
HEMOGLOBIN: 13.9 g/dL (ref 13.0–17.0)
MCH: 33.1 pg (ref 26.0–34.0)
MCHC: 33.6 g/dL (ref 30.0–36.0)
MCV: 98.6 fL (ref 78.0–100.0)
Platelets: 198 10*3/uL (ref 150–400)
RBC: 4.2 MIL/uL — AB (ref 4.22–5.81)
RDW: 13 % (ref 11.5–15.5)
WBC: 8.8 10*3/uL (ref 4.0–10.5)

## 2016-04-04 LAB — RPR: RPR: REACTIVE — AB

## 2016-04-04 LAB — BASIC METABOLIC PANEL
ANION GAP: 8 (ref 5–15)
BUN: 21 mg/dL — AB (ref 6–20)
CO2: 26 mmol/L (ref 22–32)
Calcium: 8.6 mg/dL — ABNORMAL LOW (ref 8.9–10.3)
Chloride: 104 mmol/L (ref 101–111)
Creatinine, Ser: 0.98 mg/dL (ref 0.61–1.24)
GFR calc Af Amer: >60 mL/min (ref >60)
GFR calc non Af Amer: >60 mL/min (ref >60)
Glucose, Bld: 111 mg/dL — ABNORMAL HIGH (ref 65–99)
POTASSIUM: 3.3 mmol/L — AB (ref 3.5–5.1)
SODIUM: 138 mmol/L (ref 135–145)

## 2016-04-04 LAB — ECHOCARDIOGRAM COMPLETE
HEIGHTINCHES: 71 in
Weight: 3047.64 oz

## 2016-04-04 LAB — RPR, QUANT+TP ABS (REFLEX): TREPONEMA PALLIDUM AB: NEGATIVE

## 2016-04-04 MED ORDER — POTASSIUM CHLORIDE CRYS ER 20 MEQ PO TBCR
40.0000 meq | EXTENDED_RELEASE_TABLET | Freq: Once | ORAL | Status: AC
  Administered 2016-04-04: 40 meq via ORAL
  Filled 2016-04-04: qty 2

## 2016-04-04 MED ORDER — TRAZODONE HCL 50 MG PO TABS: 25.0000 mg | ORAL_TABLET | Freq: Every evening | ORAL | Status: DC | PRN

## 2016-04-04 MED ORDER — ENOXAPARIN SODIUM 40 MG/0.4ML ~~LOC~~ SOLN
40.0000 mg | SUBCUTANEOUS | Status: DC
  Administered 2016-04-04 – 2016-04-05 (×2): 40 mg via SUBCUTANEOUS
  Filled 2016-04-04 (×2): qty 0.4

## 2016-04-04 NOTE — Progress Notes (Signed)
Clinical Social Worker spoke to admissions coordinator Barry Molina from [place] to make her aware of [facility]'s 6 hour back up.[facility] employee stated that they would be able to pick up patient around 11:30pm-12:00 am.  Barry Molina stated that patein has to be in the SNF facility by midnight for insurance purpose. CSW contacted Director Barry Molina to try and arrange other means of transport to facility but was unable to find other means. CSW made patients attending aware of situation. Attending stated that if patient does not have safe transport, patient can leave first thing in the morning. CSW contacted family and left voicemail on phone to discuss situation with family. CSW contacted after hour [facility]( spoke to Barry Molina) to cancel transport since Barry Molina would be unable to get to patient at a decent hour. Barry Molina stated that they are unable to find patient on list but will make employee aware that patient should not be picked up. CSW remains available for support and discharge needs.  Barry Molina, MSW,  [facility]

## 2016-04-04 NOTE — Evaluation (Signed)
Physical Therapy Evaluation Patient Details Name: Barry Molina MRN: [id number] DOB: 1926-02-16 Today's Date: 04/04/2016   History of Present Illness  Barry Weider Sileris a 83 y.o.malewith medical history significant for attention, hyperlipidemia, GERD, hypothyroidism, CAD status post CABG, B-12 deficiency, presents to [address] from [place] with the chief complaint of elevated troponin and acute encephalopathy. Initial evaluation reveals troponin 0.43.  Pt also with 2 mechanical falls this week and noted R patellar fracture. Pt in [place] and WBAT on R LE with no ROM to R knee.  Clinical Impression  Pt admitted with above. Pt with confusion and functional deficits as listed below. Pt unsafe to return home alone as pt at increased falls risk, decreased safety awareness, and requiring modA for all mobility. Recommend ST-SNF upon d/c to address mentioned deficits and achieve maximal functional recovery.    Follow Up Recommendations SNF;Supervision/Assistance - 24 hour    Equipment Recommendations  None recommended by PT    Recommendations for Other Services       Precautions / Restrictions Precautions Precautions: Fall Precaution Comments: confusion Required Braces or Orthoses: Knee Immobilizer - Right Knee Immobilizer - Right: On at all times (no ROM to knee3) Restrictions Weight Bearing Restrictions: Yes RLE Weight Bearing: Weight bearing as tolerated (with KI on)      Mobility  Bed Mobility Overal bed mobility: Needs Assistance Bed Mobility: Supine to Sit     Supine to sit: Mod assist;HOB elevated     General bed mobility comments: max directional v/c's, able to move R and L LE to EOB with directional cues. modA for trunk elevation to get to EOB  Transfers Overall transfer level: Needs assistance Equipment used: Rolling walker (2 wheeled) Transfers: Sit to/from Stand Sit to Stand: Mod assist;+2 physical assistance         General transfer comment: modAx2  for initial power up due to R LE in extension. minAx2 to maintain standing and situated in walker  Ambulation/Gait Ambulation/Gait assistance: Mod assist (2nd person helpful for longer distance for chair follow) Ambulation Distance (Feet): 25 Feet Assistive device: Rolling walker (2 wheeled) Gait Pattern/deviations: Step-to pattern;Trunk flexed Gait velocity: slow Gait velocity interpretation: Below normal speed for age/gender General Gait Details: pt with decreased step height bilaterally, modA for walker mangement, max directional v/c's to stay in walker and not push to far out in front of self  Stairs            Wheelchair Mobility    Modified Rankin (Stroke Patients Only)       Balance Overall balance assessment: Needs assistance Sitting-balance support: Feet supported;No upper extremity supported Sitting balance-Leahy Scale: Fair     Standing balance support: Bilateral upper extremity supported Standing balance-Leahy Scale: Poor Standing balance comment: requires use of RW                              Pertinent Vitals/Pain Pain Assessment: Faces Faces Pain Scale: Hurts a little bit Pain Location: R knee Pain Descriptors / Indicators: Sore Pain Intervention(s): Monitored during session    Home Living Family/patient expects to be discharged to:: Skilled nursing facility                 Additional Comments: pt lived alone PTA in 1 story home with 3 steps to enter    Prior Function Level of Independence: Independent         Comments: was driving, used cane and/or  walker but has had falls recently     Hand Dominance   Dominant Hand: Right    Extremity/Trunk Assessment   Upper Extremity Assessment Upper Extremity Assessment: RUE deficits/detail;LUE deficits/detail RUE Deficits / Details: grossly 3+/5, shoulder flexion actively to 90 deg LUE Deficits / Details: grossly 3-/5, shoulder flex 45 deg    Lower Extremity Assessment Lower  Extremity Assessment: RLE deficits/detail;LLE deficits/detail RLE Deficits / Details: R KI, ankle and hip fair, no ROM to knee LLE Deficits / Details: grossly 4/5    Cervical / Trunk Assessment Cervical / Trunk Assessment: Kyphotic  Communication   Communication: No difficulties  Cognition Arousal/Alertness: Awake/alert Behavior During Therapy: Flat affect Overall Cognitive Status: History of cognitive impairments - at baseline                 General Comments: noted beginning stages of dementa. pt oriented to hospital and 2/6, however not year, pt stated his birth year and current year were 1957, pt with delayed processing and difficulty sequencing, followed 1 step commands with increased time    General Comments      Exercises     Assessment/Plan    PT Assessment Patient needs continued PT services  PT Problem List Decreased strength;Decreased activity tolerance;Decreased balance;Decreased mobility;Decreased coordination;Decreased cognition;Decreased knowledge of use of DME;Decreased safety awareness;Decreased knowledge of precautions          PT Treatment Interventions DME instruction;Gait training;Stair training;Functional mobility training;Therapeutic activities;Therapeutic exercise;Balance training    PT Goals (Current goals can be found in the Care Plan section)  Acute Rehab PT Goals Patient Stated Goal: didn't state PT Goal Formulation: With patient Time For Goal Achievement: 04/18/16 Potential to Achieve Goals: Good    Frequency Min 3X/week   Barriers to discharge Decreased caregiver support lives alone    Co-evaluation               End of Session Equipment Utilized During Treatment: Gait belt;Right knee immobilizer Activity Tolerance: Patient tolerated treatment well Patient left: in chair;with call bell/phone within reach;with chair alarm set;with family/visitor present Nurse Communication: Mobility status    Functional Assessment Tool  Used: clinical judgement Functional Limitation: Mobility: Walking and moving around Mobility: Walking and Moving Around Current Status [id number]): At least 60 percent but less than 80 percent impaired, limited or restricted Mobility: Walking and Moving Around Goal Status [phone]): At least 20 percent but less than 40 percent impaired, limited or restricted    Time: [id number] PT Time Calculation (min) (ACUTE ONLY): 23 min   Charges:   PT Evaluation $PT Eval Moderate Complexity: 1 Procedure PT Treatments $Gait Training: 8-22 mins   PT G Codes:   PT G-Codes **NOT FOR INPATIENT CLASS** Functional Assessment Tool Used: clinical judgement Functional Limitation: Mobility: Walking and moving around Mobility: Walking and Moving Around Current Status [id number]): At least 60 percent but less than 80 percent impaired, limited or restricted Mobility: Walking and Moving Around Goal Status [phone]): At least 20 percent but less than 40 percent impaired, limited or restricted    Marvens Hollars M Christalynn [place] 04/04/2016, 9:04 AM   Kittie Plater, PT, DPT Pager #: [phone] Office #: [phone]

## 2016-04-04 NOTE — Care Management Obs Status (Signed)
[facility] NOTIFICATION   Patient Details  Name: Barry Molina MRN: [id number] Date of Birth: 12/22/1925   Medicare Observation Status Notification Given:  Yes Discussed observation status with pt's dtr, Jettie Booze.    Erenest Rasher, RN 04/04/2016, 11:36 AM

## 2016-04-04 NOTE — Care Management Note (Signed)
Case Management Note  Patient Details  Name: Barry Molina MRN: [id number] Date of Birth: [birth year]  Subjective/Objective:    Elevated troponin, Acute Encephalopathy, Patella Fx                Action/Plan: Discharge Planning: NCM spoke to pt and dtr, Randell Loop # [phone] at bedside. Dtr states he has caregiver from [facility] that comes five days per week from 8-2 pm. Has RW (belong to his wife, does not have leg rest), and RW. PT recommended SNF. Pt and dtr requesting SNF rehab. CSW following for SNF placement.    Expected Discharge Date:  04/04/16               Expected Discharge Plan:  Skilled Nursing Facility  In-House Referral:  Clinical Social Work  Discharge planning Services  CM Consult  Post Acute Care Choice:  NA Choice offered to:  NA  DME Arranged:  N/A DME Agency:  NA  HH Arranged:  NA HH Agency:  NA  Status of Service:  Completed, signed off  If discussed at [employer] of Stay Meetings, dates discussed:    Additional Comments:  Erenest Rasher, RN 04/04/2016, 10:52 AM

## 2016-04-04 NOTE — [place] FL2 (Signed)
[place] MEDICAID FL2 LEVEL OF CARE SCREENING TOOL     IDENTIFICATION  Patient Name: Barry Molina Birthdate: 08/18/1925 Sex: male Admission Date (Current Location): 04/02/2016  [place] and [place] Number:  Herbalist and Address:  [facility], [place] [address] [place], [place] [address]      Provider Number: [id number]  Attending Physician Name and Address:  Geradine Girt, DO  Relative Name and Phone Number:       Current Level of Care: SNF Recommended Level of Care: [place] Prior Approval Number:    Date Approved/Denied:   PASRR Number: [id number]  Discharge Plan: SNF    Current Diagnoses: Patient Active Problem List   Diagnosis Date Noted  . Patella fracture 04/02/2016  . Elevated troponin 04/02/2016  . Arthritis of right knee 01/05/2016  . Acute confusional state 07/12/2015  . Headache 07/12/2015  . Post-concussion syndrome 07/12/2015  . Fracture of cervical vertebra, C5 ([place]) 07/12/2015  . Constipation 06/01/2015  . Acute encephalopathy 04/06/2015  . Well adult exam 03/31/2015  . Left rotator cuff tear arthropathy 04/23/2014  . Trigger index finger of right hand 01/07/2014  . Contusion of face 12/06/2012  . Sinusitis 12/06/2012  . Right knee pain 11/13/2012  . Ataxia 08/08/2012  . Neuropathy of both feet 08/08/2012  . Fall at home 10/23/2011  . Weight loss, abnormal 11/16/2010  . LEG PAIN 05/10/2010  . ELBOW PAIN 10/06/2009  . Malignant melanoma of skin of upper limb, including shoulder ([place]) 09/07/2008  . Edema 08/27/2008  . Personal history of other diseases of digestive system 08/26/2008  . CORONARY ARTERY BYPASS GRAFT, HX OF 08/26/2008  . NEOPLASM, SKIN, UNCERTAIN BEHAVIOR [id number]  . B12 deficiency 12/09/2007  . MUSCLE PAIN 04/10/2007  . SHOULDER PAIN 12/19/2006  . Hypothyroidism 12/14/2006  . Dyslipidemia 12/14/2006  . Essential hypertension 12/14/2006  . Coronary atherosclerosis  12/14/2006  . GERD 12/14/2006  . COLONIC POLYPS, HX OF 12/14/2006    Orientation RESPIRATION BLADDER Height & Weight     Self, Situation  Normal Incontinent Weight: 190 lb 7.6 oz (86.4 kg) Height:  5\' 11"  (180.3 cm)  BEHAVIORAL SYMPTOMS/MOOD NEUROLOGICAL BOWEL NUTRITION STATUS      Continent    AMBULATORY STATUS COMMUNICATION OF NEEDS Skin   Extensive Assist Verbally Normal                       Personal Care Assistance Level of Assistance  Bathing, Feeding, Dressing Bathing Assistance: Limited assistance Feeding assistance: Independent Dressing Assistance: Limited assistance     Functional Limitations Info  Sight, Hearing, Speech Sight Info: Adequate Hearing Info: Impaired Speech Info: Adequate    SPECIAL CARE FACTORS FREQUENCY  PT (By licensed PT), OT (By licensed OT)     PT Frequency: 5x week OT Frequency: 5x week            Contractures Contractures Info: Not present    Additional Factors Info  Code Status Code Status Info: DNR             Current Medications (04/04/2016):  This is the current hospital active medication list Current Facility-Administered Medications  Medication Dose Route Frequency Provider Last Rate Last Dose  . acetaminophen (TYLENOL) tablet 650 mg  650 mg Oral Q6H PRN Radene Gunning, NP   650 mg at 04/02/16 1748   Or  . acetaminophen (TYLENOL) suppository 650 mg  650 mg Rectal Q6H PRN Radene Gunning, NP      .  bisacodyl (DULCOLAX) suppository 10 mg  10 mg Rectal Daily PRN Radene Gunning, NP      . cholecalciferol (VITAMIN D) tablet 1,000 Units  1,000 Units Oral Daily Radene Gunning, NP   1,000 Units at 04/04/16 1029  . enoxaparin (LOVENOX) injection 40 mg  40 mg Subcutaneous Q24H Jessica U Vann, DO   40 mg at 04/04/16 1030  . fluticasone (FLONASE) 50 MCG/ACT nasal spray 1 spray  1 spray Each Nare Daily Lezlie Octave Black, NP      . furosemide (LASIX) tablet 20 mg  20 mg Oral Daily Lezlie Octave Black, NP   20 mg at 04/04/16 1029  .  levothyroxine (SYNTHROID, LEVOTHROID) tablet 125 mcg  125 mcg Oral QAC breakfast Radene Gunning, NP   125 mcg at 04/04/16 0548  . metoprolol succinate (TOPROL-XL) 24 hr tablet 12.5 mg  12.5 mg Oral Daily Lezlie Octave Black, NP   12.5 mg at 04/04/16 1023  . pantoprazole (PROTONIX) EC tablet 40 mg  40 mg Oral Daily Lezlie Octave Black, NP   40 mg at 04/04/16 1021  . traMADol (ULTRAM) tablet 50 mg  50 mg Oral Q12H PRN Radene Gunning, NP   50 mg at 04/03/16 0033  . traZODone (DESYREL) tablet 25 mg  25 mg Oral QHS PRN Radene Gunning, NP   25 mg at 04/03/16 0033  . vitamin B-12 (CYANOCOBALAMIN) tablet 1,000 mcg  1,000 mcg Oral Daily Radene Gunning, NP   1,000 mcg at 04/04/16 1019     Discharge Medications: Please see discharge summary for a list of discharge medications.  Relevant Imaging Results:  Relevant Lab Results:   Additional Information SS#[id number]  Wende Neighbors, LCSW

## 2016-04-04 NOTE — Progress Notes (Signed)
Orthopedics Progress Note  Subjective: Its still a little sore.  Objective:  Vitals:   04/03/16 2125 04/04/16 0601  BP: (!) 143/65 124/69  Pulse: 84 81  Resp: 18 20  Temp: 98.8 F (37.1 C) 98.2 F (36.8 C)    General: Awake and alert  Musculoskeletal: knee swelling improved, no cords, neg Homans, brace adjusted Neurovascularly intact  Lab Results  Component Value Date   WBC 8.8 04/04/2016   HGB 13.9 04/04/2016   HCT 41.4 04/04/2016   MCV 98.6 04/04/2016   PLT 198 04/04/2016       Component Value Date/Time   NA 138 04/04/2016 0244   K 3.3 (L) 04/04/2016 0244   CL 104 04/04/2016 0244   CO2 26 04/04/2016 0244   GLUCOSE 111 (H) 04/04/2016 0244   GLUCOSE 117 (H) 01/26/2006 1631   BUN 21 (H) 04/04/2016 0244   CREATININE 0.98 04/04/2016 0244   CALCIUM 8.6 (L) 04/04/2016 0244   GFRNONAA >60 04/04/2016 0244   GFRAA >60 04/04/2016 0244    Lab Results  Component Value Date   INR 1.17 04/06/2015   INR 1.20 11/03/2010    Assessment/Plan: s/p fall with fractured patells. Non surgical management in knee immobilizer. WBAT right LE with immobilizer in place. No ROM knee PT, OT mobilization D/C planning - SNF Follow up with me in two weeks in the office for [place]. Veverly Fells, MD 04/04/2016 7:20 AM

## 2016-04-04 NOTE — Care Management CC44 (Signed)
Condition Code 44 Documentation Completed  Patient Details  Name: Barry Molina MRN: [id number] Date of Birth: 06/30/1925   Condition Code 44 given:  Yes Patient signature on Condition Code 44 notice:  Yes Documentation of 2 MD's agreement:  Yes Code 44 added to claim:  Yes    Erenest Rasher, RN 04/04/2016, 11:37 AM

## 2016-04-04 NOTE — Clinical Social Work Placement (Signed)
   CLINICAL SOCIAL WORK PLACEMENT  NOTE  Date:  04/04/2016  Patient Details  Name: Barry Molina MRN: [id number] Date of Birth: 23-Apr-1925  Clinical Social Work is seeking post-discharge placement for this patient at the [place] level of care (*CSW will initial, date and re-position this form in  chart as items are completed):  Yes   Patient/family provided with [place] Work Department's list of facilities offering this level of care within the geographic area requested by the patient (or if unable, by the patient's family).  Yes   Patient/family informed of their freedom to choose among providers that offer the needed level of care, that participate in Medicare, Medicaid or managed care program needed by the patient, have an available bed and are willing to accept the patient.  Yes   Patient/family informed of [facility]'s ownership interest in [facility] and [facility], as well as of the fact that they are under no obligation to receive care at these facilities.  PASRR submitted to EDS on       PASRR number received on       Existing PASRR number confirmed on       FL2 transmitted to all facilities in geographic area requested by pt/family on       FL2 transmitted to all facilities within larger geographic area on       Patient informed that his/her managed care company has contracts with or will negotiate with certain facilities, including the following:            Patient/family informed of bed offers received.  Patient chooses bed at       Physician recommends and patient chooses bed at      Patient to be transferred to   on  .  Patient to be transferred to facility by       Patient family notified on   of transfer.  Name of family member notified:        PHYSICIAN Please sign FL2     Additional Comment:    _______________________________________________ Wende Neighbors, LCSW 04/04/2016, 10:31 AM

## 2016-04-04 NOTE — Progress Notes (Signed)
Clinical Social Worker facilitated patient discharge including contacting patient family and facility to confirm patient discharge plans.  Clinical information faxed to facility and family agreeable with plan.  CSW arranged ambulance transport via [facility] to [facility].  RN Tiffany to call [phone] for report prior to discharge.  Clinical Social Worker will sign off for now as social work intervention is no longer needed. Please consult [place] again if new need arises.  Rhea Pink, MSW, [place]

## 2016-04-04 NOTE — Progress Notes (Addendum)
Progress Note  Patient Name: Barry Molina Date of Encounter: 04/04/2016  Primary Cardiologist: Dr. Johnsie Cancel  Subjective   He says he's cold (room is quite chilly - thermometer adjusted). Denies chest pain, SOB, orthopnea, palpitations, edema. Denies any recent cardiac sx prior to admission. Thinks it's 1954.  Inpatient Medications    Scheduled Meds: . cholecalciferol  1,000 Units Oral Daily  . enoxaparin (LOVENOX) injection  40 mg Subcutaneous Q24H  . fluticasone  1 spray Each Nare Daily  . levothyroxine  125 mcg Oral QAC breakfast  . metoprolol succinate  12.5 mg Oral Daily  . pantoprazole  40 mg Oral Daily  . cyanocobalamin  1,000 mcg Oral Daily   Continuous Infusions:  PRN Meds: acetaminophen **OR** acetaminophen, bisacodyl, traMADol, traZODone   Vital Signs    Vitals:   04/03/16 1336 04/03/16 2125 04/04/16 0601 04/04/16 1019  BP: 127/74 (!) 143/65 124/69 118/65  Pulse: 88 84 81 76  Resp: 18 18 20    Temp: 98.3 F (36.8 C) 98.8 F (37.1 C) 98.2 F (36.8 C)   TempSrc: Oral Oral Oral   SpO2: 95% 96% 95%   Weight:   190 lb 7.6 oz (86.4 kg)   Height:        Intake/Output Summary (Last 24 hours) at 04/04/16 1208 Last data filed at 04/04/16 0030  Gross per 24 hour  Intake                0 ml  Output               50 ml  Net              -50 ml   Filed Weights   04/02/16 1527 04/03/16 0500 04/04/16 0601  Weight: 183 lb 10.3 oz (83.3 kg) 184 lb 1.4 oz (83.5 kg) 190 lb 7.6 oz (86.4 kg)    Telemetry    NSR/Sinus Tach - Personally Reviewed  ECG    NSR, RBBB, few PAC's - Personally Reviewed  Physical Exam   GEN: Elderly white male, demented. No acute distress.  HEENT: Normocephalic, atraumatic, sclera non-icteric. Neck: No JVD or bruits. Cardiac: RRR 2/6 SEM murmur at RUSB. No rubs, or gallops.  Radials/DP/PT 1+ and equal bilaterally.  Respiratory: Clear to auscultation bilaterally. Breathing is unlabored. GI: Soft, nontender, non-distended, BS +x 4.    Extremities: No clubbing or cyanosis. No edema. Distal pedal pulses are 2+ and equal bilaterally. Varicose veins noted. Neuro:  A+O to self, place, not time. Follows commands. Psych:  Responds to questions with pleasant affect.   Labs    Chemistry  Recent Labs Lab 04/02/16 1150 04/03/16 0303 04/04/16 0244  NA 141 141 138  K 3.7 3.5 3.3*  CL 106 107 104  CO2 27 25 26   GLUCOSE 108* 93 111*  BUN 21* 19 21*  CREATININE 0.91 0.95 0.98  CALCIUM 9.1 8.6* 8.6*  PROT 6.8  --   --   ALBUMIN 3.8  --   --   AST 46*  --   --   ALT 17  --   --   ALKPHOS 56  --   --   BILITOT 2.6*  --   --   GFRNONAA >60 >60 >60  GFRAA >60 >60 >60  ANIONGAP 8 9 8      Hematology  Recent Labs Lab 04/02/16 1150 04/02/16 1542 04/03/16 0303 04/04/16 0244  WBC 10.9*  --  10.0 8.8  RBC 4.23  --  3.98* 4.20*  HGB 14.2  --  13.1 13.9  HCT 42.4 40.6 39.5 41.4  MCV 100.2*  --  99.2 98.6  MCH 33.6  --  32.9 33.1  MCHC 33.5  --  33.2 33.6  RDW 13.1  --  13.2 13.0  PLT 186  --  182 198    Cardiac Enzymes  Recent Labs Lab 04/02/16 1150 04/02/16 1829 04/03/16 0303  TROPONINI 0.43* 0.40* 0.37*     Radiology    Ct Chest Wo Contrast  Result Date: 04/03/2016 CLINICAL DATA:  Shortness of breath. Follow-up abnormal chest radiograph. EXAM: CT CHEST WITHOUT CONTRAST TECHNIQUE: Multidetector CT imaging of the chest was performed following the standard protocol without IV contrast. COMPARISON:  Chest radiograph April 02, 2016 at 1614 hours FINDINGS: Cardiovascular: The heart is mildly enlarged. Status post CABG. Severe coronary artery calcifications/ stents. No pericardial effusions. Ascending aorta is 4.8 cm in transaxial dimension. Moderate calcific atherosclerosis of the aortic arch. Mediastinum/Nodes: No mediastinal lymphadenopathy, limited by noncontrast CT. Normal appearance of the thoracic esophagus the not tailored for evaluation. Lungs/Pleura: Slight RIGHT apical fibronodular scarring. Minimal  scarring lingula. LEFT lower lobe atelectasis and elevated LEFT hemidiaphragm. Trace RIGHT middle lobe atelectasis. No pleural effusion or focal consolidation. Tracheobronchial tree is patent and midline. No pneumothorax. No RIGHT middle lobe mass, chest radiograph findings reflect RIGHT hilar vessels. Upper Abdomen: Nonobstructing 4 mm LEFT nephrolithiasis. Calcified granuloma LEFT lobe of the liver. Musculoskeletal: Subcentimeter coarse calcification LEFT thyroid. Severe degenerative change of LEFT shoulder, mild with multiple surgical staples. Severe degenerative change of the RIGHT shoulder, with suture anchor. High-riding RIGHT humeral head. Bridging ventral thoracic spine osteophytes. IMPRESSION: Atelectasis. No acute pulmonary process. No RIGHT middle lobe mass or consolidation, chest radiograph findings correspond to RIGHT hilar vessels. Mild cardiomegaly . **An incidental finding of potential clinical significance has been found. 4.7 cm ascending aortic aneurysm. Recommend semi-annual imaging followup by CTA or MRA and referral to cardiothoracic surgery if not already obtained. This recommendation follows 2010 ACCF/AHA/AATS/ACR/ASA/SCA/SCAI/SIR/STS/SVM Guidelines for the Diagnosis and Management of Patients With Thoracic Aortic Disease. Circulation. 2010; [id number]** Electronically Signed   By: Elon Alas M.D.   On: 04/03/2016 00:03   Dg Chest Port 1 View  Result Date: 04/02/2016 CLINICAL DATA:  Encephalopathy acute. Pt has no chest complaints at this time. Hx CAD, HTN, nonsmoker. EXAM: PORTABLE CHEST 1 VIEW COMPARISON:  04/06/2015 FINDINGS: Status post median sternotomy and CABG. Heart is enlarged. There is elevation of left hemidiaphragm which is stable. Shallow lung inflation and slight patient rotation. There is retrocardiac density on the right. Although this may represent hilar vessels accentuated by the shallow lung inflation infiltrate or mass for difficult to exclude. There is  prominence of interstitial markings at the base likely indicating mild interstitial edema. IMPRESSION: 1. Shallow inflation and patient rotation. 2. Question of mass or infiltrate in the medial right lung base. Further evaluation is warranted. Consider PA and lateral chest x-ray if the patient is able. Alternatively, chest CT may be needed. Electronically Signed   By: Nolon Nations M.D.   On: 04/02/2016 16:40    Cardiac Studies   Echo pending.   Patient Profile     83 y.o. male with history of skin cancer, hypothyroidism, HTN, HLD, CAD s/p remote CABG came into [facility] on 04/02/16 who was evaluated for R knee pain, increased confusion, and generalized weakness s/p 2 mechanical falls. He was admitted for non-displaced transverse patellar fracture and an elevated troponin of 0.43 without any acute changes  on ECG.   Assessment & Plan    1. Elevated troponin, flat trend: Patient continues to have no chest pain or concerning symptoms of angina. EKG without acute ischemic changes.  Elevated troponin likely due to demand ischemia in the setting of recent falls and possible hypotension. Will defer to IM if patient is safe to start aspirin. This is held for now in setting of recent falls. Would not place on DAPT given his falling. Pending echo to assess LV function to proceed with next step in care, which will likely remain conservative management in light of dementia and absence of angina.  2. HTN: BUN slightly elevated. Patient with recent falling, ?hypotension. Hold Lasix.   3. HLD: Not on statin therapy due to history of intolerance. Will hold off checking a lipid panel considering patient's advanced age and inability to tolerate statins.   4. Aortic aneurysm: A 4.7 cm ascending aortic aneurysm identified on chest CT. Will need to consider follow-up chest CT in 6 months if appropriate. Conservative management may also be advised.  5. Acute encephalopathy superimposed on baseline dementia: Likely  related to mild dehydration. Folate, B12 normal. Per IM.  6. Hypokalemia: Potassium 3.3. S/p replacement by IM.  7. Patellar fracture: Mgmt per primary team and ortho.  Will follow from a distance and review echo.  Signed, Charlie Pitter, PA-C  04/04/2016, 12:08 PM    I have personally seen and examined this patient with Melina Copa, PA-C I agree with the assessment and plan as outlined above. Troponin mildly elevated. No plans for invasive evaluation given age and dementia. Echo pending to assess LV function. We will follow from a distance.   Lauree Chandler 04/04/2016 12:19 PM

## 2016-04-04 NOTE — Care Management Obs Status (Signed)
[place] NOTIFICATION   Patient Details  Name: Barry Molina MRN: [id number] Date of Birth: 1926/02/25   Medicare Observation Status Notification Given:  Yes    Erenest Rasher, RN 04/04/2016, 10:48 AM

## 2016-04-04 NOTE — Clinical Social Work Note (Signed)
Clinical Social Work Assessment  Patient Details  Name: Barry Molina MRN: [id number] Date of Birth: 1926-01-14  Date of referral:  04/04/16               Reason for consult:  Discharge Planning                Permission sought to share information with:  Family Supports Permission granted to share information::  Yes, Verbal Permission Granted  Name::     Randell Loop  Agency::     Relationship::  daughter  Contact Information:  [phone]  Housing/Transportation Living arrangements for the past 2 months:  [place] of Information:  Adult Children Patient Interpreter Needed:  None Criminal Activity/Legal Involvement Pertinent to Current Situation/Hospitalization:    Significant Relationships:    Lives with:  Self Do you feel safe going back to the place where you live?  Yes Need for family participation in patient care:  No (Coment)  Care giving concerns:  Daughter in the room during assessment   Social Worker assessment / plan: Clinical Social Worker met patient at bedside to offer support and discharge needs. Patient an daughter agreeable to discharge to SNF. Daughter stated that patient lives at home by himself and has a Actuary for  6 hrs but sitter will be unable to give him the level of care patient requires at the moment.Daughter prefers [facility] [employer] [facility] because it is close to home. CSW to complete necessary paperwork and initiate SNF search on patient behalf. CSW to follow up once bed offers are available. CSW remains available for support and to facilitate patient discharge needs once medically ready.   Employment status:  Retired Forensic scientist:  Medicare PT Recommendations:  [place] / Referral to community resources:  [place]  Patient/Family's Response to care:  Patient verbalized appreciation and understanding for CSW role and involvement in [place]. Patient agreeable with current discharge  plan to SNF following discharge  Patient/Family's Understanding of and Emotional Response to Diagnosis, Current Treatment, and Prognosis:  Patient with good understanding of current medical state and limitations around most recent hospitalization. pateint agreeable with SNF placement in hopes of transitioning  back home  Emotional Assessment Appearance:  Appears stated age Attitude/Demeanor/Rapport:  Other Affect (typically observed):  Happy Orientation:  Oriented to  Time, Oriented to Situation, Oriented to Place, Oriented to Self Alcohol / Substance use:  Not Applicable Psych involvement (Current and /or in the community):  No (Comment)  Discharge Needs  Concerns to be addressed:  No discharge needs identified Readmission within the last 30 days:  No Current discharge risk:  None Barriers to Discharge:  No Barriers Identified   Wende Neighbors, LCSW 04/04/2016, 10:24 AM

## 2016-04-04 NOTE — Discharge Summary (Addendum)
Physician Discharge Summary  Barry Molina [id number] DOB: [birth year] DOA: 04/02/2016  PCP: Walker Kehr, MD  Admit date: 04/02/2016 Discharge date: 04/05/2016   Recommendations for Outpatient Follow-Up:   DNR Echo reading pending WBAT right LE with immobilizer in place. No ROM knee PT, OT mobilization Follow up with ortho in two weeks in the office for XRAYs lovenox while not ambulatory   Discharge Diagnosis:   Principal Problem:   Elevated troponin Active Problems:   Hypothyroidism   B12 deficiency   Dyslipidemia   Essential hypertension   CORONARY ARTERY BYPASS GRAFT, HX OF   Acute encephalopathy   Constipation   Patella fracture   Discharge disposition:  SNF  Discharge Condition: Improved.  Diet recommendation: Low sodium, heart healthy.  Wound care: None.   History of Present Illness:   Barry Molina is a 83 y.o. male with medical history significant for attention, hyperlipidemia, GERD, hypothyroidism, CAD status post CABG, B-12 deficiency, presents to [facility] [address] from [facility] with the chief complaint of elevated troponin and acute encephalopathy. Initial evaluation reveals troponin 0.43. Recent admitted to rule out and workup acute encephalopathy  Information is pain from the patient and the daughter who is at the bedside noting that information from patient may be unreliable secondary to acute encephalopathy setting of most likely early dementia. Patient reports 2 days ago he experienced a mechanical fall the gas station. States he "tripped" and fell onto his knee. He had difficulty getting up but was assisted in drove himself home. He reports pain and swelling to that right knee he applied ice with some relief. Daughter reports that yesterday afternoon she went to "check on him" and found him on the floor. She's not sure how long he was down there. He again reports he suffered a mechanical fall and doesn't feel like he was down there "very  long". But he did not attempt to call for assistance. Family had difficulty managing patient as he was complaining of worsening pain to that right knee and was unable to bear weight. Associated symptoms include generalized weakness and increased confusion. no Reports of any dysuria hematuria frequency or urgency. No reports of nausea vomiting diarrhea constipation. Patient denies any headache visual disturbances dizziness syncope or near-syncope. He denies a chest pain palpitations shortness of breath lower extremity edema or orthopnea.    Hospital Course by Problem:   Elevated troponin/elevated CK. History of CAD status post bypass graft in 2010. Heart score 4.Patient fell 2 over the last 2 days. Most recent fall yesterday at home likely related to injured right knee.unclear how long he was down. No loss of consciousness reported.  -echo read -seen by cards- no current intervention needed  Acute encephalopathy. Likely related to mild dehydration.Suspect patient's baseline is early dementia as he is unable to manage his meds and/or appointments. Has family close by and outside help 5 hours a day. He is oriented to person and place only. Denies any loss of consciousness with his recent falls. No metabolic derangement. Neuro exam benign. No signs symptoms of infection. -hold altering medication -B12 normal   Patella fracture. X-ray as noted above. Knee immobilizer  in place -WBAT right LE with immobilizer in place. No ROM knee PT, OT mobilization Follow up with ortho in two weeks in the office for XRAYs  Hypertension. Controlled. -homemedications as noted above  Hypokalemia Replete   Medical Consultants:    Ortho  cards   Discharge Exam:   Vitals:  04/04/16 2203 04/05/16 0350  BP: 121/75 131/80  Pulse: 79 73  Resp: 18 18  Temp: 98.1 F (36.7 C) 97.5 F (36.4 C)   Vitals:   04/04/16 1019 04/04/16 1342 04/04/16 2203 04/05/16 0350  BP: 118/65 104/70 121/75 131/80    Pulse: 76 87 79 73  Resp:  16 18 18   Temp:  98.8 F (37.1 C) 98.1 F (36.7 C) 97.5 F (36.4 C)  TempSrc:  Oral Oral Oral  SpO2:  95% 92% 96%  Weight:    81.1 kg (178 lb 12.7 oz)  Height:        Gen:  NAD    The results of significant diagnostics from this hospitalization (including imaging, microbiology, ancillary and laboratory) are listed below for reference.     Procedures and Diagnostic Studies:   Ct Chest Wo Contrast  Result Date: 04/03/2016 CLINICAL DATA:  Shortness of breath. Follow-up abnormal chest radiograph. EXAM: CT CHEST WITHOUT CONTRAST TECHNIQUE: Multidetector CT imaging of the chest was performed following the standard protocol without IV contrast. COMPARISON:  Chest radiograph April 02, 2016 at 1614 hours FINDINGS: Cardiovascular: The heart is mildly enlarged. Status post CABG. Severe coronary artery calcifications/ stents. No pericardial effusions. Ascending aorta is 4.8 cm in transaxial dimension. Moderate calcific atherosclerosis of the aortic arch. Mediastinum/Nodes: No mediastinal lymphadenopathy, limited by noncontrast CT. Normal appearance of the thoracic esophagus the not tailored for evaluation. Lungs/Pleura: Slight RIGHT apical fibronodular scarring. Minimal scarring lingula. LEFT lower lobe atelectasis and elevated LEFT hemidiaphragm. Trace RIGHT middle lobe atelectasis. No pleural effusion or focal consolidation. Tracheobronchial tree is patent and midline. No pneumothorax. No RIGHT middle lobe mass, chest radiograph findings reflect RIGHT hilar vessels. Upper Abdomen: Nonobstructing 4 mm LEFT nephrolithiasis. Calcified granuloma LEFT lobe of the liver. Musculoskeletal: Subcentimeter coarse calcification LEFT thyroid. Severe degenerative change of LEFT shoulder, mild with multiple surgical staples. Severe degenerative change of the RIGHT shoulder, with suture anchor. High-riding RIGHT humeral head. Bridging ventral thoracic spine osteophytes. IMPRESSION:  Atelectasis. No acute pulmonary process. No RIGHT middle lobe mass or consolidation, chest radiograph findings correspond to RIGHT hilar vessels. Mild cardiomegaly . **An incidental finding of potential clinical significance has been found. 4.7 cm ascending aortic aneurysm. Recommend semi-annual imaging followup by CTA or MRA and referral to cardiothoracic surgery if not already obtained. This recommendation follows 2010 ACCF/AHA/AATS/ACR/ASA/SCA/SCAI/SIR/STS/SVM Guidelines for the Diagnosis and Management of Patients With Thoracic Aortic Disease. Circulation. 2010; [id number]** Electronically Signed   By: Elon Alas M.D.   On: 04/03/2016 00:03   Dg Chest Port 1 View  Result Date: 04/02/2016 CLINICAL DATA:  Encephalopathy acute. Pt has no chest complaints at this time. Hx CAD, HTN, nonsmoker. EXAM: PORTABLE CHEST 1 VIEW COMPARISON:  04/06/2015 FINDINGS: Status post median sternotomy and CABG. Heart is enlarged. There is elevation of left hemidiaphragm which is stable. Shallow lung inflation and slight patient rotation. There is retrocardiac density on the right. Although this may represent hilar vessels accentuated by the shallow lung inflation infiltrate or mass for difficult to exclude. There is prominence of interstitial markings at the base likely indicating mild interstitial edema. IMPRESSION: 1. Shallow inflation and patient rotation. 2. Question of mass or infiltrate in the medial right lung base. Further evaluation is warranted. Consider PA and lateral chest x-ray if the patient is able. Alternatively, chest CT may be needed. Electronically Signed   By: Nolon Nations M.D.   On: 04/02/2016 16:40   Dg Knee Complete 4 Views Right  Result Date: 04/02/2016  CLINICAL DATA:  Pain after fall. EXAM: RIGHT KNEE - COMPLETE 4+ VIEW COMPARISON:  None. FINDINGS: Patellar fracture. Large joint effusion. Linear interface between the effusion and overlying fat is favored to be due to something on the  patient as the linearity extends inferior to the knee. No convincing evidence of tibial plateau fracture. No other fractures. IMPRESSION: 1. Patellar fracture. 2. Large joint effusion. The linear interface between the effusion and adjacent fat is thought to be artifactual as described above. No visualized tibial plateau fracture. If there is high concern for tibial plateau fracture, a repeat lateral view could be obtained without anything on the patient. Electronically Signed   By: Dorise Bullion III M.D   On: 04/02/2016 12:17     Labs:   Basic Metabolic Panel:  Recent Labs Lab 04/02/16 1150 04/03/16 0303 04/04/16 0244  NA 141 141 138  K 3.7 3.5 3.3*  CL 106 107 104  CO2 27 25 26   GLUCOSE 108* 93 111*  BUN 21* 19 21*  CREATININE 0.91 0.95 0.98  CALCIUM 9.1 8.6* 8.6*   GFR Estimated Creatinine Clearance: 54.4 mL/min (by C-G formula based on SCr of 0.98 mg/dL). Liver Function Tests:  Recent Labs Lab 04/02/16 1150  AST 46*  ALT 17  ALKPHOS 56  BILITOT 2.6*  PROT 6.8  ALBUMIN 3.8   No results for input(s): LIPASE, AMYLASE in the last 168 hours. No results for input(s): AMMONIA in the last 168 hours. Coagulation profile No results for input(s): INR, PROTIME in the last 168 hours.  CBC:  Recent Labs Lab 04/02/16 1150 04/02/16 1542 04/03/16 0303 04/04/16 0244  WBC 10.9*  --  10.0 8.8  NEUTROABS 7.9*  --   --   --   HGB 14.2  --  13.1 13.9  HCT 42.4 40.6 39.5 41.4  MCV 100.2*  --  99.2 98.6  PLT 186  --  182 198   Cardiac Enzymes:  Recent Labs Lab 04/02/16 1150 04/02/16 1829 04/03/16 0303  CKTOTAL 622*  --  296  TROPONINI 0.43* 0.40* 0.37*   BNP: Invalid input(s): POCBNP CBG: No results for input(s): GLUCAP in the last 168 hours. D-Dimer No results for input(s): DDIMER in the last 72 hours. Hgb A1c No results for input(s): HGBA1C in the last 72 hours. Lipid Profile No results for input(s): CHOL, HDL, LDLCALC, TRIG, CHOLHDL, LDLDIRECT in the last 72  hours. Thyroid function studies No results for input(s): TSH, T4TOTAL, T3FREE, THYROIDAB in the last 72 hours.  Invalid input(s): FREET3 Anemia work up  Recent Labs  04/02/16 [place]   Microbiology No results found for this or any previous visit (from the past 240 hour(s)).   Discharge Instructions:   Discharge Instructions    Diet - low sodium heart healthy    Complete by:  As directed    Increase activity slowly    Complete by:  As directed      Allergies as of 04/05/2016      Reactions   Ezetimibe-simvastatin Other (See Comments)    aches   Morphine Nausea And Vomiting      Medication List    STOP taking these medications   aspirin 325 MG tablet     TAKE these medications   acetaminophen 325 MG tablet Commonly known as:  TYLENOL Take 650 mg by mouth 4 (four) times daily. Take ever day per daughter   cyanocobalamin 1000 MCG tablet Take 1,000 mcg by mouth daily.   enoxaparin 40  MG/0.4ML injection Commonly known as:  LOVENOX Inject 0.4 mLs (40 mg total) into the skin daily. Start taking on:  04/06/2016   fluticasone 50 MCG/ACT nasal spray Commonly known as:  FLONASE USE 1 SPRAY IN EACH NOSTRIL DAILY AS NEEDED What changed:  See the new instructions.   furosemide 20 MG tablet Commonly known as:  LASIX TAKE ONE TO TWO TABLETS BY MOUTH ONCE DAILY AS NEEDED FOR  EDEMA   levothyroxine 125 MCG tablet Commonly known as:  SYNTHROID, LEVOTHROID Take 1 tablet (125 mcg total) by mouth daily.   metoprolol succinate 25 MG 24 hr tablet Commonly known as:  TOPROL-XL TAKE ONE-HALF TABLET BY MOUTH ONCE DAILY   omeprazole 40 MG capsule Commonly known as:  PRILOSEC TAKE ONE CAPSULE BY MOUTH ONCE DAILY   traMADol 50 MG tablet Commonly known as:  ULTRAM Take 50 mg by mouth every other day.   traZODone 50 MG tablet Commonly known as:  DESYREL Take 0.5 tablets (25 mg total) by mouth at bedtime as needed for sleep.   Vitamin D (Ergocalciferol) 50000  units Caps capsule Commonly known as:  DRISDOL Take 50,000 Units by mouth every 7 (seven) days. Take on fridays   Vitamin D3 1000 units Caps Take 1,000 Units by mouth daily.      Follow-up Information    NORRIS,STEVEN R, MD Follow up in 2 day(s).   Specialty:  Orthopedic Surgery Why:  follow up with office in two weeks Contact information: [address] [place] [address] [id number]        Walker Kehr, MD Follow up in 1 week(s).   Specialty:  Internal Medicine Contact information: [place] [address] [phone]            Time coordinating discharge: 35 min  Signed:  [place]   [facility] 04/05/2016, 8:43 AM

## 2016-04-05 MED ORDER — ENOXAPARIN SODIUM 40 MG/0.4ML ~~LOC~~ SOLN: 40.0000 mg | SUBCUTANEOUS | Status: DC

## 2016-04-05 NOTE — Progress Notes (Addendum)
Clinical Social Worker facilitated patient discharge including contacting patient family and facility to confirm patient discharge plans.  Clinical information faxed to facility and family agreeable with plan.  CSW arranged ambulance transport via [facility] to [facility].  RN Miranda to call the nurse for room 708 [phone]) report prior to discharge.  Clinical Social Worker will sign off for now as social work intervention is no longer needed. Please consult [place] again if new need arises.  Rhea Pink, MSW, [place]

## 2016-04-05 NOTE — Progress Notes (Signed)
Orthopedics Progress Note  Subjective: No complaints this morning. Ready to head to [facility] today.  Objective:  Vitals:   04/04/16 2203 04/05/16 0350  BP: 121/75 131/80  Pulse: 79 73  Resp: 18 18  Temp: 98.1 F (36.7 C) 97.5 F (36.4 C)    General: Awake and alert  Musculoskeletal: Right knee swelling down. No cords, pain free ankle pumps. Knee immobilizer in place Neurovascularly intact  Lab Results  Component Value Date   WBC 8.8 04/04/2016   HGB 13.9 04/04/2016   HCT 41.4 04/04/2016   MCV 98.6 04/04/2016   PLT 198 04/04/2016       Component Value Date/Time   NA 138 04/04/2016 0244   K 3.3 (L) 04/04/2016 0244   CL 104 04/04/2016 0244   CO2 26 04/04/2016 0244   GLUCOSE 111 (H) 04/04/2016 0244   GLUCOSE 117 (H) 01/26/2006 1631   BUN 21 (H) 04/04/2016 0244   CREATININE 0.98 04/04/2016 0244   CALCIUM 8.6 (L) 04/04/2016 0244   GFRNONAA >60 04/04/2016 0244   GFRAA >60 04/04/2016 0244    Lab Results  Component Value Date   INR 1.17 04/06/2015   INR 1.20 11/03/2010    Assessment/Plan:  s/p mechanical fall with nondisplaced patella fracture. D/C to rehab. Follow up in the office in two weeks  [place]. Veverly Fells, MD 04/05/2016 7:44 AM

## 2016-04-05 NOTE — Progress Notes (Signed)
Patient discharged to [facility] in [place], transported by [employer]. Family aware of discharge plan, discharge packet faxed to ALF by MSW.  Jolan Upchurch, Tivis Ringer, RN

## 2016-04-07 ENCOUNTER — Other Ambulatory Visit: Payer: Self-pay | Admitting: Family Medicine

## 2016-04-07 MED ORDER — TRAMADOL HCL 50 MG PO TABS: 50.0000 mg | ORAL_TABLET | Freq: Two times a day (BID) | ORAL | 3 refills | Status: DC | PRN

## 2016-05-08 ENCOUNTER — Ambulatory Visit (INDEPENDENT_AMBULATORY_CARE_PROVIDER_SITE_OTHER): Payer: Medicare Other | Admitting: Internal Medicine

## 2016-05-08 ENCOUNTER — Other Ambulatory Visit (INDEPENDENT_AMBULATORY_CARE_PROVIDER_SITE_OTHER): Payer: Medicare Other

## 2016-05-08 ENCOUNTER — Encounter: Payer: Self-pay | Admitting: Internal Medicine

## 2016-05-08 DIAGNOSIS — S82001D Unspecified fracture of right patella, subsequent encounter for closed fracture with routine healing: Secondary | ICD-10-CM | POA: Diagnosis not present

## 2016-05-08 DIAGNOSIS — I2510 Atherosclerotic heart disease of native coronary artery without angina pectoris: Secondary | ICD-10-CM

## 2016-05-08 DIAGNOSIS — R270 Ataxia, unspecified: Secondary | ICD-10-CM | POA: Diagnosis not present

## 2016-05-08 DIAGNOSIS — I10 Essential (primary) hypertension: Secondary | ICD-10-CM

## 2016-05-08 DIAGNOSIS — E538 Deficiency of other specified B group vitamins: Secondary | ICD-10-CM

## 2016-05-08 DIAGNOSIS — R600 Localized edema: Secondary | ICD-10-CM | POA: Diagnosis not present

## 2016-05-08 LAB — BASIC METABOLIC PANEL
BUN: 19 mg/dL (ref 6–23)
CHLORIDE: 105 meq/L (ref 96–112)
CO2: 29 mEq/L (ref 19–32)
Calcium: 9.4 mg/dL (ref 8.4–10.5)
Creatinine, Ser: 0.97 mg/dL (ref 0.40–1.50)
GFR: 77.43 mL/min (ref >60.00)
Glucose, Bld: 91 mg/dL (ref 70–99)
POTASSIUM: 3.8 meq/L (ref 3.5–5.1)
SODIUM: 142 meq/L (ref 135–145)

## 2016-05-08 NOTE — Assessment & Plan Note (Signed)
On B12 

## 2016-05-08 NOTE — Assessment & Plan Note (Signed)
PT

## 2016-05-08 NOTE — Assessment & Plan Note (Signed)
BMET Toprol, furosemide

## 2016-05-08 NOTE — Assessment & Plan Note (Signed)
Toprol ASA

## 2016-05-08 NOTE — Assessment & Plan Note (Addendum)
No edema now Mobility for DVT prophylaxis. Call if swelling is worse

## 2016-05-08 NOTE — Progress Notes (Signed)
Pre-visit discussion using our clinic review tool. No additional management support is needed unless otherwise documented below in the visit note.  

## 2016-05-08 NOTE — Assessment & Plan Note (Addendum)
F/u w/Dr Alvan Dame next week PT Mobility for DVT prophylaxis

## 2016-05-08 NOTE — Progress Notes (Signed)
Subjective:  Patient ID: Barry Molina, male    DOB: May 01, 1925  Age: 83 y.o. MRN: [id number]  CC: Follow-up (right knee injury f/u )   HPI Barry Molina presents for patella fx, HTN, OA, CAD f/u  Outpatient Medications Prior to Visit  Medication Sig Dispense Refill  . acetaminophen (TYLENOL) 325 MG tablet Take 650 mg by mouth 4 (four) times daily. Take ever day per daughter    . Cholecalciferol (VITAMIN D3) 1000 UNITS CAPS Take 1,000 Units by mouth daily.     . cyanocobalamin 1000 MCG tablet Take 1,000 mcg by mouth daily.    . fluticasone (FLONASE) 50 MCG/ACT nasal spray USE 1 SPRAY IN EACH NOSTRIL DAILY AS NEEDED (Patient taking differently: USE 1 SPRAY IN EACH NOSTRIL DAILY AS NEEDED FOR ALLERGIES) 16 g 1  . furosemide (LASIX) 20 MG tablet TAKE ONE TO TWO TABLETS BY MOUTH ONCE DAILY AS NEEDED FOR  EDEMA 60 tablet 3  . levothyroxine (SYNTHROID, LEVOTHROID) 125 MCG tablet Take 1 tablet (125 mcg total) by mouth daily. 90 tablet 3  . metoprolol succinate (TOPROL-XL) 25 MG 24 hr tablet TAKE ONE-HALF TABLET BY MOUTH ONCE DAILY 45 tablet 3  . omeprazole (PRILOSEC) 40 MG capsule TAKE ONE CAPSULE BY MOUTH ONCE DAILY 90 capsule 3  . traMADol (ULTRAM) 50 MG tablet Take 1 tablet (50 mg total) by mouth every 12 (twelve) hours as needed. 60 tablet 3  . Vitamin D, Ergocalciferol, (DRISDOL) 50000 units CAPS capsule Take 50,000 Units by mouth every 7 (seven) days. Take on fridays    . enoxaparin (LOVENOX) 40 MG/0.4ML injection Inject 0.4 mLs (40 mg total) into the skin daily. 0 Syringe   . traZODone (DESYREL) 50 MG tablet Take 0.5 tablets (25 mg total) by mouth at bedtime as needed for sleep.     No facility-administered medications prior to visit.     ROS Review of Systems  Constitutional: Positive for fatigue. Negative for appetite change and unexpected weight change.  HENT: Negative for congestion, nosebleeds, sneezing, sore throat and trouble swallowing.   Eyes: Negative for itching and  visual disturbance.  Respiratory: Negative for cough.   Cardiovascular: Negative for chest pain, palpitations and leg swelling.  Gastrointestinal: Negative for abdominal distention, blood in stool, diarrhea and nausea.  Genitourinary: Negative for frequency and hematuria.  Musculoskeletal: Positive for arthralgias, gait problem and joint swelling. Negative for back pain and neck pain.  Skin: Negative for rash.  Neurological: Negative for dizziness, tremors, speech difficulty and weakness.  Psychiatric/Behavioral: Positive for decreased concentration. Negative for agitation, behavioral problems, dysphoric mood and sleep disturbance. The patient is not nervous/anxious.     Objective:  BP 126/64   Pulse 65   Temp 99 F (37.2 C) (Oral)   Resp 16   Wt 176 lb (79.8 kg)   SpO2 97%   BMI 24.55 kg/m   BP Readings from Last 3 Encounters:  05/08/16 126/64  04/05/16 131/80  01/31/16 136/80    Wt Readings from Last 3 Encounters:  05/08/16 176 lb (79.8 kg)  04/05/16 178 lb 12.7 oz (81.1 kg)  01/31/16 187 lb (84.8 kg)    Physical Exam  Constitutional: He is oriented to person, place, and time. He appears well-developed. No distress.  NAD  HENT:  Mouth/Throat: Oropharynx is clear and moist.  Eyes: Conjunctivae are normal. Pupils are equal, round, and reactive to light.  Neck: Normal range of motion. No JVD present. No thyromegaly present.  Cardiovascular: Normal rate, regular rhythm,  normal heart sounds and intact distal pulses.  Exam reveals no gallop and no friction rub.   No murmur heard. Pulmonary/Chest: Effort normal and breath sounds normal. No respiratory distress. He has no wheezes. He has no rales. He exhibits no tenderness.  Abdominal: Soft. Bowel sounds are normal. He exhibits no distension and no mass. There is no tenderness. There is no rebound and no guarding.  Musculoskeletal: Normal range of motion. He exhibits tenderness.  Lymphadenopathy:    He has no cervical  adenopathy.  Neurological: He is alert and oriented to person, place, and time. He has normal reflexes. No cranial nerve deficit. He exhibits normal muscle tone. He displays a negative Romberg sign. Coordination abnormal. Gait normal.  Skin: Skin is warm and dry. No rash noted.  Psychiatric: His behavior is normal. Judgment and thought content normal.  Immobalizer on the R leg Walker  Lab Results  Component Value Date   WBC 8.8 04/04/2016   HGB 13.9 04/04/2016   HCT 41.4 04/04/2016   PLT 198 04/04/2016   GLUCOSE 111 (H) 04/04/2016   CHOL 223 (H) 03/31/2015   TRIG 125.0 03/31/2015   HDL 40.00 03/31/2015   LDLDIRECT 189.0 01/28/2009   LDLCALC 158 (H) 03/31/2015   ALT 17 04/02/2016   AST 46 (H) 04/02/2016   NA 138 04/04/2016   K 3.3 (L) 04/04/2016   CL 104 04/04/2016   CREATININE 0.98 04/04/2016   BUN 21 (H) 04/04/2016   CO2 26 04/04/2016   TSH 2.31 11/03/2015   PSA 2.77 03/31/2015   INR 1.17 04/06/2015    Ct Chest Wo Contrast  Result Date: 04/03/2016 CLINICAL DATA:  Shortness of breath. Follow-up abnormal chest radiograph. EXAM: CT CHEST WITHOUT CONTRAST TECHNIQUE: Multidetector CT imaging of the chest was performed following the standard protocol without IV contrast. COMPARISON:  Chest radiograph April 02, 2016 at 1614 hours FINDINGS: Cardiovascular: The heart is mildly enlarged. Status post CABG. Severe coronary artery calcifications/ stents. No pericardial effusions. Ascending aorta is 4.8 cm in transaxial dimension. Moderate calcific atherosclerosis of the aortic arch. Mediastinum/Nodes: No mediastinal lymphadenopathy, limited by noncontrast CT. Normal appearance of the thoracic esophagus the not tailored for evaluation. Lungs/Pleura: Slight RIGHT apical fibronodular scarring. Minimal scarring lingula. LEFT lower lobe atelectasis and elevated LEFT hemidiaphragm. Trace RIGHT middle lobe atelectasis. No pleural effusion or focal consolidation. Tracheobronchial tree is patent and  midline. No pneumothorax. No RIGHT middle lobe mass, chest radiograph findings reflect RIGHT hilar vessels. Upper Abdomen: Nonobstructing 4 mm LEFT nephrolithiasis. Calcified granuloma LEFT lobe of the liver. Musculoskeletal: Subcentimeter coarse calcification LEFT thyroid. Severe degenerative change of LEFT shoulder, mild with multiple surgical staples. Severe degenerative change of the RIGHT shoulder, with suture anchor. High-riding RIGHT humeral head. Bridging ventral thoracic spine osteophytes. IMPRESSION: Atelectasis. No acute pulmonary process. No RIGHT middle lobe mass or consolidation, chest radiograph findings correspond to RIGHT hilar vessels. Mild cardiomegaly . **An incidental finding of potential clinical significance has been found. 4.7 cm ascending aortic aneurysm. Recommend semi-annual imaging followup by CTA or MRA and referral to cardiothoracic surgery if not already obtained. This recommendation follows 2010 ACCF/AHA/AATS/ACR/ASA/SCA/SCAI/SIR/STS/SVM Guidelines for the Diagnosis and Management of Patients With Thoracic Aortic Disease. Circulation. 2010; 121: [id number]** Electronically Signed   By: Elon Alas M.D.   On: 04/03/2016 00:03   Dg Chest Port 1 View  Result Date: 04/02/2016 CLINICAL DATA:  Encephalopathy acute. Pt has no chest complaints at this time. Hx CAD, HTN, nonsmoker. EXAM: PORTABLE CHEST 1 VIEW COMPARISON:  04/06/2015 FINDINGS:  Status post median sternotomy and CABG. Heart is enlarged. There is elevation of left hemidiaphragm which is stable. Shallow lung inflation and slight patient rotation. There is retrocardiac density on the right. Although this may represent hilar vessels accentuated by the shallow lung inflation infiltrate or mass for difficult to exclude. There is prominence of interstitial markings at the base likely indicating mild interstitial edema. IMPRESSION: 1. Shallow inflation and patient rotation. 2. Question of mass or infiltrate in the medial right  lung base. Further evaluation is warranted. Consider PA and lateral chest x-ray if the patient is able. Alternatively, chest CT may be needed. Electronically Signed   By: Nolon Nations M.D.   On: 04/02/2016 16:40   Dg Knee Complete 4 Views Right  Result Date: 04/02/2016 CLINICAL DATA:  Pain after fall. EXAM: RIGHT KNEE - COMPLETE 4+ VIEW COMPARISON:  None. FINDINGS: Patellar fracture. Large joint effusion. Linear interface between the effusion and overlying fat is favored to be due to something on the patient as the linearity extends inferior to the knee. No convincing evidence of tibial plateau fracture. No other fractures. IMPRESSION: 1. Patellar fracture. 2. Large joint effusion. The linear interface between the effusion and adjacent fat is thought to be artifactual as described above. No visualized tibial plateau fracture. If there is high concern for tibial plateau fracture, a repeat lateral view could be obtained without anything on the patient. Electronically Signed   By: Dorise Bullion III M.D   On: 04/02/2016 12:17    Assessment & Plan:   There are no diagnoses linked to this encounter. I have discontinued Mr. Massi traZODone and enoxaparin. I am also having him maintain his Vitamin D3, fluticasone, cyanocobalamin, levothyroxine, furosemide, omeprazole, metoprolol succinate, acetaminophen, Vitamin D (Ergocalciferol), and traMADol.  No orders of the defined types were placed in this encounter.    Follow-up: No Follow-up on file.  Walker Kehr, MD

## 2016-05-11 ENCOUNTER — Ambulatory Visit: Payer: Medicare Other | Admitting: Nurse Practitioner

## 2016-05-20 ENCOUNTER — Other Ambulatory Visit: Payer: Self-pay | Admitting: Internal Medicine

## 2016-07-03 ENCOUNTER — Ambulatory Visit: Payer: Medicare Other | Admitting: Internal Medicine

## 2016-07-10 ENCOUNTER — Other Ambulatory Visit: Payer: Self-pay | Admitting: Family Medicine

## 2016-07-10 ENCOUNTER — Other Ambulatory Visit: Payer: Self-pay | Admitting: Internal Medicine

## 2016-07-13 ENCOUNTER — Other Ambulatory Visit: Payer: Self-pay | Admitting: Internal Medicine

## 2016-07-14 ENCOUNTER — Other Ambulatory Visit: Payer: Self-pay | Admitting: Family Medicine

## 2016-07-14 MED ORDER — TRAMADOL HCL 50 MG PO TABS: ORAL_TABLET | ORAL | 3 refills | Status: DC

## 2016-07-20 ENCOUNTER — Other Ambulatory Visit: Payer: Self-pay | Admitting: Internal Medicine

## 2016-08-05 NOTE — Progress Notes (Signed)
[facility] [place], [place] [id number] Phone: [phone] Subjective:     CC: Follow-up left shoulder f/u   [id number]  Barry Molina is a 83 y.o. male follow-up for left shoulder pain. Severe arthritis of the left shoulder. Patient has severe arthritic changes of the shoulder. Injections do help Injection has been quite some time. Pain is worsening again. Waking him up at night. Affecting daily activities.     Past Medical History:  Diagnosis Date  . B12 DEFICIENCY 12/09/2007  . Cataract   . COLONIC POLYPS, HX OF 12/14/2006  . CORONARY ARTERY BYPASS GRAFT, HX OF 08/26/2008  . CORONARY ARTERY DISEASE 12/14/2006  . Edema 08/27/2008  . ELBOW PAIN 10/06/2009  . GERD 12/14/2006  . HYPERLIPIDEMIA 12/14/2006  . HYPERTENSION 12/14/2006  . HYPOTHYROIDISM 12/14/2006  . LEG PAIN 05/10/2010  . Malignant melanoma of skin of upper limb, including shoulder ([place]) 09/07/2008   states he just had places removed;  not cancers  . MUSCLE PAIN 04/10/2007  . NEOPLASM, SKIN, UNCERTAIN BEHAVIOR 9/92/4268  . Other specified acquired hypothyroidism 04/08/2007  . Personal history of other diseases of digestive disease 08/26/2008  . SHOULDER PAIN 12/19/2006   Past Surgical History:  Procedure Laterality Date  . BACK SURGERY  09/27/10  . CHOLECYSTECTOMY  9/12  . CORONARY ARTERY BYPASS GRAFT    . right ankle surgery    . ROTATOR CUFF REPAIR     bilateral  . SPINE SURGERY  8/12   LS spine cyst   Social History  Substance Use Topics  . Smoking status: Never Smoker  . Smokeless tobacco: Never Used  . Alcohol use No   Allergies  Allergen Reactions  . Ezetimibe-Simvastatin Other (See Comments)     aches  . Morphine Nausea And Vomiting   Family History  Problem Relation Age of Onset  . Hypertension Father   . Cancer Father   . Stroke Mother 62  . Heart disease Other      Past medical history, social, surgical and family history all reviewed in  electronic medical record.   Review of Systems: No headache, visual changes, nausea, vomiting, diarrhea, constipation, dizziness, abdominal pain, skin rash, fevers, chills, night sweats, weight loss, swollen lymph nodes, body aches, joint swelling, chest pain, shortness of breath, mood changes.  + muscle aches.   Objective  There were no vitals taken for this visit.  Systems examined below as of 08/07/16 General: NAD A&O x3 mood, affect normal  HEENT: Pupils equal, extraocular movements intact no nystagmus Respiratory: not short of breath at rest or with speaking Cardiovascular: No lower extremity edema, non tender Skin: Warm dry intact with no signs of infection or rash on extremities or on axial skeleton. Abdomen: Soft nontender, no masses Neuro: Cranial nerves  intact, neurovascularly intact in all extremities with 2+ DTRs and 2+ pulses. Lymph: No lymphadenopathy appreciated today  Gait antalgic gait walks with a walker MSK:  mildtender with full range of motion and good stability and symmetric strength and tone of shoulders, elbows, wrist, hip, and ankles bilaterally. Significant osteophytic changes of multiple joints.  Shoulder: Left Atrophy noted of the posterior musculature of the shoulder Palpation reveals diffuse tenderness Decreased range of motion in all planes Rotator cuff strength 4 out of 5 Positive impingement Speeds and Yergason's tests normal. Positive labral pathology Normal scapular function observed. Positive painful arc and drop arm sign No apprehension sign Contralateral shoulder unremarkable   Procedure: Real-time Ultrasound Guided  Injection of left glenohumeral joint Device: GE Logiq E  Ultrasound guided injection is preferred based studies that show increased duration, increased effect, greater accuracy, decreased procedural pain, increased response rate with ultrasound guided versus blind injection.  Verbal informed consent obtained.  Time-out conducted.   Noted no overlying erythema, induration, or other signs of local infection.  Skin prepped in a sterile fashion.  Local anesthesia: Topical Ethyl chloride.  With sterile technique and under real time ultrasound guidance:  Joint visualized.  21g 2 inch needle inserted posterior approach. Pictures taken for needle placement. Patient did have injection of 2 cc of 0.5% Marcaine, and 1cc of Kenalog 40 mg/dL. Completed without difficulty  Pain immediately resolved suggesting accurate placement of the medication.  Advised to call if fevers/chills, erythema, induration, drainage, or persistent bleeding.  Images permanently stored and available for review in the ultrasound unit.  Impression: Technically successful ultrasound guided injection.   Impression and Recommendations:     This case required medical decision making of moderate complexity.

## 2016-08-07 ENCOUNTER — Ambulatory Visit (INDEPENDENT_AMBULATORY_CARE_PROVIDER_SITE_OTHER): Payer: Medicare Other | Admitting: Internal Medicine

## 2016-08-07 ENCOUNTER — Ambulatory Visit: Payer: Self-pay

## 2016-08-07 ENCOUNTER — Encounter: Payer: Self-pay | Admitting: Family Medicine

## 2016-08-07 ENCOUNTER — Encounter: Payer: Self-pay | Admitting: Internal Medicine

## 2016-08-07 ENCOUNTER — Ambulatory Visit (INDEPENDENT_AMBULATORY_CARE_PROVIDER_SITE_OTHER): Payer: Medicare Other | Admitting: Family Medicine

## 2016-08-07 VITALS — BP 126/64 | HR 65 | Wt 176.0 lb

## 2016-08-07 DIAGNOSIS — E538 Deficiency of other specified B group vitamins: Secondary | ICD-10-CM

## 2016-08-07 DIAGNOSIS — M12812 Other specific arthropathies, not elsewhere classified, left shoulder: Secondary | ICD-10-CM

## 2016-08-07 DIAGNOSIS — R634 Abnormal weight loss: Secondary | ICD-10-CM | POA: Diagnosis not present

## 2016-08-07 DIAGNOSIS — M25512 Pain in left shoulder: Principal | ICD-10-CM

## 2016-08-07 DIAGNOSIS — G8929 Other chronic pain: Secondary | ICD-10-CM

## 2016-08-07 DIAGNOSIS — M75102 Unspecified rotator cuff tear or rupture of left shoulder, not specified as traumatic: Secondary | ICD-10-CM

## 2016-08-07 DIAGNOSIS — E034 Atrophy of thyroid (acquired): Secondary | ICD-10-CM | POA: Diagnosis not present

## 2016-08-07 DIAGNOSIS — I10 Essential (primary) hypertension: Secondary | ICD-10-CM | POA: Diagnosis not present

## 2016-08-07 DIAGNOSIS — R270 Ataxia, unspecified: Secondary | ICD-10-CM

## 2016-08-07 MED ORDER — OMEPRAZOLE 40 MG PO CPDR: 40.0000 mg | DELAYED_RELEASE_CAPSULE | Freq: Every day | ORAL | 3 refills | Status: DC

## 2016-08-07 MED ORDER — ZOSTER VAC RECOMB ADJUVANTED 50 MCG/0.5ML IM SUSR: 0.5000 mL | Freq: Once | INTRAMUSCULAR | 1 refills | Status: AC

## 2016-08-07 MED ORDER — METOPROLOL SUCCINATE ER 25 MG PO TB24: 12.5000 mg | ORAL_TABLET | Freq: Every day | ORAL | 3 refills | Status: DC

## 2016-08-07 NOTE — Patient Instructions (Addendum)
Good to see you Barry Molina is your friend.   You know the drill and wish I had the magic wand.  See you again when you need me.

## 2016-08-07 NOTE — Progress Notes (Signed)
Subjective:  Patient ID: Barry Molina, male    DOB: 1925/05/04  Age: 83 y.o. MRN: [id number]  CC: No chief complaint on file.   HPI Barry Molina presents for gait disorder, CAD, HTN, OA f/u  Outpatient Medications Prior to Visit  Medication Sig Dispense Refill  . acetaminophen (TYLENOL) 325 MG tablet Take 650 mg by mouth 4 (four) times daily. Take ever day per daughter    . Cholecalciferol (VITAMIN D3) 1000 UNITS CAPS Take 1,000 Units by mouth daily.     . cyanocobalamin 1000 MCG tablet Take 1,000 mcg by mouth daily.    . fluticasone (FLONASE) 50 MCG/ACT nasal spray USE 1 SPRAY IN EACH NOSTRIL DAILY AS NEEDED (Patient taking differently: USE 1 SPRAY IN EACH NOSTRIL DAILY AS NEEDED FOR ALLERGIES) 16 g 1  . furosemide (LASIX) 20 MG tablet TAKE ONE TO TWO TABLETS BY MOUTH ONCE DAILY AS NEEDED FOR  EDEMA 180 tablet 0  . levothyroxine (SYNTHROID, LEVOTHROID) 125 MCG tablet TAKE ONE TABLET BY MOUTH ONCE DAILY 90 tablet 3  . metoprolol succinate (TOPROL-XL) 25 MG 24 hr tablet TAKE ONE-HALF TABLET BY MOUTH ONCE DAILY 45 tablet 3  . omeprazole (PRILOSEC) 40 MG capsule TAKE ONE CAPSULE BY MOUTH ONCE DAILY 90 capsule 3  . traMADol (ULTRAM) 50 MG tablet TAKE ONE TO TWO TABLETS BY MOUTH EVERY 12 HOURS AS NEEDED FOR  SEVERE  PAIN 120 tablet 3  . Vitamin D, Ergocalciferol, (DRISDOL) 50000 units CAPS capsule Take 50,000 Units by mouth every 7 (seven) days. Take on fridays    . Vitamin D, Ergocalciferol, (DRISDOL) 50000 units CAPS capsule TAKE ONE CAPSULE BY MOUTH ONCE A WEEK 12 capsule 0   No facility-administered medications prior to visit.     ROS Review of Systems  Constitutional: Negative for appetite change, fatigue and unexpected weight change.  HENT: Negative for congestion, nosebleeds, sneezing, sore throat and trouble swallowing.   Eyes: Negative for itching and visual disturbance.  Respiratory: Negative for cough.   Cardiovascular: Negative for chest pain, palpitations and leg  swelling.  Gastrointestinal: Negative for abdominal distention, blood in stool, diarrhea and nausea.  Genitourinary: Negative for frequency and hematuria.  Musculoskeletal: Positive for arthralgias and gait problem. Negative for back pain, joint swelling and neck pain.  Skin: Negative for rash.  Neurological: Positive for weakness. Negative for dizziness, tremors and speech difficulty.  Psychiatric/Behavioral: Negative for agitation, dysphoric mood and sleep disturbance. The patient is not nervous/anxious.     Objective:  BP 128/76 (BP Location: Left Arm, Patient Position: Sitting, Cuff Size: Large)   Pulse 76   Temp 98.7 F (37.1 C) (Oral)   Ht 5\' 11"  (1.803 m)   Wt 181 lb (82.1 kg)   SpO2 96%   BMI 25.24 kg/m   BP Readings from Last 3 Encounters:  08/07/16 128/76  05/08/16 126/64  04/05/16 131/80    Wt Readings from Last 3 Encounters:  08/07/16 181 lb (82.1 kg)  05/08/16 176 lb (79.8 kg)  04/05/16 178 lb 12.7 oz (81.1 kg)    Physical Exam  Constitutional: He is oriented to person, place, and time. He appears well-developed and well-nourished. No distress.  HENT:  Head: Normocephalic and atraumatic.  Right Ear: External ear normal.  Left Ear: External ear normal.  Nose: Nose normal.  Mouth/Throat: Oropharynx is clear and moist. No oropharyngeal exudate.  Eyes: Conjunctivae and EOM are normal. Pupils are equal, round, and reactive to light. Right eye exhibits no discharge. Left eye  exhibits no discharge. No scleral icterus.  Neck: Normal range of motion. Neck supple. No JVD present. No tracheal deviation present. No thyromegaly present.  Cardiovascular: Normal rate, regular rhythm, normal heart sounds and intact distal pulses.  Exam reveals no gallop and no friction rub.   No murmur heard. Pulmonary/Chest: Effort normal and breath sounds normal. No stridor. No respiratory distress. He has no wheezes. He has no rales. He exhibits no tenderness.  Abdominal: Soft. Bowel  sounds are normal. He exhibits no distension and no mass. There is no tenderness. There is no rebound and no guarding.  Genitourinary: Rectum normal, prostate normal and penis normal. Rectal exam shows guaiac negative stool. No penile tenderness.  Musculoskeletal: Normal range of motion. He exhibits tenderness. He exhibits no edema.  Lymphadenopathy:    He has no cervical adenopathy.  Neurological: He is alert and oriented to person, place, and time. He has normal reflexes. No cranial nerve deficit. He exhibits normal muscle tone. Coordination abnormal.  Skin: Skin is warm and dry. No rash noted. He is not diaphoretic. No erythema. No pallor.  Psychiatric: He has a normal mood and affect. His behavior is normal. Judgment and thought content normal.  walker Alert, cooperative   Lab Results  Component Value Date   WBC 8.8 04/04/2016   HGB 13.9 04/04/2016   HCT 41.4 04/04/2016   PLT 198 04/04/2016   GLUCOSE 91 05/08/2016   CHOL 223 (H) 03/31/2015   TRIG 125.0 03/31/2015   HDL 40.00 03/31/2015   LDLDIRECT 189.0 01/28/2009   LDLCALC 158 (H) 03/31/2015   ALT 17 04/02/2016   AST 46 (H) 04/02/2016   NA 142 05/08/2016   K 3.8 05/08/2016   CL 105 05/08/2016   CREATININE 0.97 05/08/2016   BUN 19 05/08/2016   CO2 29 05/08/2016   TSH 2.31 11/03/2015   PSA 2.77 03/31/2015   INR 1.17 04/06/2015    Ct Chest Wo Contrast  Result Date: 04/03/2016 CLINICAL DATA:  Shortness of breath. Follow-up abnormal chest radiograph. EXAM: CT CHEST WITHOUT CONTRAST TECHNIQUE: Multidetector CT imaging of the chest was performed following the standard protocol without IV contrast. COMPARISON:  Chest radiograph April 02, 2016 at 1614 hours FINDINGS: Cardiovascular: The heart is mildly enlarged. Status post CABG. Severe coronary artery calcifications/ stents. No pericardial effusions. Ascending aorta is 4.8 cm in transaxial dimension. Moderate calcific atherosclerosis of the aortic arch. Mediastinum/Nodes: No  mediastinal lymphadenopathy, limited by noncontrast CT. Normal appearance of the thoracic esophagus the not tailored for evaluation. Lungs/Pleura: Slight RIGHT apical fibronodular scarring. Minimal scarring lingula. LEFT lower lobe atelectasis and elevated LEFT hemidiaphragm. Trace RIGHT middle lobe atelectasis. No pleural effusion or focal consolidation. Tracheobronchial tree is patent and midline. No pneumothorax. No RIGHT middle lobe mass, chest radiograph findings reflect RIGHT hilar vessels. Upper Abdomen: Nonobstructing 4 mm LEFT nephrolithiasis. Calcified granuloma LEFT lobe of the liver. Musculoskeletal: Subcentimeter coarse calcification LEFT thyroid. Severe degenerative change of LEFT shoulder, mild with multiple surgical staples. Severe degenerative change of the RIGHT shoulder, with suture anchor. High-riding RIGHT humeral head. Bridging ventral thoracic spine osteophytes. IMPRESSION: Atelectasis. No acute pulmonary process. No RIGHT middle lobe mass or consolidation, chest radiograph findings correspond to RIGHT hilar vessels. Mild cardiomegaly . **An incidental finding of potential clinical significance has been found. 4.7 cm ascending aortic aneurysm. Recommend semi-annual imaging followup by CTA or MRA and referral to cardiothoracic surgery if not already obtained. This recommendation follows 2010 ACCF/AHA/AATS/ACR/ASA/SCA/SCAI/SIR/STS/SVM Guidelines for the Diagnosis and Management of Patients With Thoracic Aortic  Disease. Circulation. 2010; 121: Z001-V494** Electronically Signed   By: Elon Alas M.D.   On: 04/03/2016 00:03   Dg Chest Port 1 View  Result Date: 04/02/2016 CLINICAL DATA:  Encephalopathy acute. Pt has no chest complaints at this time. Hx CAD, HTN, nonsmoker. EXAM: PORTABLE CHEST 1 VIEW COMPARISON:  04/06/2015 FINDINGS: Status post median sternotomy and CABG. Heart is enlarged. There is elevation of left hemidiaphragm which is stable. Shallow lung inflation and slight patient  rotation. There is retrocardiac density on the right. Although this may represent hilar vessels accentuated by the shallow lung inflation infiltrate or mass for difficult to exclude. There is prominence of interstitial markings at the base likely indicating mild interstitial edema. IMPRESSION: 1. Shallow inflation and patient rotation. 2. Question of mass or infiltrate in the medial right lung base. Further evaluation is warranted. Consider PA and lateral chest x-ray if the patient is able. Alternatively, chest CT may be needed. Electronically Signed   By: Nolon Nations M.D.   On: 04/02/2016 16:40   Dg Knee Complete 4 Views Right  Result Date: 04/02/2016 CLINICAL DATA:  Pain after fall. EXAM: RIGHT KNEE - COMPLETE 4+ VIEW COMPARISON:  None. FINDINGS: Patellar fracture. Large joint effusion. Linear interface between the effusion and overlying fat is favored to be due to something on the patient as the linearity extends inferior to the knee. No convincing evidence of tibial plateau fracture. No other fractures. IMPRESSION: 1. Patellar fracture. 2. Large joint effusion. The linear interface between the effusion and adjacent fat is thought to be artifactual as described above. No visualized tibial plateau fracture. If there is high concern for tibial plateau fracture, a repeat lateral view could be obtained without anything on the patient. Electronically Signed   By: Dorise Bullion III M.D   On: 04/02/2016 12:17    Assessment & Plan:   There are no diagnoses linked to this encounter. I am having Mr. Lanuza maintain his Vitamin D3, fluticasone, cyanocobalamin, omeprazole, metoprolol succinate, acetaminophen, Vitamin D (Ergocalciferol), levothyroxine, Vitamin D (Ergocalciferol), traMADol, and furosemide.  No orders of the defined types were placed in this encounter.    Follow-up: No Follow-up on file.  Walker Kehr, MD

## 2016-08-07 NOTE — Assessment & Plan Note (Signed)
oing well Wt Readings from Last 3 Encounters:  08/07/16 181 lb (82.1 kg)  05/08/16 176 lb (79.8 kg)  04/05/16 178 lb 12.7 oz (81.1 kg)

## 2016-08-07 NOTE — Assessment & Plan Note (Signed)
Toprol  BP Readings from Last 3 Encounters:  08/07/16 128/76  05/08/16 126/64  04/05/16 131/80

## 2016-08-07 NOTE — Assessment & Plan Note (Signed)
Worsening symptoms again. Discussed with patient at great length. Patient does respond well to the injections. Patient was given another injection today. Refill patient vitamin D. Given trial topical anti-inflammatories a try. We discussed icing regimen and home exercises. Patient does have tramadol for breakthrough pain which was refilled as well. Follow-up again in 10 weeks.

## 2016-08-07 NOTE — Assessment & Plan Note (Signed)
Walker

## 2016-08-07 NOTE — Assessment & Plan Note (Signed)
On Levothroid 

## 2016-08-07 NOTE — Assessment & Plan Note (Signed)
On B12 

## 2016-08-11 ENCOUNTER — Ambulatory Visit: Payer: Medicare Other | Admitting: Family Medicine

## 2016-10-05 ENCOUNTER — Other Ambulatory Visit: Payer: Self-pay

## 2016-10-05 MED ORDER — VITAMIN D (ERGOCALCIFEROL) 1.25 MG (50000 UNIT) PO CAPS: 50000.0000 [IU] | ORAL_CAPSULE | ORAL | 0 refills | Status: DC

## 2016-11-11 ENCOUNTER — Other Ambulatory Visit: Payer: Self-pay | Admitting: Family Medicine

## 2016-12-11 ENCOUNTER — Ambulatory Visit: Payer: Medicare Other | Admitting: Internal Medicine

## 2016-12-18 ENCOUNTER — Encounter: Payer: Self-pay | Admitting: Internal Medicine

## 2016-12-18 ENCOUNTER — Other Ambulatory Visit (INDEPENDENT_AMBULATORY_CARE_PROVIDER_SITE_OTHER): Payer: Medicare Other

## 2016-12-18 ENCOUNTER — Ambulatory Visit (INDEPENDENT_AMBULATORY_CARE_PROVIDER_SITE_OTHER): Payer: Medicare Other | Admitting: Internal Medicine

## 2016-12-18 VITALS — BP 126/78 | HR 69 | Temp 98.8°F | Ht 71.0 in | Wt 195.0 lb

## 2016-12-18 DIAGNOSIS — I10 Essential (primary) hypertension: Secondary | ICD-10-CM

## 2016-12-18 DIAGNOSIS — E538 Deficiency of other specified B group vitamins: Secondary | ICD-10-CM | POA: Diagnosis not present

## 2016-12-18 DIAGNOSIS — Z23 Encounter for immunization: Secondary | ICD-10-CM

## 2016-12-18 DIAGNOSIS — R270 Ataxia, unspecified: Secondary | ICD-10-CM

## 2016-12-18 DIAGNOSIS — I2510 Atherosclerotic heart disease of native coronary artery without angina pectoris: Secondary | ICD-10-CM

## 2016-12-18 DIAGNOSIS — E034 Atrophy of thyroid (acquired): Secondary | ICD-10-CM

## 2016-12-18 DIAGNOSIS — R634 Abnormal weight loss: Secondary | ICD-10-CM

## 2016-12-18 LAB — TSH: TSH: 3.59 u[IU]/mL (ref 0.35–4.50)

## 2016-12-18 LAB — HEPATIC FUNCTION PANEL
ALT: 10 U/L (ref 0–53)
AST: 18 U/L (ref 0–37)
Albumin: 3.9 g/dL (ref 3.5–5.2)
Alkaline Phosphatase: 57 U/L (ref 39–117)
BILIRUBIN DIRECT: 0.1 mg/dL (ref 0.0–0.3)
BILIRUBIN TOTAL: 0.6 mg/dL (ref 0.2–1.2)
Total Protein: 6.6 g/dL (ref 6.0–8.3)

## 2016-12-18 LAB — BASIC METABOLIC PANEL
BUN: 23 mg/dL (ref 6–23)
CALCIUM: 9.4 mg/dL (ref 8.4–10.5)
CO2: 26 mEq/L (ref 19–32)
Chloride: 106 mEq/L (ref 96–112)
Creatinine, Ser: 1.05 mg/dL (ref 0.40–1.50)
GFR: 70.56 mL/min (ref >60.00)
GLUCOSE: 106 mg/dL — AB (ref 70–99)
POTASSIUM: 4.1 meq/L (ref 3.5–5.1)
SODIUM: 143 meq/L (ref 135–145)

## 2016-12-18 LAB — VITAMIN B12: VITAMIN B 12: 763 pg/mL (ref 211–911)

## 2016-12-18 MED ORDER — ZOSTER VAC RECOMB ADJUVANTED 50 MCG/0.5ML IM SUSR: 0.5000 mL | Freq: Once | INTRAMUSCULAR | 1 refills | Status: AC

## 2016-12-18 NOTE — Assessment & Plan Note (Signed)
Levothroid 

## 2016-12-18 NOTE — Assessment & Plan Note (Signed)
Toprol, ASA 

## 2016-12-18 NOTE — Patient Instructions (Signed)
Well w/Jill 

## 2016-12-18 NOTE — Assessment & Plan Note (Signed)
Walker  Aid at home 6 h/d

## 2016-12-18 NOTE — Assessment & Plan Note (Signed)
Wt Readings from Last 3 Encounters:  12/18/16 195 lb (88.5 kg)  08/07/16 176 lb (79.8 kg)  08/07/16 181 lb (82.1 kg)

## 2016-12-18 NOTE — Progress Notes (Signed)
Subjective:  Patient ID: Barry Molina, male    DOB: 27-Dec-1925  Age: 83 y.o. MRN: [id number]  CC: No chief complaint on file.   HPI Barry Molina presents for CAD, OA, dementia, hypothyroidism f/u  Outpatient Medications Prior to Visit  Medication Sig Dispense Refill  . acetaminophen (TYLENOL) 325 MG tablet Take 650 mg by mouth 4 (four) times daily. Take ever day per daughter    . Cholecalciferol (VITAMIN D3) 1000 UNITS CAPS Take 1,000 Units by mouth daily.     . cyanocobalamin 1000 MCG tablet Take 1,000 mcg by mouth daily.    . fluticasone (FLONASE) 50 MCG/ACT nasal spray USE 1 SPRAY IN EACH NOSTRIL DAILY AS NEEDED (Patient taking differently: USE 1 SPRAY IN EACH NOSTRIL DAILY AS NEEDED FOR ALLERGIES) 16 g 1  . furosemide (LASIX) 20 MG tablet TAKE ONE TO TWO TABLETS BY MOUTH ONCE DAILY AS NEEDED FOR  EDEMA 180 tablet 0  . levothyroxine (SYNTHROID, LEVOTHROID) 125 MCG tablet TAKE ONE TABLET BY MOUTH ONCE DAILY 90 tablet 3  . metoprolol succinate (TOPROL-XL) 25 MG 24 hr tablet Take 0.5 tablets (12.5 mg total) by mouth daily. 45 tablet 3  . omeprazole (PRILOSEC) 40 MG capsule Take 1 capsule (40 mg total) by mouth daily. 90 capsule 3  . traMADol (ULTRAM) 50 MG tablet TAKE 1-2 TABS EVERY 12 HOURS AS NEEDED FOR SEVERE PAIN 120 tablet 3  . Vitamin D, Ergocalciferol, (DRISDOL) 50000 units CAPS capsule Take 1 capsule (50,000 Units total) by mouth once a week. 12 capsule 0   No facility-administered medications prior to visit.     ROS Review of Systems  Constitutional: Negative for appetite change, fatigue and unexpected weight change.  HENT: Negative for congestion, nosebleeds, sneezing, sore throat and trouble swallowing.   Eyes: Negative for itching and visual disturbance.  Respiratory: Negative for cough, chest tightness and shortness of breath.   Cardiovascular: Negative for chest pain, palpitations and leg swelling.  Gastrointestinal: Negative for abdominal distention, blood in  stool, diarrhea and nausea.  Genitourinary: Negative for frequency and hematuria.  Musculoskeletal: Positive for arthralgias and gait problem. Negative for back pain, joint swelling and neck pain.  Skin: Negative for rash.  Neurological: Negative for dizziness, tremors, speech difficulty and weakness.  Psychiatric/Behavioral: Negative for agitation, dysphoric mood and sleep disturbance. The patient is not nervous/anxious.     Objective:  BP 126/78 (BP Location: Left Arm, Patient Position: Sitting, Cuff Size: Large)   Pulse 69   Temp 98.8 F (37.1 C) (Oral)   Ht 5\' 11"  (1.803 m)   Wt 195 lb (88.5 kg)   SpO2 98%   BMI 27.20 kg/m   BP Readings from Last 3 Encounters:  12/18/16 126/78  08/07/16 126/64  08/07/16 128/76    Wt Readings from Last 3 Encounters:  12/18/16 195 lb (88.5 kg)  08/07/16 176 lb (79.8 kg)  08/07/16 181 lb (82.1 kg)    Physical Exam  Constitutional: He is oriented to person, place, and time. He appears well-developed. No distress.  NAD  HENT:  Mouth/Throat: Oropharynx is clear and moist.  Eyes: Pupils are equal, round, and reactive to light. Conjunctivae are normal.  Neck: Normal range of motion. No JVD present. No thyromegaly present.  Cardiovascular: Normal rate, regular rhythm, normal heart sounds and intact distal pulses.  Exam reveals no gallop and no friction rub.   No murmur heard. Pulmonary/Chest: Effort normal and breath sounds normal. No respiratory distress. He has no wheezes. He has  no rales. He exhibits no tenderness.  Abdominal: Soft. Bowel sounds are normal. He exhibits no distension and no mass. There is no tenderness. There is no rebound and no guarding.  Musculoskeletal: Normal range of motion. He exhibits no edema or tenderness.  Lymphadenopathy:    He has no cervical adenopathy.  Neurological: He is alert and oriented to person, place, and time. He has normal reflexes. No cranial nerve deficit. He exhibits normal muscle tone. He  displays a negative Romberg sign. Coordination abnormal. Gait normal.  Skin: Skin is warm and dry. No rash noted.  Psychiatric: He has a normal mood and affect. His behavior is normal. Judgment and thought content normal.  walker Alert/cooperative  Lab Results  Component Value Date   WBC 8.8 04/04/2016   HGB 13.9 04/04/2016   HCT 41.4 04/04/2016   PLT 198 04/04/2016   GLUCOSE 91 05/08/2016   CHOL 223 (H) 03/31/2015   TRIG 125.0 03/31/2015   HDL 40.00 03/31/2015   LDLDIRECT 189.0 01/28/2009   LDLCALC 158 (H) 03/31/2015   ALT 17 04/02/2016   AST 46 (H) 04/02/2016   NA 142 05/08/2016   K 3.8 05/08/2016   CL 105 05/08/2016   CREATININE 0.97 05/08/2016   BUN 19 05/08/2016   CO2 29 05/08/2016   TSH 2.31 11/03/2015   PSA 2.77 03/31/2015   INR 1.17 04/06/2015    Ct Chest Wo Contrast  Result Date: 04/03/2016 CLINICAL DATA:  Shortness of breath. Follow-up abnormal chest radiograph. EXAM: CT CHEST WITHOUT CONTRAST TECHNIQUE: Multidetector CT imaging of the chest was performed following the standard protocol without IV contrast. COMPARISON:  Chest radiograph April 02, 2016 at 1614 hours FINDINGS: Cardiovascular: The heart is mildly enlarged. Status post CABG. Severe coronary artery calcifications/ stents. No pericardial effusions. Ascending aorta is 4.8 cm in transaxial dimension. Moderate calcific atherosclerosis of the aortic arch. Mediastinum/Nodes: No mediastinal lymphadenopathy, limited by noncontrast CT. Normal appearance of the thoracic esophagus the not tailored for evaluation. Lungs/Pleura: Slight RIGHT apical fibronodular scarring. Minimal scarring lingula. LEFT lower lobe atelectasis and elevated LEFT hemidiaphragm. Trace RIGHT middle lobe atelectasis. No pleural effusion or focal consolidation. Tracheobronchial tree is patent and midline. No pneumothorax. No RIGHT middle lobe mass, chest radiograph findings reflect RIGHT hilar vessels. Upper Abdomen: Nonobstructing 4 mm LEFT  nephrolithiasis. Calcified granuloma LEFT lobe of the liver. Musculoskeletal: Subcentimeter coarse calcification LEFT thyroid. Severe degenerative change of LEFT shoulder, mild with multiple surgical staples. Severe degenerative change of the RIGHT shoulder, with suture anchor. High-riding RIGHT humeral head. Bridging ventral thoracic spine osteophytes. IMPRESSION: Atelectasis. No acute pulmonary process. No RIGHT middle lobe mass or consolidation, chest radiograph findings correspond to RIGHT hilar vessels. Mild cardiomegaly . **An incidental finding of potential clinical significance has been found. 4.7 cm ascending aortic aneurysm. Recommend semi-annual imaging followup by CTA or MRA and referral to cardiothoracic surgery if not already obtained. This recommendation follows 2010 ACCF/AHA/AATS/ACR/ASA/SCA/SCAI/SIR/STS/SVM Guidelines for the Diagnosis and Management of Patients With Thoracic Aortic Disease. Circulation. 2010; 121: F751-W258** Electronically Signed   By: Elon Alas M.D.   On: 04/03/2016 00:03   Dg Chest Port 1 View  Result Date: 04/02/2016 CLINICAL DATA:  Encephalopathy acute. Pt has no chest complaints at this time. Hx CAD, HTN, nonsmoker. EXAM: PORTABLE CHEST 1 VIEW COMPARISON:  04/06/2015 FINDINGS: Status post median sternotomy and CABG. Heart is enlarged. There is elevation of left hemidiaphragm which is stable. Shallow lung inflation and slight patient rotation. There is retrocardiac density on the right. Although this  may represent hilar vessels accentuated by the shallow lung inflation infiltrate or mass for difficult to exclude. There is prominence of interstitial markings at the base likely indicating mild interstitial edema. IMPRESSION: 1. Shallow inflation and patient rotation. 2. Question of mass or infiltrate in the medial right lung base. Further evaluation is warranted. Consider PA and lateral chest x-ray if the patient is able. Alternatively, chest CT may be needed.  Electronically Signed   By: Nolon Nations M.D.   On: 04/02/2016 16:40   Dg Knee Complete 4 Views Right  Result Date: 04/02/2016 CLINICAL DATA:  Pain after fall. EXAM: RIGHT KNEE - COMPLETE 4+ VIEW COMPARISON:  None. FINDINGS: Patellar fracture. Large joint effusion. Linear interface between the effusion and overlying fat is favored to be due to something on the patient as the linearity extends inferior to the knee. No convincing evidence of tibial plateau fracture. No other fractures. IMPRESSION: 1. Patellar fracture. 2. Large joint effusion. The linear interface between the effusion and adjacent fat is thought to be artifactual as described above. No visualized tibial plateau fracture. If there is high concern for tibial plateau fracture, a repeat lateral view could be obtained without anything on the patient. Electronically Signed   By: Dorise Bullion III M.D   On: 04/02/2016 12:17    Assessment & Plan:   There are no diagnoses linked to this encounter. I am having Mr. Lanigan maintain his Vitamin D3, fluticasone, cyanocobalamin, acetaminophen, levothyroxine, furosemide, metoprolol succinate, omeprazole, Vitamin D (Ergocalciferol), and traMADol.  No orders of the defined types were placed in this encounter.    Follow-up: No Follow-up on file.  Walker Kehr, MD

## 2016-12-18 NOTE — Assessment & Plan Note (Signed)
Toprol Furosemide prn

## 2016-12-18 NOTE — Assessment & Plan Note (Signed)
On B12 

## 2016-12-30 ENCOUNTER — Other Ambulatory Visit: Payer: Self-pay | Admitting: Family Medicine

## 2017-01-26 ENCOUNTER — Other Ambulatory Visit: Payer: Self-pay | Admitting: Internal Medicine

## 2017-02-22 IMAGING — MR MR HEAD WO/W CM
9 of 12 series · 33 of 48 positions shown · IV contrast (multihance)
Comparison: CT head April 05, 2015 and CT head May 28, 2013 and
December 06, 2012

CLINICAL DATA: Altered mental status and worsening confusion.
Possible stroke. History of hypertension, hyperlipidemia, melanoma.

EXAM:
MRI HEAD WITHOUT AND WITH CONTRAST
TECHNIQUE: Multiplanar, multiecho pulse sequences of the brain and surrounding
structures were obtained without and with intravenous contrast.
CONTRAST:  20 cc MultiHance

[Series 4: T1 · sagittal · 5.0mm · 0.47mm/px · 3 of 25 slices shown]
[im 1/25]
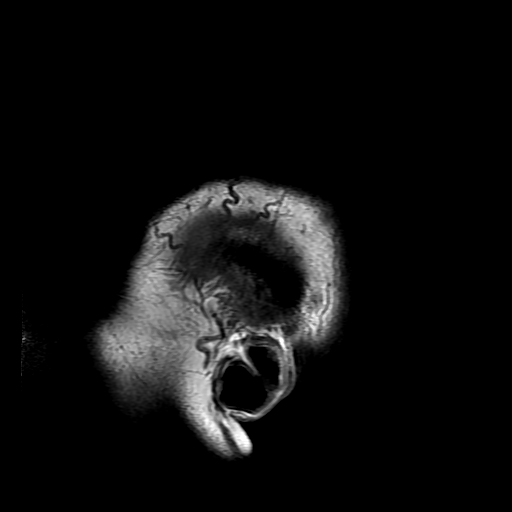
[im 13/25]
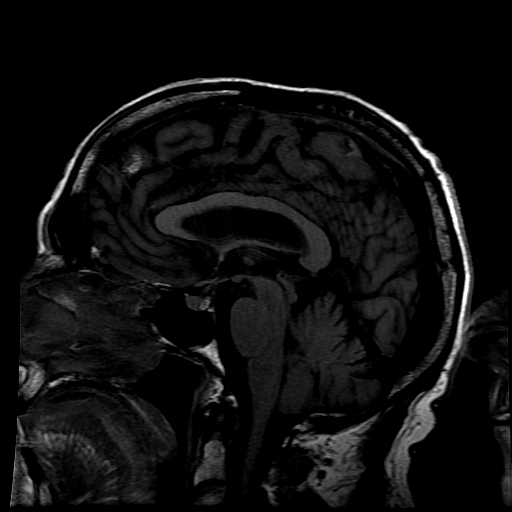
[im 25/25]
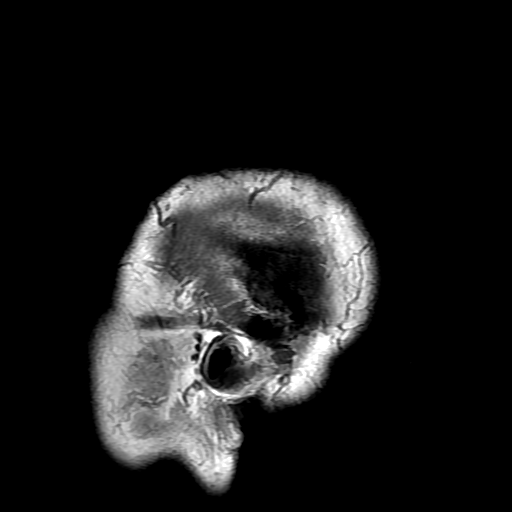

[Series 5: DWI · axial · 3.0mm · 1.09mm/px · z∈[-62,+84]mm · 8 of 102 slices shown (1 of 4)]
[im 1/102]
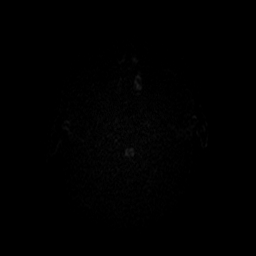
[im 15/102]
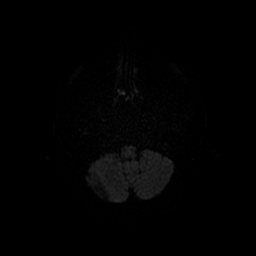
[im 29/102]
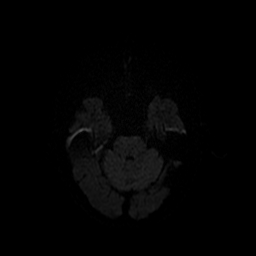
[im 44/102]
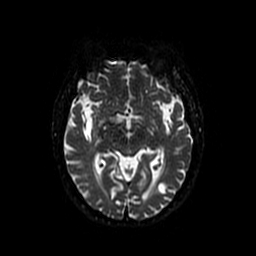
[im 58/102]
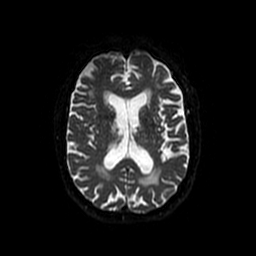
[im 73/102]
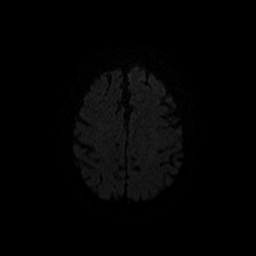
[im 87/102]
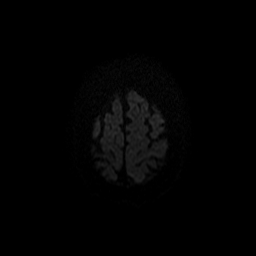
[im 102/102]
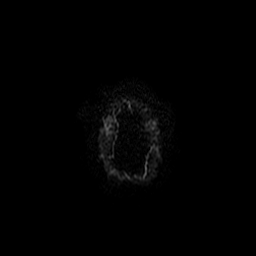

[Series 6: T2 · axial · 5.0mm · 0.43mm/px · z∈[-69,+77]mm · 2 of 26 slices shown (1 of 2)]
[im 1/26]
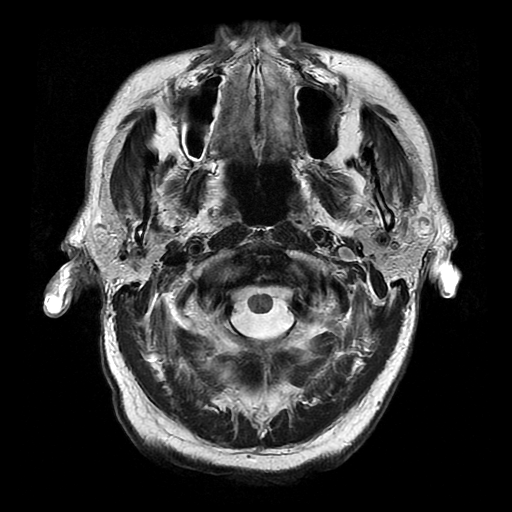
[im 26/26]
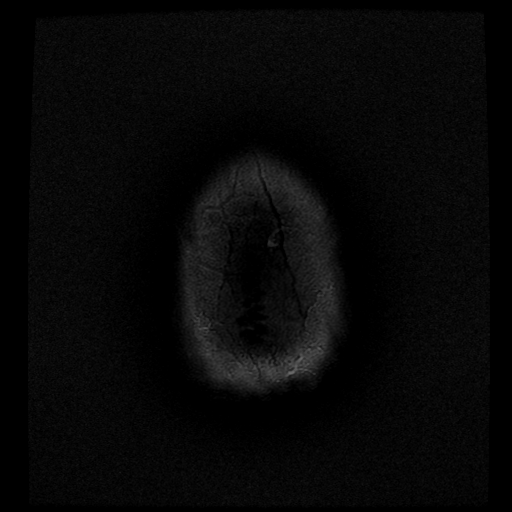

[Series 7: FLAIR · axial · 5.0mm · 0.43mm/px · z∈[-69,+77]mm · 2 of 26 slices shown]
[im 1/26]
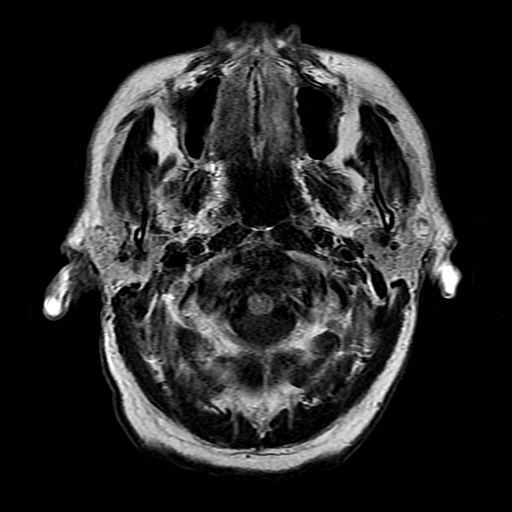
[im 26/26]
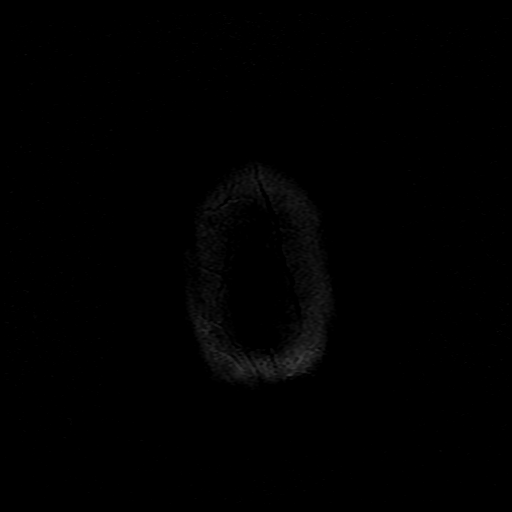

[Series 8: DWI · coronal · 5.0mm · 1.09mm/px · 6 of 74 slices shown (2 of 4)]
[im 1/74]
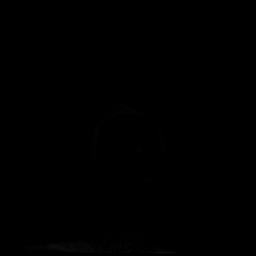
[im 15/74]
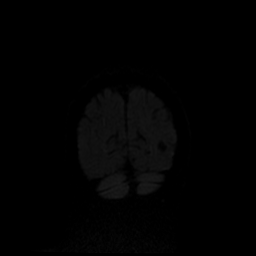
[im 30/74]
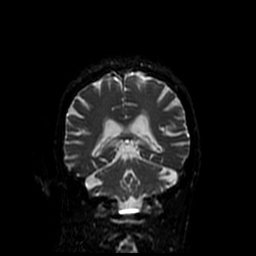
[im 44/74]
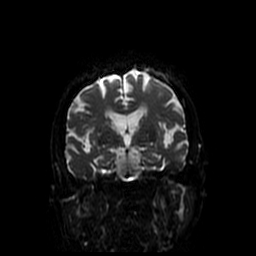
[im 59/74]
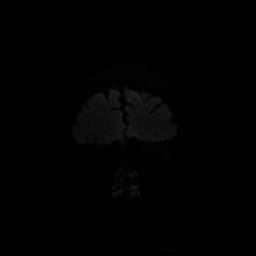
[im 74/74]
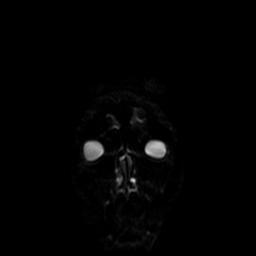

[Series 13: T1 post-contrast · coronal · 5.0mm · 0.41mm/px · 2 of 28 slices shown]
[im 1/28]
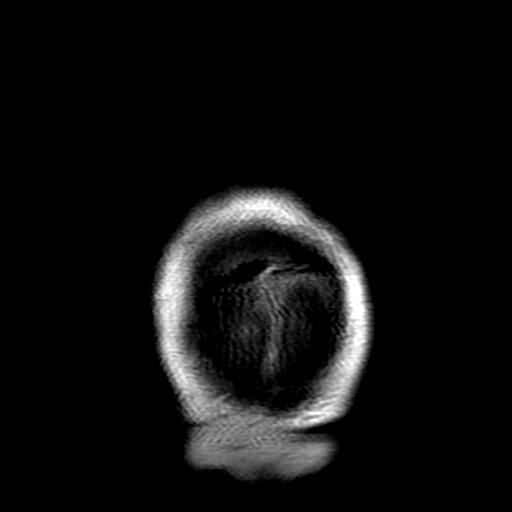
[im 28/28]
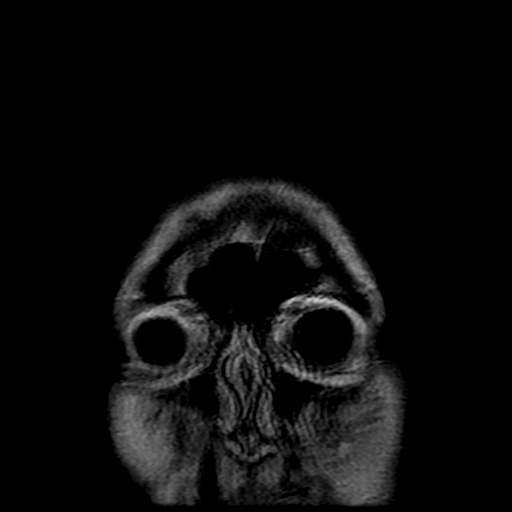

[Series 14: T2 · coronal · 5.0mm · 0.43mm/px · 3 of 31 slices shown (2 of 2)]
[im 1/31]
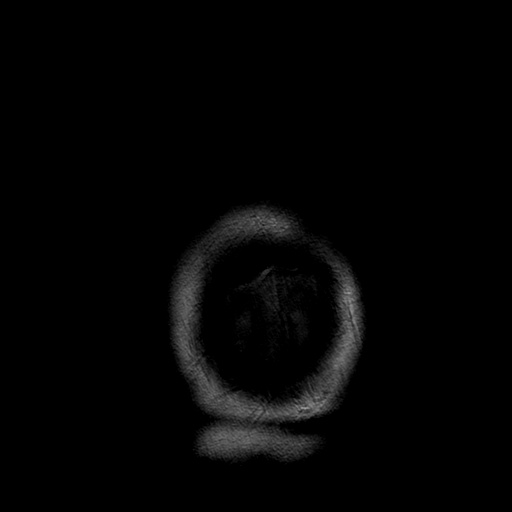
[im 16/31]
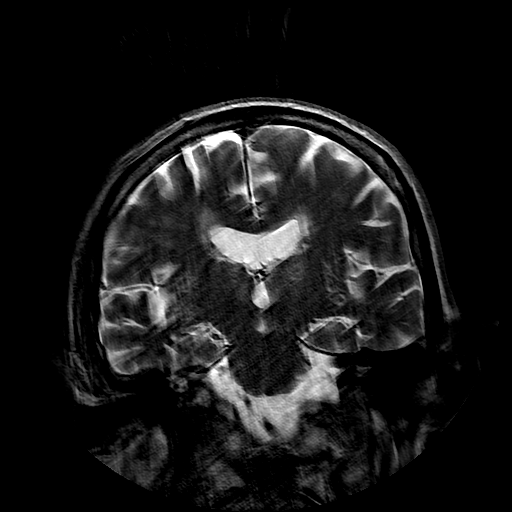
[im 31/31]
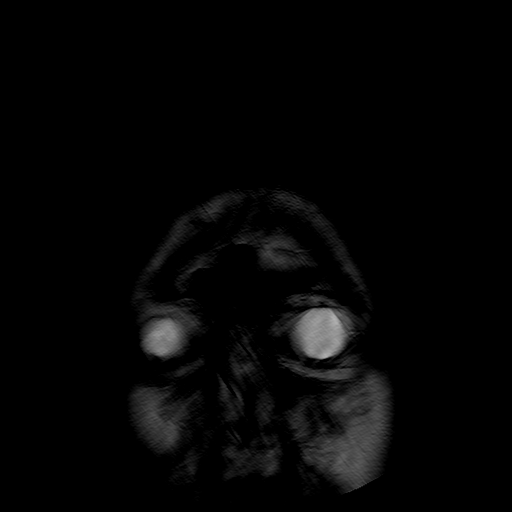

[Series 500: DWI · axial · 3.0mm · 1.09mm/px · z∈[-62,+84]mm · 4 of 51 slices shown (3 of 4)]
[im 1/51]
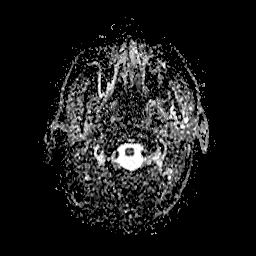
[im 17/51]
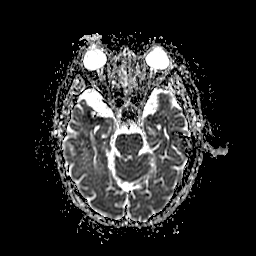
[im 34/51]
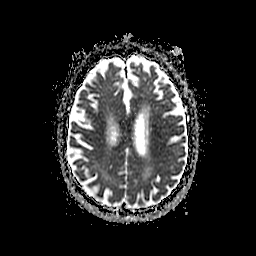
[im 51/51]
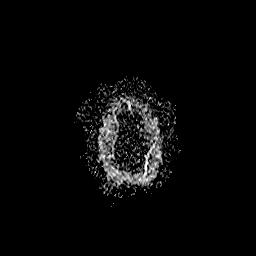

[Series 800: DWI · coronal · 5.0mm · 1.09mm/px · 3 of 37 slices shown (4 of 4)]
[im 1/37]
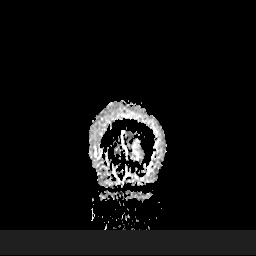
[im 19/37]
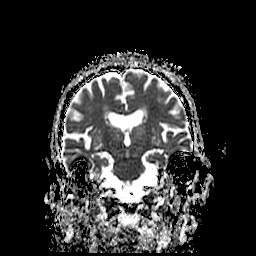
[im 37/37]
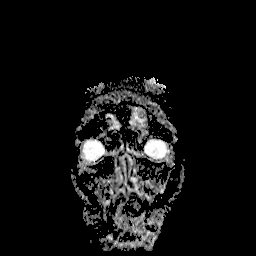

[33 of 48 positions shown; findings below may reference images not displayed]

FINDINGS: Multiple sequences are motion degraded.

The ventricles and sulci are normal for patient's age. No abnormal
parenchymal signal, mass effect. Lentiform homogeneously T2 bright
14 x 11 mm LEFT occipital lesion without enhancement is stable from
1770. Patchy to confluent supratentorial white matter FLAIR T2
hyperintensities. No abnormal parenchymal enhancement, though
coronal post gadolinium sequences severely motion degraded. No
reduced diffusion to suggest acute ischemia. No susceptibility
artifact to suggest hemorrhage. Old RIGHT caudate body lacunar
infarct. Additional bilateral basal the ganglia prominent
perivascular spaces.

No abnormal extra-axial fluid collections. No extra-axial masses nor
leptomeningeal enhancement. Normal major intracranial vascular flow
voids seen at the skull base.

Status post bilateral ocular lens implants. No suspicious calvarial
bone marrow signal. No abnormal sellar expansion. Craniocervical
junction maintained. Trace paranasal sinus mucosal thickening
without air-fluid levels. The mastoid air cells are well aerated.
IMPRESSION: No acute intracranial process on this motion degraded examination,
specifically no acute ischemia.

Stable appearance of cystic 14 x 11 mm LEFT occipital lobe probable
neuroglial cyst without suspicious features.

Involutional changes. Moderate to severe chronic small vessel
ischemic disease. Old RIGHT basal ganglia lacunar infarct.

## 2017-03-10 ENCOUNTER — Other Ambulatory Visit: Payer: Self-pay | Admitting: Family Medicine

## 2017-03-14 ENCOUNTER — Ambulatory Visit: Payer: Self-pay

## 2017-03-14 ENCOUNTER — Encounter: Payer: Self-pay | Admitting: Family Medicine

## 2017-03-14 ENCOUNTER — Ambulatory Visit: Payer: Medicare Other | Admitting: Family Medicine

## 2017-03-14 VITALS — BP 122/82 | HR 73 | Wt 195.0 lb

## 2017-03-14 DIAGNOSIS — M12812 Other specific arthropathies, not elsewhere classified, left shoulder: Secondary | ICD-10-CM

## 2017-03-14 DIAGNOSIS — G8929 Other chronic pain: Secondary | ICD-10-CM

## 2017-03-14 DIAGNOSIS — M1711 Unilateral primary osteoarthritis, right knee: Secondary | ICD-10-CM

## 2017-03-14 DIAGNOSIS — M25512 Pain in left shoulder: Principal | ICD-10-CM

## 2017-03-14 DIAGNOSIS — M75102 Unspecified rotator cuff tear or rupture of left shoulder, not specified as traumatic: Secondary | ICD-10-CM | POA: Diagnosis not present

## 2017-03-14 NOTE — Progress Notes (Signed)
[name] [address] [place], [place] [address] Phone: [phone] Subjective:    I'm seeing this patient by the request  of:    CC: Left shoulder pain, right knee pain.  [id number]  Barry Molina is a 84 y.o. male coming in with complaint of left shoulder pain.  Has seen patient multiple times.  Patient has been given an injection but has been greater than 7 months.  Has known rotator cuff arthropathy.  Patient states that the pain is worsened recently.  Affecting daily activities such as dressing.  Patient denies any radiation down the arm but does have some pain to the mid humerus.  Mild neck pain associated with it.  Right knee pain.  Known severe arthritis as well.  Bone-on-bone.  Some increasing instability and swelling.  Has responded to's.  Usually walks with the aid of a cane but now is using the aid of a walker secondary to the increasing instability.     Past Medical History:  Diagnosis Date  . B12 DEFICIENCY 12/09/2007  . Cataract   . COLONIC POLYPS, HX OF 12/14/2006  . CORONARY ARTERY BYPASS GRAFT, HX OF 08/26/2008  . CORONARY ARTERY DISEASE 12/14/2006  . Edema 08/27/2008  . ELBOW PAIN 10/06/2009  . GERD 12/14/2006  . HYPERLIPIDEMIA 12/14/2006  . HYPERTENSION 12/14/2006  . HYPOTHYROIDISM 12/14/2006  . LEG PAIN 05/10/2010  . 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 MUSCLE PAIN 04/10/2007  . NEOPLASM, SKIN, UNCERTAIN BEHAVIOR 9/37/3428  . Other specified acquired hypothyroidism 04/08/2007  . Personal history of other diseases of digestive disease 08/26/2008  . SHOULDER PAIN 12/19/2006   Past Surgical History:  Procedure Laterality Date  . BACK SURGERY  09/27/10  . CHOLECYSTECTOMY  9/12  . CORONARY ARTERY BYPASS GRAFT    . right ankle surgery    . ROTATOR CUFF REPAIR     bilateral  . SPINE SURGERY  8/12   LS spine cyst   Social History    Socioeconomic History  . Marital status: Widowed    Spouse name: Not on file  . Number of children: Not on file  . Years of education: Not on file  . Highest education level: Not on file  Social Needs  . Financial resource strain: Not on file  . Food insecurity - worry: Not on file  . Food insecurity - inability: Not on file  . Transportation needs - medical: Not on file  . Transportation needs - non-medical: Not on file  Occupational History  . Not on file  Tobacco Use  . Smoking status: Never Smoker  . Smokeless tobacco: Never Used  Substance and Sexual Activity  . Alcohol use: No    Alcohol/week: 0.0 oz  . Drug use: No  . Sexual activity: Not Currently  Other Topics Concern  . Not on file  Social History Narrative   Lost wife in Dec;   Living alone;    Children are close by; 2 dtrs live within walking distance   One level home      Stay there as long as he can   Allergies  Allergen Reactions  . Ezetimibe-Simvastatin Other (See Comments)     aches  . Morphine Nausea And Vomiting   Family History  Problem Relation Age of Onset  . Hypertension Father   . Cancer Father   . Stroke Mother 75  .  Heart disease Other      Past medical history, social, surgical and family history all reviewed in electronic medical record.  No pertanent information unless stated regarding to the chief complaint.   Review of Systems:Review of systems updated and as accurate as of 03/14/17  No headache, visual changes, nausea, vomiting, diarrhea, constipation, dizziness, abdominal pain, skin rash, fevers, chills, night sweats, weight loss, swollen lymph nodes, body aches, joint swelling, chest pain, shortness of breath, mood changes.  Positive muscle aches  Objective  Blood pressure 122/82, pulse 73, weight 195 lb (88.5 kg), SpO2 98 %. Systems examined below as of 03/14/17   General: No apparent distress alert and oriented x3 mood and affect normal, dressed appropriately.  HEENT:  Pupils equal, extraocular movements intact  Respiratory: Patient's speak in full sentences and does not appear short of breath  Cardiovascular: No lower extremity edema, non tender, no erythema  Skin: Warm dry intact with no signs of infection or rash on extremities or on axial skeleton.  Abdomen: Soft nontender  Neuro: Cranial nerves II through XII are intact, neurovascularly intact in all extremities with 2+ DTRs and 2+ pulses.  Lymph: No lymphadenopathy of posterior or anterior cervical chain or axillae bilaterally.  Gait antalgic gait MSK:  Non tender with full range of motion and good stability and symmetric strength and tone of  elbows, wrist, hip and ankles bilaterally.  Left shoulder exam shows significant crepitus.  Decreased forward flexion to 70 degrees.  Minimal external rotation.  Internal rotation to lateral hip.  Rotator cuff strength 3 out of 5.  Contralateral side has 4 out of 5 strength.  Good grip strength still noted.  Neurovascularly intact distally.  Knee: Right valgus deformity noted. L Tender to palpation over medial and PF joint line.  ROM full in flexion and extension and lower leg rotation. instability with valgus force.  painful patellar compression. Patellar glide with moderate crepitus. Patellar and quadriceps tendons unremarkable. Hamstring and quadriceps strength is normal. Contralateral knee shows moderate arthritic changes but no pain   After informed written and verbal consent, patient was seated on exam table. Right knee was prepped with alcohol swab and utilizing anterolateral approach, patient's right knee space was injected with 4:1  marcaine 0.5%: Kenalog 40mg /dL. Patient tolerated the procedure well without immediate complications.  After informed written and verbal consent, patient was seated on exam table. Left shoulder was prepped with alcohol swab and utilizing posterior approach, patient's right glenohumeral space was injected with 4:1  marcaine  0.5%: Kenalog 40mg /dL. Patient tolerated the procedure well without immediate complications.   Impression and Recommendations:     This case required medical decision making of moderate complexity.      Note: This dictation was prepared with Dragon dictation along with smaller phrase technology. Any transcriptional errors that result from this process are unintentional.

## 2017-03-14 NOTE — Assessment & Plan Note (Signed)
Injection given.  Worsening symptoms.  Encourage patient to continue to use the walker or cane at all times.  Discussed custom brace but secondary to patient's other arthritic changes likely would not be able to appreciation appropriately.  Follow-up again 10 weeks

## 2017-03-14 NOTE — Patient Instructions (Signed)
Good to see you  Ice is your friend Injected the knee and the shoulder  pennsaid pinkie amount topically 2 times daily as needed.   Keep trucking along [employer] know where I am if you need me

## 2017-03-14 NOTE — Assessment & Plan Note (Signed)
Repeat injection given today secondary to worsening.  We discussed topical anti-inflammatories, icing regimen, which activities of doing which wants to avoid.  Increase activity slowly.  Follow-up again in 10 weeks

## 2017-03-24 ENCOUNTER — Other Ambulatory Visit: Payer: Self-pay | Admitting: Family Medicine

## 2017-04-16 ENCOUNTER — Other Ambulatory Visit (INDEPENDENT_AMBULATORY_CARE_PROVIDER_SITE_OTHER): Payer: Medicare Other

## 2017-04-16 ENCOUNTER — Encounter: Payer: Self-pay | Admitting: Internal Medicine

## 2017-04-16 ENCOUNTER — Ambulatory Visit: Payer: Medicare Other | Admitting: Internal Medicine

## 2017-04-16 ENCOUNTER — Other Ambulatory Visit: Payer: Self-pay | Admitting: Family Medicine

## 2017-04-16 DIAGNOSIS — I10 Essential (primary) hypertension: Secondary | ICD-10-CM | POA: Diagnosis not present

## 2017-04-16 DIAGNOSIS — R634 Abnormal weight loss: Secondary | ICD-10-CM | POA: Diagnosis not present

## 2017-04-16 DIAGNOSIS — E034 Atrophy of thyroid (acquired): Secondary | ICD-10-CM | POA: Diagnosis not present

## 2017-04-16 DIAGNOSIS — R270 Ataxia, unspecified: Secondary | ICD-10-CM | POA: Diagnosis not present

## 2017-04-16 DIAGNOSIS — E538 Deficiency of other specified B group vitamins: Secondary | ICD-10-CM | POA: Diagnosis not present

## 2017-04-16 LAB — BASIC METABOLIC PANEL
BUN: 26 mg/dL — AB (ref 6–23)
CO2: 32 mEq/L (ref 19–32)
CREATININE: 1.17 mg/dL (ref 0.40–1.50)
Calcium: 9.3 mg/dL (ref 8.4–10.5)
Chloride: 106 mEq/L (ref 96–112)
GFR: 62.23 mL/min (ref >60.00)
GLUCOSE: 84 mg/dL (ref 70–99)
Potassium: 4.5 mEq/L (ref 3.5–5.1)
Sodium: 146 mEq/L — ABNORMAL HIGH (ref 135–145)

## 2017-04-16 NOTE — Progress Notes (Signed)
Subjective:  Patient ID: Barry Molina, male    DOB: [birth year]  Age: 84 y.o. MRN: [id number]  CC: No chief complaint on file.   HPI Barry Molina presents for ataxia, OA, neuropathy, wt loss f/u  Outpatient Medications Prior to Visit  Medication Sig Dispense Refill  . acetaminophen (TYLENOL) 325 MG tablet Take 650 mg by mouth 4 (four) times daily. Take ever day per daughter    . Cholecalciferol (VITAMIN D3) 1000 UNITS CAPS Take 1,000 Units by mouth daily.     . cyanocobalamin 1000 MCG tablet Take 1,000 mcg by mouth daily.    . fluticasone (FLONASE) 50 MCG/ACT nasal spray USE 1 SPRAY IN EACH NOSTRIL DAILY AS NEEDED (Patient taking differently: USE 1 SPRAY IN EACH NOSTRIL DAILY AS NEEDED FOR ALLERGIES) 16 g 1  . furosemide (LASIX) 20 MG tablet TAKE 1 TO 2 TABLETS BY MOUTH ONCE DAILY AS NEEDED FOR  EDEMA 180 tablet 0  . levothyroxine (SYNTHROID, LEVOTHROID) 125 MCG tablet TAKE ONE TABLET BY MOUTH ONCE DAILY 90 tablet 3  . metoprolol succinate (TOPROL-XL) 25 MG 24 hr tablet Take 0.5 tablets (12.5 mg total) by mouth daily. 45 tablet 3  . omeprazole (PRILOSEC) 40 MG capsule Take 1 capsule (40 mg total) by mouth daily. 90 capsule 3  . traMADol (ULTRAM) 50 MG tablet TAKE 1 TO 2 TABLETS BY MOUTH EVERY 12 HOURS AS NEEDED FOR  SEVERE  PAIN 120 tablet 0  . Vitamin D, Ergocalciferol, (DRISDOL) 50000 units CAPS capsule TAKE 1 CAPSULE BY MOUTH ONCE A WEEK 12 capsule 0   No facility-administered medications prior to visit.     ROS Review of Systems  Constitutional: Negative for appetite change, fatigue and unexpected weight change.  HENT: Negative for congestion, nosebleeds, sneezing, sore throat and trouble swallowing.   Eyes: Negative for itching and visual disturbance.  Respiratory: Negative for cough.   Cardiovascular: Negative for chest pain, palpitations and leg swelling.  Gastrointestinal: Negative for abdominal distention, blood in stool, diarrhea and nausea.  Genitourinary: Negative  for frequency and hematuria.  Musculoskeletal: Positive for arthralgias, back pain and gait problem. Negative for joint swelling and neck pain.  Skin: Negative for rash.  Neurological: Positive for weakness. Negative for dizziness, tremors and speech difficulty.  Psychiatric/Behavioral: Positive for decreased concentration. Negative for agitation, behavioral problems, dysphoric mood and sleep disturbance. The patient is not nervous/anxious.     Objective:  BP 126/78 (BP Location: Right Arm, Patient Position: Sitting, Cuff Size: Large)   Pulse 71   Temp 97.8 F (36.6 C) (Oral)   Ht 5\' 11"  (1.803 m)   Wt 193 lb (87.5 kg)   SpO2 96%   BMI 26.92 kg/m   BP Readings from Last 3 Encounters:  04/16/17 126/78  03/14/17 122/82  12/18/16 126/78    Wt Readings from Last 3 Encounters:  04/16/17 193 lb (87.5 kg)  03/14/17 195 lb (88.5 kg)  12/18/16 195 lb (88.5 kg)    Physical Exam  Constitutional: He is oriented to person, place, and time. He appears well-developed. No distress.  NAD  HENT:  Mouth/Throat: Oropharynx is clear and moist.  Eyes: Conjunctivae are normal. Pupils are equal, round, and reactive to light.  Neck: Normal range of motion. No JVD present. No thyromegaly present.  Cardiovascular: Normal rate, regular rhythm, normal heart sounds and intact distal pulses. Exam reveals no gallop and no friction rub.  No murmur heard. Pulmonary/Chest: Effort normal and breath sounds normal. No respiratory distress. He has  no wheezes. He has no rales. He exhibits no tenderness.  Abdominal: Soft. Bowel sounds are normal. He exhibits no distension and no mass. There is no tenderness. There is no rebound and no guarding.  Musculoskeletal: Normal range of motion. He exhibits no edema or tenderness.  Lymphadenopathy:    He has no cervical adenopathy.  Neurological: He is alert and oriented to person, place, and time. He has normal reflexes. No cranial nerve deficit. He exhibits normal muscle  tone. He displays a negative Romberg sign. Coordination and gait normal.  Skin: Skin is warm and dry. No rash noted.  Psychiatric: He has a normal mood and affect. His behavior is normal. Judgment and thought content normal.  walker Ataxic Alert, cooperative  Lab Results  Component Value Date   WBC 8.8 04/04/2016   HGB 13.9 04/04/2016   HCT 41.4 04/04/2016   PLT 198 04/04/2016   GLUCOSE 106 (H) 12/18/2016   CHOL 223 (H) 03/31/2015   TRIG 125.0 03/31/2015   HDL 40.00 03/31/2015   LDLDIRECT 189.0 01/28/2009   LDLCALC 158 (H) 03/31/2015   ALT 10 12/18/2016   AST 18 12/18/2016   NA 143 12/18/2016   K 4.1 12/18/2016   CL 106 12/18/2016   CREATININE 1.05 12/18/2016   BUN 23 12/18/2016   CO2 26 12/18/2016   TSH 3.59 12/18/2016   PSA 2.77 03/31/2015   INR 1.17 04/06/2015    Ct Chest Wo Contrast  Result Date: 04/03/2016 CLINICAL DATA:  Shortness of breath. Follow-up abnormal chest radiograph. EXAM: CT CHEST WITHOUT CONTRAST TECHNIQUE: Multidetector CT imaging of the chest was performed following the standard protocol without IV contrast. COMPARISON:  Chest radiograph April 02, 2016 at 1614 hours FINDINGS: Cardiovascular: The heart is mildly enlarged. Status post CABG. Severe coronary artery calcifications/ stents. No pericardial effusions. Ascending aorta is 4.8 cm in transaxial dimension. Moderate calcific atherosclerosis of the aortic arch. Mediastinum/Nodes: No mediastinal lymphadenopathy, limited by noncontrast CT. Normal appearance of the thoracic esophagus the not tailored for evaluation. Lungs/Pleura: Slight RIGHT apical fibronodular scarring. Minimal scarring lingula. LEFT lower lobe atelectasis and elevated LEFT hemidiaphragm. Trace RIGHT middle lobe atelectasis. No pleural effusion or focal consolidation. Tracheobronchial tree is patent and midline. No pneumothorax. No RIGHT middle lobe mass, chest radiograph findings reflect RIGHT hilar vessels. Upper Abdomen: Nonobstructing 4 mm  LEFT nephrolithiasis. Calcified granuloma LEFT lobe of the liver. Musculoskeletal: Subcentimeter coarse calcification LEFT thyroid. Severe degenerative change of LEFT shoulder, mild with multiple surgical staples. Severe degenerative change of the RIGHT shoulder, with suture anchor. High-riding RIGHT humeral head. Bridging ventral thoracic spine osteophytes. IMPRESSION: Atelectasis. No acute pulmonary process. No RIGHT middle lobe mass or consolidation, chest radiograph findings correspond to RIGHT hilar vessels. Mild cardiomegaly . **An incidental finding of potential clinical significance has been found. 4.7 cm ascending aortic aneurysm. Recommend semi-annual imaging followup by CTA or MRA and referral to cardiothoracic surgery if not already obtained. This recommendation follows 2010 ACCF/AHA/AATS/ACR/ASA/SCA/SCAI/SIR/STS/SVM Guidelines for the Diagnosis and Management of Patients With Thoracic Aortic Disease. Circulation. 2010; 121: [id number]** Electronically Signed   By: Elon Alas M.D.   On: 04/03/2016 00:03   Dg Chest Port 1 View  Result Date: 04/02/2016 CLINICAL DATA:  Encephalopathy acute. Pt has no chest complaints at this time. Hx CAD, HTN, nonsmoker. EXAM: PORTABLE CHEST 1 VIEW COMPARISON:  04/06/2015 FINDINGS: Status post median sternotomy and CABG. Heart is enlarged. There is elevation of left hemidiaphragm which is stable. Shallow lung inflation and slight patient rotation. There is retrocardiac  density on the right. Although this may represent hilar vessels accentuated by the shallow lung inflation infiltrate or mass for difficult to exclude. There is prominence of interstitial markings at the base likely indicating mild interstitial edema. IMPRESSION: 1. Shallow inflation and patient rotation. 2. Question of mass or infiltrate in the medial right lung base. Further evaluation is warranted. Consider PA and lateral chest x-ray if the patient is able. Alternatively, chest CT may be needed.  Electronically Signed   By: Nolon Nations M.D.   On: 04/02/2016 16:40   Dg Knee Complete 4 Views Right  Result Date: 04/02/2016 CLINICAL DATA:  Pain after fall. EXAM: RIGHT KNEE - COMPLETE 4+ VIEW COMPARISON:  None. FINDINGS: Patellar fracture. Large joint effusion. Linear interface between the effusion and overlying fat is favored to be due to something on the patient as the linearity extends inferior to the knee. No convincing evidence of tibial plateau fracture. No other fractures. IMPRESSION: 1. Patellar fracture. 2. Large joint effusion. The linear interface between the effusion and adjacent fat is thought to be artifactual as described above. No visualized tibial plateau fracture. If there is high concern for tibial plateau fracture, a repeat lateral view could be obtained without anything on the patient. Electronically Signed   By: Dorise Bullion III M.D   On: 04/02/2016 12:17    Assessment & Plan:   There are no diagnoses linked to this encounter. I am having Barry Molina maintain his Vitamin D3, fluticasone, cyanocobalamin, acetaminophen, levothyroxine, metoprolol succinate, omeprazole, furosemide, traMADol, and Vitamin D (Ergocalciferol).  No orders of the defined types were placed in this encounter.    Follow-up: No Follow-up on file.  Walker Kehr, MD

## 2017-04-16 NOTE — Assessment & Plan Note (Signed)
Wt Readings from Last 3 Encounters:  04/16/17 193 lb (87.5 kg)  03/14/17 195 lb (88.5 kg)  12/18/16 195 lb (88.5 kg)

## 2017-04-16 NOTE — Assessment & Plan Note (Signed)
Furosemide.

## 2017-04-16 NOTE — Patient Instructions (Signed)
Diabetic socks 

## 2017-04-16 NOTE — Assessment & Plan Note (Signed)
Walker

## 2017-04-16 NOTE — Assessment & Plan Note (Signed)
Levothroid 

## 2017-04-16 NOTE — Assessment & Plan Note (Signed)
On B12 

## 2017-04-17 ENCOUNTER — Telehealth: Payer: Self-pay | Admitting: Internal Medicine

## 2017-04-17 NOTE — Telephone Encounter (Signed)
Pt daughter informed of lab results and expressed understanding.Marland Kitchen

## 2017-04-17 NOTE — Telephone Encounter (Signed)
noted 

## 2017-05-12 ENCOUNTER — Other Ambulatory Visit: Payer: Self-pay | Admitting: Family Medicine

## 2017-05-12 ENCOUNTER — Other Ambulatory Visit: Payer: Self-pay | Admitting: Internal Medicine

## 2017-06-16 ENCOUNTER — Other Ambulatory Visit: Payer: Self-pay | Admitting: Family Medicine

## 2017-07-14 ENCOUNTER — Other Ambulatory Visit: Payer: Self-pay | Admitting: Family Medicine

## 2017-07-21 ENCOUNTER — Other Ambulatory Visit: Payer: Self-pay | Admitting: Internal Medicine

## 2017-08-18 ENCOUNTER — Other Ambulatory Visit: Payer: Self-pay | Admitting: Family Medicine

## 2017-08-25 ENCOUNTER — Other Ambulatory Visit: Payer: Self-pay | Admitting: Internal Medicine

## 2017-09-15 ENCOUNTER — Other Ambulatory Visit: Payer: Self-pay | Admitting: Family Medicine

## 2017-09-15 ENCOUNTER — Other Ambulatory Visit: Payer: Self-pay | Admitting: Internal Medicine

## 2017-09-17 ENCOUNTER — Other Ambulatory Visit: Payer: Self-pay

## 2017-09-17 ENCOUNTER — Inpatient Hospital Stay (HOSPITAL_COMMUNITY)
Admission: EM | Admit: 2017-09-17 | Discharge: 2017-09-20 | DRG: 641 | Disposition: A | Discharge status: Skilled Nursing Facility | Payer: Medicare Other | Attending: Internal Medicine | Admitting: Internal Medicine

## 2017-09-17 ENCOUNTER — Emergency Department (HOSPITAL_COMMUNITY): Payer: Medicare Other

## 2017-09-17 ENCOUNTER — Encounter (HOSPITAL_COMMUNITY): Payer: Self-pay | Admitting: Emergency Medicine

## 2017-09-17 DIAGNOSIS — Y92009 Unspecified place in unspecified non-institutional (private) residence as the place of occurrence of the external cause: Secondary | ICD-10-CM

## 2017-09-17 DIAGNOSIS — Z7982 Long term (current) use of aspirin: Secondary | ICD-10-CM | POA: Diagnosis not present

## 2017-09-17 DIAGNOSIS — E872 Acidosis: Secondary | ICD-10-CM | POA: Diagnosis not present

## 2017-09-17 DIAGNOSIS — Z79890 Hormone replacement therapy (postmenopausal): Secondary | ICD-10-CM

## 2017-09-17 DIAGNOSIS — E860 Dehydration: Principal | ICD-10-CM | POA: Diagnosis present

## 2017-09-17 DIAGNOSIS — R17 Unspecified jaundice: Secondary | ICD-10-CM | POA: Diagnosis present

## 2017-09-17 DIAGNOSIS — I248 Other forms of acute ischemic heart disease: Secondary | ICD-10-CM | POA: Diagnosis present

## 2017-09-17 DIAGNOSIS — W19XXXA Unspecified fall, initial encounter: Secondary | ICD-10-CM | POA: Diagnosis present

## 2017-09-17 DIAGNOSIS — Z951 Presence of aortocoronary bypass graft: Secondary | ICD-10-CM

## 2017-09-17 DIAGNOSIS — Z85820 Personal history of malignant melanoma of skin: Secondary | ICD-10-CM

## 2017-09-17 DIAGNOSIS — I712 Thoracic aortic aneurysm, without rupture: Secondary | ICD-10-CM | POA: Diagnosis present

## 2017-09-17 DIAGNOSIS — G9340 Encephalopathy, unspecified: Secondary | ICD-10-CM | POA: Diagnosis present

## 2017-09-17 DIAGNOSIS — E039 Hypothyroidism, unspecified: Secondary | ICD-10-CM | POA: Diagnosis present

## 2017-09-17 DIAGNOSIS — Z8249 Family history of ischemic heart disease and other diseases of the circulatory system: Secondary | ICD-10-CM

## 2017-09-17 DIAGNOSIS — R410 Disorientation, unspecified: Secondary | ICD-10-CM | POA: Diagnosis present

## 2017-09-17 DIAGNOSIS — E785 Hyperlipidemia, unspecified: Secondary | ICD-10-CM | POA: Diagnosis not present

## 2017-09-17 DIAGNOSIS — E871 Hypo-osmolality and hyponatremia: Secondary | ICD-10-CM | POA: Diagnosis present

## 2017-09-17 DIAGNOSIS — Z888 Allergy status to other drugs, medicaments and biological substances status: Secondary | ICD-10-CM

## 2017-09-17 DIAGNOSIS — I2510 Atherosclerotic heart disease of native coronary artery without angina pectoris: Secondary | ICD-10-CM | POA: Diagnosis not present

## 2017-09-17 DIAGNOSIS — G629 Polyneuropathy, unspecified: Secondary | ICD-10-CM | POA: Diagnosis not present

## 2017-09-17 DIAGNOSIS — E538 Deficiency of other specified B group vitamins: Secondary | ICD-10-CM | POA: Diagnosis not present

## 2017-09-17 DIAGNOSIS — C4360 Malignant melanoma of unspecified upper limb, including shoulder: Secondary | ICD-10-CM | POA: Diagnosis present

## 2017-09-17 DIAGNOSIS — R310 Gross hematuria: Secondary | ICD-10-CM | POA: Diagnosis present

## 2017-09-17 DIAGNOSIS — Z885 Allergy status to narcotic agent status: Secondary | ICD-10-CM

## 2017-09-17 DIAGNOSIS — E870 Hyperosmolality and hypernatremia: Secondary | ICD-10-CM

## 2017-09-17 DIAGNOSIS — K219 Gastro-esophageal reflux disease without esophagitis: Secondary | ICD-10-CM | POA: Diagnosis present

## 2017-09-17 DIAGNOSIS — I10 Essential (primary) hypertension: Secondary | ICD-10-CM | POA: Diagnosis not present

## 2017-09-17 DIAGNOSIS — G5793 Unspecified mononeuropathy of bilateral lower limbs: Secondary | ICD-10-CM | POA: Diagnosis present

## 2017-09-17 DIAGNOSIS — Z79899 Other long term (current) drug therapy: Secondary | ICD-10-CM | POA: Diagnosis not present

## 2017-09-17 DIAGNOSIS — R778 Other specified abnormalities of plasma proteins: Secondary | ICD-10-CM | POA: Diagnosis present

## 2017-09-17 DIAGNOSIS — R7989 Other specified abnormal findings of blood chemistry: Secondary | ICD-10-CM

## 2017-09-17 LAB — URINALYSIS, ROUTINE W REFLEX MICROSCOPIC
Bacteria, UA: NONE SEEN
Bilirubin Urine: NEGATIVE
GLUCOSE, UA: NEGATIVE mg/dL
Ketones, ur: 80 mg/dL — AB
Leukocytes, UA: NEGATIVE
Nitrite: NEGATIVE
PH: 7 (ref 5.0–8.0)
Protein, ur: 30 mg/dL — AB
RBC / HPF: >50 RBC/hpf — ABNORMAL HIGH (ref 0–5)
SPECIFIC GRAVITY, URINE: 1.021 (ref 1.005–1.030)

## 2017-09-17 LAB — TSH: TSH: 1.616 u[IU]/mL (ref 0.350–4.500)

## 2017-09-17 LAB — LACTIC ACID, PLASMA: Lactic Acid, Venous: 1.6 mmol/L (ref 0.5–1.9)

## 2017-09-17 LAB — I-STAT CG4 LACTIC ACID, ED: Lactic Acid, Venous: 2.76 mmol/L (ref 0.5–1.9)

## 2017-09-17 LAB — RAPID URINE DRUG SCREEN, HOSP PERFORMED
Amphetamines: NOT DETECTED
BENZODIAZEPINES: NOT DETECTED
Barbiturates: NOT DETECTED
COCAINE: NOT DETECTED
OPIATES: NOT DETECTED
Tetrahydrocannabinol: NOT DETECTED

## 2017-09-17 LAB — PROCALCITONIN: Procalcitonin: <0.1 ng/mL

## 2017-09-17 LAB — CK TOTAL AND CKMB (NOT AT ARMC)
CK TOTAL: 1159 U/L — AB (ref 49–397)
CK, MB: 11 ng/mL — ABNORMAL HIGH (ref 0.5–5.0)
Relative Index: 0.9 (ref 0.0–2.5)

## 2017-09-17 LAB — COMPREHENSIVE METABOLIC PANEL
ALBUMIN: 3.8 g/dL (ref 3.5–5.0)
ALK PHOS: 62 U/L (ref 38–126)
ALT: 19 U/L (ref 0–44)
ANION GAP: 11 (ref 5–15)
AST: 48 U/L — AB (ref 15–41)
BILIRUBIN TOTAL: 1.8 mg/dL — AB (ref 0.3–1.2)
BUN: 22 mg/dL (ref 8–23)
CALCIUM: 9.5 mg/dL (ref 8.9–10.3)
CO2: 25 mmol/L (ref 22–32)
Chloride: 111 mmol/L (ref 98–111)
Creatinine, Ser: 1.05 mg/dL (ref 0.61–1.24)
GFR calc Af Amer: >60 mL/min (ref >60)
GFR calc non Af Amer: >60 mL/min (ref >60)
GLUCOSE: 132 mg/dL — AB (ref 70–99)
POTASSIUM: 3.7 mmol/L (ref 3.5–5.1)
SODIUM: 147 mmol/L — AB (ref 135–145)
Total Protein: 6.9 g/dL (ref 6.5–8.1)

## 2017-09-17 LAB — I-STAT TROPONIN, ED: TROPONIN I, POC: 0.36 ng/mL — AB (ref 0.00–0.08)

## 2017-09-17 LAB — CBC
HCT: 45.9 % (ref 39.0–52.0)
Hemoglobin: 15.1 g/dL (ref 13.0–17.0)
MCH: 32.7 pg (ref 26.0–34.0)
MCHC: 32.9 g/dL (ref 30.0–36.0)
MCV: 99.4 fL (ref 78.0–100.0)
Platelets: 221 10*3/uL (ref 150–400)
RBC: 4.62 MIL/uL (ref 4.22–5.81)
RDW: 12.9 % (ref 11.5–15.5)
WBC: 9.6 10*3/uL (ref 4.0–10.5)

## 2017-09-17 LAB — TROPONIN I: Troponin I: 1.03 ng/mL (ref <0.03)

## 2017-09-17 LAB — T4, FREE: Free T4: 0.97 ng/dL (ref 0.82–1.77)

## 2017-09-17 LAB — SEDIMENTATION RATE: Sed Rate: 14 mm/hr (ref 0–16)

## 2017-09-17 LAB — VITAMIN B12: Vitamin B-12: 1015 pg/mL — ABNORMAL HIGH (ref 180–914)

## 2017-09-17 LAB — CBG MONITORING, ED: GLUCOSE-CAPILLARY: 131 mg/dL — AB (ref 70–99)

## 2017-09-17 LAB — AMMONIA: AMMONIA: 16 umol/L (ref 9–35)

## 2017-09-17 MED ORDER — FLUTICASONE PROPIONATE 50 MCG/ACT NA SUSP
1.0000 | Freq: Every day | NASAL | Status: DC | PRN
  Filled 2017-09-17: qty 16

## 2017-09-17 MED ORDER — SODIUM CHLORIDE 0.9 % IV SOLN: INTRAVENOUS | Status: DC

## 2017-09-17 MED ORDER — ONDANSETRON HCL 4 MG PO TABS: 4.0000 mg | ORAL_TABLET | Freq: Four times a day (QID) | ORAL | Status: DC | PRN

## 2017-09-17 MED ORDER — VITAMIN B-12 1000 MCG PO TABS
1000.0000 ug | ORAL_TABLET | Freq: Every day | ORAL | Status: DC
  Administered 2017-09-17 – 2017-09-20 (×4): 1000 ug via ORAL
  Filled 2017-09-17 (×4): qty 1

## 2017-09-17 MED ORDER — ACETAMINOPHEN 325 MG PO TABS
650.0000 mg | ORAL_TABLET | Freq: Once | ORAL | Status: AC
  Administered 2017-09-17: 650 mg via ORAL
  Filled 2017-09-17: qty 2

## 2017-09-17 MED ORDER — BISACODYL 10 MG RE SUPP: 10.0000 mg | Freq: Every day | RECTAL | Status: DC | PRN

## 2017-09-17 MED ORDER — PANTOPRAZOLE SODIUM 40 MG PO TBEC
40.0000 mg | DELAYED_RELEASE_TABLET | Freq: Every day | ORAL | Status: DC
  Administered 2017-09-17 – 2017-09-20 (×4): 40 mg via ORAL
  Filled 2017-09-17 (×4): qty 1

## 2017-09-17 MED ORDER — SODIUM CHLORIDE 0.9 % IV BOLUS
1000.0000 mL | Freq: Once | INTRAVENOUS | Status: AC
  Administered 2017-09-17: 1000 mL via INTRAVENOUS

## 2017-09-17 MED ORDER — METOPROLOL SUCCINATE 12.5 MG HALF TABLET
12.5000 mg | ORAL_TABLET | Freq: Every day | ORAL | Status: DC
  Administered 2017-09-17 – 2017-09-20 (×4): 12.5 mg via ORAL
  Filled 2017-09-17 (×4): qty 1

## 2017-09-17 MED ORDER — ASPIRIN EC 325 MG PO TBEC
325.0000 mg | DELAYED_RELEASE_TABLET | Freq: Every day | ORAL | Status: DC
Start: 2017-09-17 — End: 2017-09-20
  Administered 2017-09-17 – 2017-09-20 (×4): 325 mg via ORAL
  Filled 2017-09-17 (×4): qty 1

## 2017-09-17 MED ORDER — LEVOTHYROXINE SODIUM 125 MCG PO TABS
125.0000 ug | ORAL_TABLET | Freq: Every day | ORAL | Status: DC
  Administered 2017-09-17 – 2017-09-20 (×4): 125 ug via ORAL
  Filled 2017-09-17 (×4): qty 1

## 2017-09-17 MED ORDER — HYDROCODONE-ACETAMINOPHEN 5-325 MG PO TABS
1.0000 | ORAL_TABLET | ORAL | Status: DC | PRN
Start: 2017-09-17 — End: 2017-09-20
  Administered 2017-09-18: 1 via ORAL
  Administered 2017-09-19: 2 via ORAL
  Filled 2017-09-17: qty 2
  Filled 2017-09-17: qty 1

## 2017-09-17 MED ORDER — SODIUM CHLORIDE 0.45 % IV SOLN
INTRAVENOUS | Status: DC
  Administered 2017-09-17: 75 mL via INTRAVENOUS
  Administered 2017-09-18: 18:00:00 via INTRAVENOUS

## 2017-09-17 MED ORDER — ONDANSETRON HCL 4 MG/2ML IJ SOLN: 4.0000 mg | Freq: Four times a day (QID) | INTRAMUSCULAR | Status: DC | PRN

## 2017-09-17 MED ORDER — SENNOSIDES-DOCUSATE SODIUM 8.6-50 MG PO TABS: 1.0000 | ORAL_TABLET | Freq: Every evening | ORAL | Status: DC | PRN

## 2017-09-17 MED ORDER — HEPARIN SODIUM (PORCINE) 5000 UNIT/ML IJ SOLN: 5000.0000 [IU] | Freq: Three times a day (TID) | INTRAMUSCULAR | Status: DC

## 2017-09-17 NOTE — ED Notes (Signed)
Condom cath in place.

## 2017-09-17 NOTE — ED Notes (Signed)
Lab results reported to Nurse Gerald Stabs.

## 2017-09-17 NOTE — ED Provider Notes (Addendum)
[facility] EMERGENCY DEPARTMENT Provider Note   CSN: [id number] Arrival date & time: 09/17/17  1024     History   Chief Complaint Chief Complaint  Patient presents with  . Altered Mental Status    HPI Barry Molina is a 84 y.o. male.  Patient is a 84 year old male with a history of hypertension, hyperlipidemia, coronary artery disease status post bypass who is presenting today with change in behavior.  Patient does not give much history however his family is present and is able to supply a better history.  They state that for the last 2 days he has not been quite himself.  The patient states he just was not feeling well but family states he has been slightly confused and chilled constantly despite his house being 83 degrees.  He has not had cough, shortness of breath or chest pain.  Last night he does not recall what happened but family found him on the floor this morning.  They stated yesterday he was a little bit confused for example he could not remember where he been that day or what he had been doing.  He denies any recent medication changes.  He has some neck pain and mild headache but is not sure if he hit his head last night or how he ended up on the floor.  He denies any urinary complaints at this time.  No nausea, vomiting or diarrhea.  He denies abdominal pain.  The history is provided by the patient and a relative.  Altered Mental Status      Past Medical History:  Diagnosis Date  . B12 DEFICIENCY 12/09/2007  . Cataract   . COLONIC POLYPS, HX OF 12/14/2006  . CORONARY ARTERY BYPASS GRAFT, HX OF 08/26/2008  . CORONARY ARTERY DISEASE 12/14/2006  . Edema 08/27/2008  . ELBOW PAIN 10/06/2009  . GERD 12/14/2006  . HYPERLIPIDEMIA 12/14/2006  . HYPERTENSION 12/14/2006  . HYPOTHYROIDISM 12/14/2006  . LEG PAIN 05/10/2010  . Malignant melanoma of skin of upper limb, including shoulder ([place]) 09/07/2008   states he just had places removed;  not cancers  . MUSCLE  PAIN 04/10/2007  . NEOPLASM, SKIN, UNCERTAIN BEHAVIOR 4/78/2956  . Other specified acquired hypothyroidism 04/08/2007  . Personal history of other diseases of digestive disease 08/26/2008  . SHOULDER PAIN 12/19/2006    Patient Active Problem List   Diagnosis Date Noted  . Patella fracture 04/02/2016  . Elevated troponin 04/02/2016  . Arthritis of right knee 01/05/2016  . Acute confusional state 07/12/2015  . Headache 07/12/2015  . Post-concussion syndrome 07/12/2015  . Fracture of cervical vertebra, C5 ([place]) 07/12/2015  . Constipation 06/01/2015  . Acute encephalopathy 04/06/2015  . Well adult exam 03/31/2015  . Left rotator cuff tear arthropathy 04/23/2014  . Trigger index finger of right hand 01/07/2014  . Contusion of face 12/06/2012  . Sinusitis 12/06/2012  . Right knee pain 11/13/2012  . Ataxia 08/08/2012  . Neuropathy of both feet 08/08/2012  . Fall at home 10/23/2011  . Weight loss, abnormal 11/16/2010  . LEG PAIN 05/10/2010  . ELBOW PAIN 10/06/2009  . Malignant melanoma of skin of upper limb, including shoulder ([place]) 09/07/2008  . Edema 08/27/2008  . Personal history of other diseases of digestive system 08/26/2008  . CORONARY ARTERY BYPASS GRAFT, HX OF 08/26/2008  . NEOPLASM, SKIN, UNCERTAIN BEHAVIOR 21/30/8657  . B12 deficiency 12/09/2007  . MUSCLE PAIN 04/10/2007  . SHOULDER PAIN 12/19/2006  . Hypothyroidism 12/14/2006  . Dyslipidemia 12/14/2006  .  Essential hypertension 12/14/2006  . Coronary atherosclerosis 12/14/2006  . GERD 12/14/2006  . COLONIC POLYPS, HX OF 12/14/2006    Past Surgical History:  Procedure Laterality Date  . BACK SURGERY  09/27/10  . CHOLECYSTECTOMY  9/12  . CORONARY ARTERY BYPASS GRAFT    . right ankle surgery    . ROTATOR CUFF REPAIR     bilateral  . SPINE SURGERY  8/12   LS spine cyst        Home Medications    Prior to Admission medications   Medication Sig Start Date End Date Taking? Authorizing Provider  acetaminophen  (TYLENOL) 325 MG tablet Take 650 mg by mouth 4 (four) times daily. Take ever day per daughter   Yes [provider]  aspirin 325 MG EC tablet Take 325 mg by mouth daily.   Yes [provider]  cyanocobalamin 1000 MCG tablet Take 1,000 mcg by mouth daily.   Yes [provider]  fluticasone (FLONASE) 50 MCG/ACT nasal spray USE 1 SPRAY IN EACH NOSTRIL DAILY AS NEEDED Patient taking differently: USE 1 SPRAY IN EACH NOSTRIL DAILY AS NEEDED FOR ALLERGIES 02/14/12  Yes Plotnikov, Evie Lacks, MD  furosemide (LASIX) 20 MG tablet TAKE 1 TO 2 TABLETS BY MOUTH ONCE DAILY AS NEEDED FOR EDEMA Patient taking differently: TAKE 20MG  BY MOUTH ONCE DAILY 07/24/17  Yes Plotnikov, Evie Lacks, MD  levothyroxine (SYNTHROID, LEVOTHROID) 125 MCG tablet TAKE 1 TABLET BY MOUTH ONCE DAILY 05/14/17  Yes Plotnikov, Evie Lacks, MD  metoprolol succinate (TOPROL-XL) 25 MG 24 hr tablet TAKE 1/2 (ONE-HALF) TABLET BY MOUTH ONCE DAILY 09/16/17  Yes Plotnikov, Evie Lacks, MD  omeprazole (PRILOSEC) 40 MG capsule TAKE 1 CAPSULE BY MOUTH ONCE DAILY 08/27/17  Yes Plotnikov, Evie Lacks, MD  traMADol (ULTRAM) 50 MG tablet TAKE 1 TO 2 TABLETS BY MOUTH EVERY 12 HOURS AS NEEDED FOR SEVERE PAIN 08/20/17  Yes Lyndal Pulley, DO  Vitamin D, Ergocalciferol, (DRISDOL) 50000 units CAPS capsule TAKE 1 CAPSULE BY MOUTH ONCE A WEEK 06/18/17  Yes Lyndal Pulley, DO    Family History Family History  Problem Relation Age of Onset  . Hypertension Father   . Cancer Father   . Stroke Mother 40  . Heart disease Other     Social History Social History   Tobacco Use  . Smoking status: Never Smoker  . Smokeless tobacco: Never Used  Substance Use Topics  . Alcohol use: No    Alcohol/week: 0.0 oz  . Drug use: No     Allergies   Ezetimibe-simvastatin and Morphine   Review of Systems Review of Systems  All other systems reviewed and are negative.    Physical Exam Updated Vital Signs BP 121/71   Pulse 95   Temp 99.3  F (37.4 C) (Oral)   Resp 12   Ht 6' (1.829 m)   Wt 86.2 kg (190 lb)   SpO2 (!) 87%   BMI 25.77 kg/m   Physical Exam  Constitutional: He is oriented to person, place, and time. He appears well-developed and well-nourished. No distress.  HENT:  Head: Normocephalic and atraumatic.  Mouth/Throat: Oropharynx is clear and moist. Mucous membranes are dry.  Eyes: Pupils are equal, round, and reactive to light. Conjunctivae and EOM are normal.  Neck: Normal range of motion. Neck supple. Muscular tenderness present. No spinous process tenderness present. Normal range of motion present. No Brudzinski's sign and no Kernig's sign noted.  Cardiovascular: Normal rate, regular rhythm and intact distal pulses.  No murmur heard. Pulmonary/Chest: Effort normal and breath sounds normal. No respiratory distress. He has no wheezes. He has no rales.  Abdominal: Soft. He exhibits no distension. There is no tenderness. There is no rebound and no guarding.  Musculoskeletal: Normal range of motion. He exhibits no edema or tenderness.  Small abrasion over the right shin.  Neurological: He is alert and oriented to person, place, and time.  Patient falls asleep on exam but is easily arousable.  He has 5 out of 5 strength in all 4 extremities.  Sensation is intact.  Skin: Skin is warm and dry. No rash noted. No erythema.  Psychiatric: He has a normal mood and affect. His behavior is normal.  Nursing note and vitals reviewed.    ED Treatments / Results  Labs (all labs ordered are listed, but only abnormal results are displayed) Labs Reviewed  COMPREHENSIVE METABOLIC PANEL - Abnormal; Notable for the following components:      Result Value   Sodium 147 (*)    Glucose, Bld 132 (*)    AST 48 (*)    Total Bilirubin 1.8 (*)    All other components within normal limits  URINALYSIS, ROUTINE W REFLEX MICROSCOPIC - Abnormal; Notable for the following components:   Hgb urine dipstick SMALL (*)    Ketones, ur 80  (*)    Protein, ur 30 (*)    RBC / HPF >50 (*)    All other components within normal limits  CBG MONITORING, ED - Abnormal; Notable for the following components:   Glucose-Capillary 131 (*)    All other components within normal limits  I-STAT TROPONIN, ED - Abnormal; Notable for the following components:   Troponin i, poc 0.36 (*)    All other components within normal limits  I-STAT CG4 LACTIC ACID, ED - Abnormal; Notable for the following components:   Lactic Acid, Venous 2.76 (*)    All other components within normal limits  CULTURE, BLOOD (ROUTINE X 2)  CULTURE, BLOOD (ROUTINE X 2)  CBC    EKG EKG Interpretation  Date/Time:  Monday September 17 2017 10:36:11 EDT Ventricular Rate:  95 PR Interval:    QRS Duration: 155 QT Interval:  410 QTC Calculation: 516 R Axis:   41 Text Interpretation:  Sinus rhythm Right bundle branch block Probable inferior infarct, age indeterminate Lateral leads are also involved Baseline wander in lead(s) V3 No significant change since last tracing Confirmed by Blanchie Dessert [phone]) on 09/17/2017 10:41:23 AM   Radiology Dg Chest 2 View  Result Date: 09/17/2017 CLINICAL DATA:  Syncope EXAM: CHEST - 2 VIEW COMPARISON:  04/02/2016 and prior exams FINDINGS: Cardiomegaly and CABG changes again noted. Elevation of the LEFT hemidiaphragm is unchanged. There is no evidence of focal airspace disease, pulmonary edema, suspicious pulmonary nodule/mass, pleural effusion, or pneumothorax. No acute bony abnormalities are identified. IMPRESSION: Cardiomegaly without evidence of acute cardiopulmonary disease. Electronically Signed   By: Margarette Canada M.D.   On: 09/17/2017 12:12   Ct Head Wo Contrast  Result Date: 09/17/2017 CLINICAL DATA:  84 year old male found on floor. Patient does not remember what happened. Denies pain. Initial encounter. EXAM: CT HEAD WITHOUT CONTRAST CT CERVICAL SPINE WITHOUT CONTRAST TECHNIQUE: Multidetector CT imaging of the head and cervical  spine was performed following the standard protocol without intravenous contrast. Multiplanar CT image reconstructions of the cervical spine were also generated. COMPARISON:  07/12/2015 CT. FINDINGS: CT HEAD FINDINGS Brain: No intracranial hemorrhage or CT evidence of large acute infarct. Chronic  microvascular changes. Global atrophy. No intracranial mass lesion noted on this unenhanced exam. Vascular: Vascular calcifications Skull: No skull fracture Sinuses/Orbits: No acute orbital abnormality. Moderate mucosal thickening right maxillary sinus. Minimal mucosal thickening ethmoid sinus air cells. Partial opacification posterior right sphenoid sinus. Other: Mastoid air cells and middle ear cavities are clear. CT CERVICAL SPINE FINDINGS Alignment: Minimal anterior slip C4 and C7 similar to prior exam. Skull base and vertebrae: No acute cervical spine fracture. Soft tissues and spinal canal: No abnormal prevertebral soft tissue swelling. Disc levels: Multilevel cervical spondylotic changes most prominent C5-6 and C6-7. Transverse ligament hypertrophy with partial calcification. Upper chest: Scarring lung apices greater on right. Other: Aortic calcification and carotid artery calcification. IMPRESSION: No skull fracture or intracranial hemorrhage. Chronic microvascular changes without CT evidence of large acute infarct. Atrophy. No acute cervical spine fracture. Alignment similar to prior exam. No abnormal prevertebral soft tissue swelling. Multifactorial cervical spondylotic changes most notable C5-6 and C6-7. Paranasal sinus mucosal thickening/opacification most notable involving the right maxillary sinus. Aortic Atherosclerosis (ICD10-I70.0). Electronically Signed   By: Genia Del M.D.   On: 09/17/2017 12:48   Ct Cervical Spine Wo Contrast  Result Date: 09/17/2017 CLINICAL DATA:  84 year old male found on floor. Patient does not remember what happened. Denies pain. Initial encounter. EXAM: CT HEAD WITHOUT  CONTRAST CT CERVICAL SPINE WITHOUT CONTRAST TECHNIQUE: Multidetector CT imaging of the head and cervical spine was performed following the standard protocol without intravenous contrast. Multiplanar CT image reconstructions of the cervical spine were also generated. COMPARISON:  07/12/2015 CT. FINDINGS: CT HEAD FINDINGS Brain: No intracranial hemorrhage or CT evidence of large acute infarct. Chronic microvascular changes. Global atrophy. No intracranial mass lesion noted on this unenhanced exam. Vascular: Vascular calcifications Skull: No skull fracture Sinuses/Orbits: No acute orbital abnormality. Moderate mucosal thickening right maxillary sinus. Minimal mucosal thickening ethmoid sinus air cells. Partial opacification posterior right sphenoid sinus. Other: Mastoid air cells and middle ear cavities are clear. CT CERVICAL SPINE FINDINGS Alignment: Minimal anterior slip C4 and C7 similar to prior exam. Skull base and vertebrae: No acute cervical spine fracture. Soft tissues and spinal canal: No abnormal prevertebral soft tissue swelling. Disc levels: Multilevel cervical spondylotic changes most prominent C5-6 and C6-7. Transverse ligament hypertrophy with partial calcification. Upper chest: Scarring lung apices greater on right. Other: Aortic calcification and carotid artery calcification. IMPRESSION: No skull fracture or intracranial hemorrhage. Chronic microvascular changes without CT evidence of large acute infarct. Atrophy. No acute cervical spine fracture. Alignment similar to prior exam. No abnormal prevertebral soft tissue swelling. Multifactorial cervical spondylotic changes most notable C5-6 and C6-7. Paranasal sinus mucosal thickening/opacification most notable involving the right maxillary sinus. Aortic Atherosclerosis (ICD10-I70.0). Electronically Signed   By: Genia Del M.D.   On: 09/17/2017 12:48    Procedures Procedures (including critical care time)  Medications Ordered in ED Medications -  No data to display   Initial Impression / Assessment and Plan / ED Course  I have reviewed the triage vital signs and the nursing notes.  Pertinent labs & imaging results that were available during my care of the patient were reviewed by me and considered in my medical decision making (see chart for details).     Elderly male presents today with concern for possible infectious etiology, sepsis, syncope or cardiac cause.  Patient has been mildly confused over the last few days and has felt very cold despite being in a warm setting.  Patient states he just does not feel well but does  not give any further helpful history.  Family states he has been slightly confused.  They found him on the floor this morning but he cannot remember how he got there and he does live alone.  On exam here patient appears intact.  He is sleepy and will go to sleep but is easily arousable.  He does not have any focal deficits concerning for stroke at this time.  He does not have obvious sign of head injury.  No evidence of cellulitis.  Neurovascularly intact.  He denies any chest pain, shortness of breath or palpitations.  Patient's EKG is unchanged from prior.  Blood sugar is within normal limits. Will ensure that patient does not have infection.  When he falls asleep his oxygen does drop to 87% but when he is awake it goes back up.  Heart and lungs are clear.  Concern for possible urinary tract infection or other infectious etiology.  However also will rule out cardiac causes.  Patient's troponin is mildly elevated today at 0.36.  CMP, CBC, lactic acid, chest x-ray, head and neck CT pending.  1:51 PM Imaging without acute findings.  CBC within normal limits, lactate still pending and CMP with mild hyponatremia of 147.  Patient's rectal temperature is 100. Lactate elevated at 2.76  2:32 PM Blood cultures were drawn.  Patient's urine without signs of infection.  However given the low-grade temperature, hypernatremia,  confusion and normal imaging still concern for possible underlying infection.  Will discuss with hospitalist for admission.  We will continue hydration.  Possible endocarditis given low-grade temperature and new elevated troponin.  May need an echo to further evaluate.  Will need to trend troponins.  CRITICAL CARE Performed by: Timesha Cervantez Total critical care time: 30 minutes Critical care time was exclusive of separately billable procedures and treating other patients. Critical care was necessary to treat or prevent imminent or life-threatening deterioration. Critical care was time spent personally by me on the following activities: development of treatment plan with patient and/or surrogate as well as nursing, discussions with consultants, evaluation of patient's response to treatment, examination of patient, obtaining history from patient or surrogate, ordering and performing treatments and interventions, ordering and review of laboratory studies, ordering and review of radiographic studies, pulse oximetry and re-evaluation of patient's condition.   Final Clinical Impressions(s) / ED Diagnoses   Final diagnoses:  Dehydration  Acute hypernatremia  Confusion  Elevated troponin    ED Discharge Orders    None       Blanchie Dessert, MD 09/17/17 1437    Blanchie Dessert, MD 09/25/17 1526

## 2017-09-17 NOTE — ED Triage Notes (Signed)
Per EMS this pt lives at home along but is checked on multiple times a day by family.  He is ambulatory with a walker and is independent at night.  This morning his family called him and he didn't answer his phone so they went his house and found him on the floor where he had soiled his pants.  Pt states he does not remember what had happened.  Pt is alert and following commands but is disoriented to time. Denies pain.

## 2017-09-17 NOTE — ED Notes (Signed)
Lab results was reported to Dr. Maryan Rued.

## 2017-09-17 NOTE — Progress Notes (Signed)
RN called elevated troponin to NP. NP reviewed chart. Hx CABG in 2010 and looking back over one year of computer records, NP sees no f/up with a cardiologist here. He takes 325mg  ASA and a BB daily. Looks like he has an intolerance to statins under allergies. He is having NO active chest pain. He was found down in his home prior to admission and his CK is also elevated. NP believes troponin elevation is demand ischemia due to being found down. However, NP has paged cardio on call to confer and am awaiting a response. In meantime, continue to watch for CP, r/p EKG, and f/up troponins.  KJKG, NP [facility]

## 2017-09-17 NOTE — H&P (Signed)
History and Physical    Barry Molina ZCH:[id number] DOB: 1925/10/05 DOA: 09/17/2017   PCP: Cassandria Anger, MD   Patient coming from:  Home    Chief Complaint: Fall with "change in behavior"   HPI: Barry Molina is a 84 y.o. male with medical history significant for hypertension, hyperlipidemia, CAD status post CABG, brought to the hospital today after family noticing a change in behavior.  Information is obtained by family, as the patient is level 5 caveat due to confusion.  Daughter reports that over the last 2 days, the patient has not been "quite himself ".  He was slightly confused, unable to tell date or place, but able to be alert to person.  He also complained of chills despite the house being at 83 degrees.  He denies any shortness of breath, cough, hemoptysis, chest pain or palpitations.  He went to sleep (the patient lives by himself with close family supervision and caretaker that comes in goes and of parenthesis, and this morning, he was found by a family member on the floor.  He is unsure of any head trauma.  He denies any abdominal pain, nausea or vomiting.  He denies any dysuria, gross hematuria.  No evidence of cellulitis.  No apparent lower extremity swelling, he has a small scab in the right lower extremity, which the family has clean and placed a gauze, but is nontender, and no infection is noted in the area.  Patient is compliant with his medications, and he takes and as instructed by his sitter, there is no clear evidence of overdose.  No alcohol or recreational drug use.   ED Course:  BP (!) 165/75   Pulse 69   Temp 97.8 F (36.6 C)   Resp 20   Ht 6' (1.829 m)   Wt 86.2 kg (190 lb)   SpO2 97%   BMI 25.77 kg/m   EKG sinus rhythm, unchanged from prior.  Troponin 0 0.36.  Lactic acid was 2.76, UA negative for nitrates CT of the head and cervical spine negative for acute findings. Chest x-ray NAD Blood cultures are pending Sodium 147, potassium 3.7, glucose  132, creatinine 1.05, AST 48, white count 9.6, hemoglobin 15.1, platelets 221.  Review of Systems:  As per HPI otherwise all other systems reviewed and are negative  Past Medical History:  Diagnosis Date  . B12 DEFICIENCY 12/09/2007  . Cataract   . COLONIC POLYPS, HX OF 12/14/2006  . CORONARY ARTERY BYPASS GRAFT, HX OF 08/26/2008  . CORONARY ARTERY DISEASE 12/14/2006  . Edema 08/27/2008  . ELBOW PAIN 10/06/2009  . GERD 12/14/2006  . HYPERLIPIDEMIA 12/14/2006  . HYPERTENSION 12/14/2006  . HYPOTHYROIDISM 12/14/2006  . LEG PAIN 05/10/2010  . Malignant melanoma of skin of upper limb, including shoulder ([place]) 09/07/2008   states he just had places removed;  not cancers  . MUSCLE PAIN 04/10/2007  . NEOPLASM, SKIN, UNCERTAIN BEHAVIOR 2/87/8676  . Other specified acquired hypothyroidism 04/08/2007  . Personal history of other diseases of digestive disease 08/26/2008  . SHOULDER PAIN 12/19/2006    Past Surgical History:  Procedure Laterality Date  . BACK SURGERY  09/27/10  . CHOLECYSTECTOMY  9/12  . CORONARY ARTERY BYPASS GRAFT    . right ankle surgery    . ROTATOR CUFF REPAIR     bilateral  . SPINE SURGERY  8/12   LS spine cyst    Social History Social History   Socioeconomic History  . Marital status: Widowed  Spouse name: Not on file  . Number of children: Not on file  . Years of education: Not on file  . Highest education level: Not on file  Occupational History  . Not on file  Social Needs  . Financial resource strain: Not on file  . Food insecurity:    Worry: Not on file    Inability: Not on file  . Transportation needs:    Medical: Not on file    Non-medical: Not on file  Tobacco Use  . Smoking status: Never Smoker  . Smokeless tobacco: Never Used  Substance and Sexual Activity  . Alcohol use: No    Alcohol/week: 0.0 oz  . Drug use: No  . Sexual activity: Not Currently  Lifestyle  . Physical activity:    Days per week: Not on file    Minutes per session: Not  on file  . Stress: Not on file  Relationships  . Social connections:    Talks on phone: Not on file    Gets together: Not on file    Attends religious service: Not on file    Active member of club or organization: Not on file    Attends meetings of clubs or organizations: Not on file    Relationship status: Not on file  . Intimate partner violence:    Fear of current or ex partner: Not on file    Emotionally abused: Not on file    Physically abused: Not on file    Forced sexual activity: Not on file  Other Topics Concern  . Not on file  Social History Narrative   Lost wife in Dec;   Living alone;    Children are close by; 2 dtrs live within walking distance   One level home      Stay there as long as he can     Allergies  Allergen Reactions  . Ezetimibe-Simvastatin Other (See Comments)     aches  . Morphine Nausea And Vomiting    Family History  Problem Relation Age of Onset  . Hypertension Father   . Cancer Father   . Stroke Mother 40  . Heart disease Other        Prior to Admission medications   Medication Sig Start Date End Date Taking? Authorizing Provider  acetaminophen (TYLENOL) 325 MG tablet Take 650 mg by mouth 4 (four) times daily. Take ever day per daughter   Yes [provider]  aspirin 325 MG EC tablet Take 325 mg by mouth daily.   Yes [provider]  cyanocobalamin 1000 MCG tablet Take 1,000 mcg by mouth daily.   Yes [provider]  fluticasone (FLONASE) 50 MCG/ACT nasal spray USE 1 SPRAY IN EACH NOSTRIL DAILY AS NEEDED Patient taking differently: USE 1 SPRAY IN EACH NOSTRIL DAILY AS NEEDED FOR ALLERGIES 02/14/12  Yes Plotnikov, Evie Lacks, MD  furosemide (LASIX) 20 MG tablet TAKE 1 TO 2 TABLETS BY MOUTH ONCE DAILY AS NEEDED FOR EDEMA Patient taking differently: TAKE 20MG  BY MOUTH ONCE DAILY 07/24/17  Yes Plotnikov, Evie Lacks, MD  levothyroxine (SYNTHROID, LEVOTHROID) 125 MCG tablet TAKE 1 TABLET BY MOUTH ONCE DAILY 05/14/17   Yes Plotnikov, Evie Lacks, MD  metoprolol succinate (TOPROL-XL) 25 MG 24 hr tablet TAKE 1/2 (ONE-HALF) TABLET BY MOUTH ONCE DAILY 09/16/17  Yes Plotnikov, Evie Lacks, MD  omeprazole (PRILOSEC) 40 MG capsule TAKE 1 CAPSULE BY MOUTH ONCE DAILY 08/27/17  Yes Plotnikov, Evie Lacks, MD  traMADol (ULTRAM) 50 MG tablet TAKE  1 TO 2 TABLETS BY MOUTH EVERY 12 HOURS AS NEEDED FOR SEVERE PAIN 08/20/17  Yes Lyndal Pulley, DO  Vitamin D, Ergocalciferol, (DRISDOL) 50000 units CAPS capsule TAKE 1 CAPSULE BY MOUTH ONCE A WEEK 06/18/17  Yes Lyndal Pulley, DO     Physical Exam:  Vitals:   09/17/17 1235 09/17/17 1245 09/17/17 1250 09/17/17 1330  BP:  127/79  (!) 165/75  Pulse:  90  69  Resp:  18  20  Temp: (S) 100 F (37.8 C)  97.8 F (36.6 C)   TempSrc: Rectal     SpO2:  94%  97%  Weight:      Height:       Constitutional: NAD, calm, sound asleep at this time.   Eyes: PERRL, lids and conjunctivae normal ENMT: Mucous membranes are dry without exudate or lesions  Neck: normal, supple, no masses, no thyromegaly Respiratory: clear to auscultation bilaterally, no wheezing, no crackles. Normal respiratory effort  Cardiovascular: Regular rate and rhythm, 1 out of 6 murmur, rubs or gallops. No extremity edema. 2+ pedal pulses. No carotid bruits.  Abdomen: Soft, non tender, No hepatosplenomegaly. Bowel sounds positive.  Musculoskeletal: no clubbing / cyanosis. Moves all extremities Skin: no jaundice, small scab in the right lower extremity at the pretibial area, which is not showing any erythema, and no purulence noted Neurologic: Sensation appears intact  Strength unable to test, the patient cannot interact.    Psychiatric:   At this time, the patient is sound asleep, essentially nonresponsive to touch or to sound.    Labs on Admission: I have personally reviewed following labs and imaging studies  CBC: Recent Labs  Lab 09/17/17 1059  WBC 9.6  HGB 15.1  HCT 45.9  MCV 99.4  PLT 221    Basic  Metabolic Panel: Recent Labs  Lab 09/17/17 1059  NA 147*  K 3.7  CL 111  CO2 25  GLUCOSE 132*  BUN 22  CREATININE 1.05  CALCIUM 9.5    GFR: Estimated Creatinine Clearance: 51.3 mL/min (by C-G formula based on SCr of 1.05 mg/dL).  Liver Function Tests: Recent Labs  Lab 09/17/17 1059  AST 48*  ALT 19  ALKPHOS 62  BILITOT 1.8*  PROT 6.9  ALBUMIN 3.8   No results for input(s): LIPASE, AMYLASE in the last 168 hours. No results for input(s): AMMONIA in the last 168 hours.  Coagulation Profile: No results for input(s): INR, PROTIME in the last 168 hours.  Cardiac Enzymes: No results for input(s): CKTOTAL, CKMB, CKMBINDEX, TROPONINI in the last 168 hours.  BNP (last 3 results) No results for input(s): PROBNP in the last 8760 hours.  HbA1C: No results for input(s): HGBA1C in the last 72 hours.  CBG: Recent Labs  Lab 09/17/17 1133  GLUCAP 131*    Lipid Profile: No results for input(s): CHOL, HDL, LDLCALC, TRIG, CHOLHDL, LDLDIRECT in the last 72 hours.  Thyroid Function Tests: No results for input(s): TSH, T4TOTAL, FREET4, T3FREE, THYROIDAB in the last 72 hours.  Anemia Panel: No results for input(s): VITAMINB12, FOLATE, FERRITIN, TIBC, IRON, RETICCTPCT in the last 72 hours.  Urine analysis:    Component Value Date/Time   COLORURINE YELLOW 09/17/2017 1340   APPEARANCEUR CLEAR 09/17/2017 1340   LABSPEC 1.021 09/17/2017 1340   PHURINE 7.0 09/17/2017 1340   GLUCOSEU NEGATIVE 09/17/2017 1340   GLUCOSEU NEGATIVE 07/12/2015 1200   HGBUR SMALL (A) 09/17/2017 1340   BILIRUBINUR NEGATIVE 09/17/2017 1340   KETONESUR 80 (A) 09/17/2017 1340  PROTEINUR 30 (A) 09/17/2017 1340   UROBILINOGEN 1.0 07/12/2015 1200   NITRITE NEGATIVE 09/17/2017 1340   LEUKOCYTESUR NEGATIVE 09/17/2017 1340    Sepsis Labs: @LABRCNTIP (procalcitonin:4,lacticidven:4) )No results found for this or any previous visit (from the past 240 hour(s)).   Radiological Exams on Admission: Dg  Chest 2 View  Result Date: 09/17/2017 CLINICAL DATA:  Syncope EXAM: CHEST - 2 VIEW COMPARISON:  04/02/2016 and prior exams FINDINGS: Cardiomegaly and CABG changes again noted. Elevation of the LEFT hemidiaphragm is unchanged. There is no evidence of focal airspace disease, pulmonary edema, suspicious pulmonary nodule/mass, pleural effusion, or pneumothorax. No acute bony abnormalities are identified. IMPRESSION: Cardiomegaly without evidence of acute cardiopulmonary disease. Electronically Signed   By: Margarette Canada M.D.   On: 09/17/2017 12:12   Ct Head Wo Contrast  Result Date: 09/17/2017 CLINICAL DATA:  84 year old male found on floor. Patient does not remember what happened. Denies pain. Initial encounter. EXAM: CT HEAD WITHOUT CONTRAST CT CERVICAL SPINE WITHOUT CONTRAST TECHNIQUE: Multidetector CT imaging of the head and cervical spine was performed following the standard protocol without intravenous contrast. Multiplanar CT image reconstructions of the cervical spine were also generated. COMPARISON:  07/12/2015 CT. FINDINGS: CT HEAD FINDINGS Brain: No intracranial hemorrhage or CT evidence of large acute infarct. Chronic microvascular changes. Global atrophy. No intracranial mass lesion noted on this unenhanced exam. Vascular: Vascular calcifications Skull: No skull fracture Sinuses/Orbits: No acute orbital abnormality. Moderate mucosal thickening right maxillary sinus. Minimal mucosal thickening ethmoid sinus air cells. Partial opacification posterior right sphenoid sinus. Other: Mastoid air cells and middle ear cavities are clear. CT CERVICAL SPINE FINDINGS Alignment: Minimal anterior slip C4 and C7 similar to prior exam. Skull base and vertebrae: No acute cervical spine fracture. Soft tissues and spinal canal: No abnormal prevertebral soft tissue swelling. Disc levels: Multilevel cervical spondylotic changes most prominent C5-6 and C6-7. Transverse ligament hypertrophy with partial calcification. Upper  chest: Scarring lung apices greater on right. Other: Aortic calcification and carotid artery calcification. IMPRESSION: No skull fracture or intracranial hemorrhage. Chronic microvascular changes without CT evidence of large acute infarct. Atrophy. No acute cervical spine fracture. Alignment similar to prior exam. No abnormal prevertebral soft tissue swelling. Multifactorial cervical spondylotic changes most notable C5-6 and C6-7. Paranasal sinus mucosal thickening/opacification most notable involving the right maxillary sinus. Aortic Atherosclerosis (ICD10-I70.0). Electronically Signed   By: Genia Del M.D.   On: 09/17/2017 12:48   Ct Cervical Spine Wo Contrast  Result Date: 09/17/2017 CLINICAL DATA:  84 year old male found on floor. Patient does not remember what happened. Denies pain. Initial encounter. EXAM: CT HEAD WITHOUT CONTRAST CT CERVICAL SPINE WITHOUT CONTRAST TECHNIQUE: Multidetector CT imaging of the head and cervical spine was performed following the standard protocol without intravenous contrast. Multiplanar CT image reconstructions of the cervical spine were also generated. COMPARISON:  07/12/2015 CT. FINDINGS: CT HEAD FINDINGS Brain: No intracranial hemorrhage or CT evidence of large acute infarct. Chronic microvascular changes. Global atrophy. No intracranial mass lesion noted on this unenhanced exam. Vascular: Vascular calcifications Skull: No skull fracture Sinuses/Orbits: No acute orbital abnormality. Moderate mucosal thickening right maxillary sinus. Minimal mucosal thickening ethmoid sinus air cells. Partial opacification posterior right sphenoid sinus. Other: Mastoid air cells and middle ear cavities are clear. CT CERVICAL SPINE FINDINGS Alignment: Minimal anterior slip C4 and C7 similar to prior exam. Skull base and vertebrae: No acute cervical spine fracture. Soft tissues and spinal canal: No abnormal prevertebral soft tissue swelling. Disc levels: Multilevel cervical spondylotic  changes most prominent  C5-6 and C6-7. Transverse ligament hypertrophy with partial calcification. Upper chest: Scarring lung apices greater on right. Other: Aortic calcification and carotid artery calcification. IMPRESSION: No skull fracture or intracranial hemorrhage. Chronic microvascular changes without CT evidence of large acute infarct. Atrophy. No acute cervical spine fracture. Alignment similar to prior exam. No abnormal prevertebral soft tissue swelling. Multifactorial cervical spondylotic changes most notable C5-6 and C6-7. Paranasal sinus mucosal thickening/opacification most notable involving the right maxillary sinus. Aortic Atherosclerosis (ICD10-I70.0). Electronically Signed   By: Genia Del M.D.   On: 09/17/2017 12:48    EKG: Independently reviewed.  Assessment/Plan Active Problems:   Malignant melanoma of skin of upper limb, including shoulder ([facility])   Hypothyroidism   B12 deficiency   Dyslipidemia   Essential hypertension   Coronary atherosclerosis   GERD   Fall at home   Neuropathy of both feet   Acute encephalopathy   Elevated troponin   Acute Confusional State, unclear etiology, infection, versus drug-induced, unlikely sepsis.  CT of the head negative for acute intracranial abnormalities.  No seizures are noted.  The patient is afebrile, white count is normal, vital signs are normal.  Unclear if this  was preceded by his presyncopal episode as the patient does not have a recall of falling, but has been found on the floor.EKG sinus rhythm, unchanged from prior.  Troponin 0 0.36.  Lactic acid was 2.76, in addition, sodium 147. Admit to inpatient telemetry Frequent neuro check Urine Drug Screen Lactic acid Check TSH,B12 level, check ammonia levels Follow blood cultures  BMET monitoring Hold home oral  sedative medications PT/OT unable to be more interactive IV fluids at half-normal saline at 75 cc an hour, in view of his sodium of 148.  Hyperlipidemia Continue home  statins   GERD, no acute symptoms Continue PPI   Elevated bilirubin, in the setting of  dehydration, currently at 1.8.  AST is 48, no jaundice was noted.   Hold tylenol  IVF at half-normal saline at 75 cc an hour Repeat CMET in am   Hypertension BP 165/75   Pulse 69   Controlled Continue home anti-hypertensive medications    Hypothyroidism: Continue home Synthroid Check TSH and T4    Hypernatremia in the setting of dehydration and poor oral intake over the last 1 or 2 days, sodium 148  Cr  on admission 1.05   Patient has received IV fluids at 125 cc an hour of normal saline, Monitor BMET     Elevated lactate. Etiology unclear urinalysis and chest x-ray without indication of infectious process. Lactate  on presentation to ED trended upward to after 3 L of fluid. Rectal temp . Admit to medical floor Continue IV fluids repeat lactic acid blood cultures  Elevated Troponin, in a patient with a history of CAD status post CABG in 2010, heart score 4.  Patient has been falling x2 over the last 2 days, most recent fall during this admission, unclear how long the patient has been on the floor, and is also unclear if the patient did have a loss of consciousness.  Initial troponin is 0.36, CK is pending. Telemetry Cycle troponin Serial EKG Gentle IV fluids Follow CK results Hold diuretics for now Outpatient follow-up with cardiology   GERD, no acute symptoms Continue PPI   DVT prophylaxis: CD in view of frequent falls Code Status:    Full code Family Communication:  Discussed with patient's family Disposition Plan: Expect patient to be discharged to home after condition improves Consults called:  None Admission status:  Tele IP    Sharene Butters, PA-C [facility]   Amion text  [phone]   09/17/2017, 3:00 PM

## 2017-09-17 NOTE — ED Notes (Signed)
In and out performed  With Mickel Baas EMT

## 2017-09-18 ENCOUNTER — Other Ambulatory Visit: Payer: Self-pay

## 2017-09-18 ENCOUNTER — Inpatient Hospital Stay (HOSPITAL_COMMUNITY): Payer: Medicare Other

## 2017-09-18 DIAGNOSIS — E860 Dehydration: Secondary | ICD-10-CM | POA: Diagnosis not present

## 2017-09-18 DIAGNOSIS — G9340 Encephalopathy, unspecified: Secondary | ICD-10-CM | POA: Diagnosis not present

## 2017-09-18 DIAGNOSIS — R079 Chest pain, unspecified: Secondary | ICD-10-CM | POA: Diagnosis not present

## 2017-09-18 LAB — BLOOD CULTURE ID PANEL (REFLEXED)
Acinetobacter baumannii: NOT DETECTED
CANDIDA ALBICANS: NOT DETECTED
CANDIDA GLABRATA: NOT DETECTED
CANDIDA PARAPSILOSIS: NOT DETECTED
CANDIDA TROPICALIS: NOT DETECTED
Candida krusei: NOT DETECTED
ENTEROBACTER CLOACAE COMPLEX: NOT DETECTED
ENTEROBACTERIACEAE SPECIES: NOT DETECTED
Enterococcus species: NOT DETECTED
Escherichia coli: NOT DETECTED
Haemophilus influenzae: NOT DETECTED
KLEBSIELLA OXYTOCA: NOT DETECTED
KLEBSIELLA PNEUMONIAE: NOT DETECTED
Listeria monocytogenes: NOT DETECTED
Methicillin resistance: DETECTED — AB
Neisseria meningitidis: NOT DETECTED
Proteus species: NOT DETECTED
Pseudomonas aeruginosa: NOT DETECTED
STREPTOCOCCUS PNEUMONIAE: NOT DETECTED
STREPTOCOCCUS PYOGENES: NOT DETECTED
STREPTOCOCCUS SPECIES: NOT DETECTED
Serratia marcescens: NOT DETECTED
Staphylococcus aureus (BCID): NOT DETECTED
Staphylococcus species: DETECTED — AB
Streptococcus agalactiae: NOT DETECTED

## 2017-09-18 LAB — CBC
HCT: 40.4 % (ref 39.0–52.0)
Hemoglobin: 13.3 g/dL (ref 13.0–17.0)
MCH: 33.2 pg (ref 26.0–34.0)
MCHC: 32.9 g/dL (ref 30.0–36.0)
MCV: 100.7 fL — AB (ref 78.0–100.0)
PLATELETS: 194 10*3/uL (ref 150–400)
RBC: 4.01 MIL/uL — ABNORMAL LOW (ref 4.22–5.81)
RDW: 13.1 % (ref 11.5–15.5)
WBC: 8.5 10*3/uL (ref 4.0–10.5)

## 2017-09-18 LAB — BASIC METABOLIC PANEL
Anion gap: 6 (ref 5–15)
BUN: 21 mg/dL (ref 8–23)
CHLORIDE: 111 mmol/L (ref 98–111)
CO2: 27 mmol/L (ref 22–32)
CREATININE: 0.92 mg/dL (ref 0.61–1.24)
Calcium: 8.7 mg/dL — ABNORMAL LOW (ref 8.9–10.3)
GFR calc Af Amer: >60 mL/min (ref >60)
GFR calc non Af Amer: >60 mL/min (ref >60)
Glucose, Bld: 87 mg/dL (ref 70–99)
Potassium: 3.9 mmol/L (ref 3.5–5.1)
SODIUM: 144 mmol/L (ref 135–145)

## 2017-09-18 LAB — TROPONIN I
Troponin I: 1.07 ng/mL (ref <0.03)
Troponin I: 0.99 ng/mL (ref <0.03)
Troponin I: 0.79 ng/mL (ref <0.03)

## 2017-09-18 LAB — PROCALCITONIN: Procalcitonin: <0.1 ng/mL

## 2017-09-18 LAB — ECHOCARDIOGRAM COMPLETE
HEIGHTINCHES: 72 in
WEIGHTICAEL: 3040 [oz_av]

## 2017-09-18 NOTE — Evaluation (Signed)
Occupational Therapy Evaluation Patient Details Name: Barry Molina MRN: [id number] DOB: 06-25-1925 Today's Date: 09/18/2017    History of Present Illness 84 y.o. male with medical history significant for hypertension, hyperlipidemia, CAD status post CABG, brought to the hospital today after family noticing a change in behavior. Pt was found the morning of 7/22 on the floor. Pt was hemodynamically stable but confused on presentation to ED. Admitted for further work-up.   Clinical Impression   This 84 y/o male presents with the above. At baseline pt lives alone, using RW for functional mobility and reports completing ADLs without assist. Pt has family who check in intermittently and has an aide who assists with iADL tasks 5 days/wk. Pt able to take few steps this session using RW to transfer OOB to recliner with minA. Currently requires minguard assist for seated UB ADLs, modA for LB ADLs. Pt's daughter present end of session, reports most of the family works during the day, but is able to provide 24hr assist if necessary. Pt will benefit from continued acute OT services and currently recommend follow up therapy services in SNF setting prior to return home to maximize his overall safety and independence with ADLs and mobility, and to decrease level of caregiver burden after return home. Will follow.     Follow Up Recommendations  SNF;Supervision/Assistance - 24 hour(pending progress, may be able to return home with [facility] )    Equipment Recommendations  3 in 1 bedside commode;Other (comment)(to be further assessed )           Precautions / Restrictions Precautions Precautions: Fall Restrictions Weight Bearing Restrictions: No      Mobility Bed Mobility Overal bed mobility: Needs Assistance Bed Mobility: Supine to Sit     Supine to sit: Min assist     General bed mobility comments: minA to support trunk into upright position   Transfers Overall transfer level: Needs  assistance Equipment used: Rolling walker (2 wheeled) Transfers: Sit to/from [facility] Sit to Stand: Min assist Stand pivot transfers: Min assist       General transfer comment: assist to rise and steady at RW, pt able to take few steps to turn towards recliner using RW, requires increased cues when transitioning into sitting as pt somewhat "plopping" into chair without use of UE support     Balance Overall balance assessment: Needs assistance Sitting-balance support: Feet supported Sitting balance-Leahy Scale: Good     Standing balance support: Bilateral upper extremity supported Standing balance-Leahy Scale: Poor Standing balance comment: reliant on UE support                            ADL either performed or assessed with clinical judgement   ADL Overall ADL's : Needs assistance/impaired Eating/Feeding: Sitting;Modified independent   Grooming: Set up;Sitting   Upper Body Bathing: Min guard;Sitting   Lower Body Bathing: Minimal assistance;Sit to/from stand   Upper Body Dressing : Min guard;Sitting Upper Body Dressing Details (indicate cue type and reason): pt able to doff/don new gown, with assist only for buttoning sleeve around UE  Lower Body Dressing: Moderate assistance;Sit to/from stand   Toilet Transfer: Minimal assistance;Stand-pivot;BSC;RW Armed forces technical officer Details (indicate cue type and reason): simulated in transfer to [place] and Hygiene: Moderate assistance;Sit to/from stand       Functional mobility during ADLs: Minimal assistance;Rolling walker       Vision  Perception     Praxis      Pertinent Vitals/Pain Pain Assessment: No/denies pain     Hand Dominance Right   Extremity/Trunk Assessment Upper Extremity Assessment Upper Extremity Assessment: Generalized weakness;LUE deficits/detail LUE Deficits / Details: decreased ROM at baseline due to previous injury    Lower  Extremity Assessment Lower Extremity Assessment: Defer to PT evaluation       Communication Communication Communication: No difficulties   Cognition Arousal/Alertness: Awake/alert Behavior During Therapy: WFL for tasks assessed/performed Overall Cognitive Status: Impaired/Different from baseline Area of Impairment: Orientation;Attention;Memory;Following commands                 Orientation Level: Disoriented to;Time Current Attention Level: Selective Memory: Decreased short-term memory Following Commands: Follows one step commands with increased time;Follows one step commands consistently       General Comments: pt disoriented to month and year, initially upon entering room pt reports he needs to get the phone number to call his wife (per chart review pt lives alone), daughter arriving during session and pt then stating "there she is"    General Comments       Exercises     Shoulder Instructions      Home Living Family/patient expects to be discharged to:: Private residence Living Arrangements: Alone Available Help at Discharge: Family;Available PRN/intermittently Type of Home: House Home Access: Stairs to enter     Home Layout: One level     Bathroom Shower/Tub: Occupational psychologist: Standard     Home Equipment: Environmental consultant - 2 wheels          Prior Functioning/Environment Level of Independence: Independent with assistive device(s);Needs assistance  Gait / Transfers Assistance Needed: pt reports using RW for ambulation ADL's / Homemaking Assistance Needed: reports mod independence with ADLs, has an aide who assists in the home from 10-5pm 5x/wk - assists with iADLs             OT Problem List: Decreased strength;Impaired balance (sitting and/or standing);Decreased activity tolerance;Decreased knowledge of use of DME or AE;Decreased cognition      OT Treatment/Interventions: Self-care/ADL training;DME and/or AE instruction;Therapeutic  activities;Balance training;Therapeutic exercise;Patient/family education    OT Goals(Current goals can be found in the care plan section) Acute Rehab OT Goals Patient Stated Goal: return home OT Goal Formulation: With patient Time For Goal Achievement: 10/02/17 Potential to Achieve Goals: Good  OT Frequency: Min 2X/week   Barriers to D/C:            Co-evaluation              AM-PAC PT "6 Clicks" Daily Activity     Outcome Measure Help from another person eating meals?: None Help from another person taking care of personal grooming?: A Little Help from another person toileting, which includes using toliet, bedpan, or urinal?: A Lot Help from another person bathing (including washing, rinsing, drying)?: A Lot Help from another person to put on and taking off regular upper body clothing?: None Help from another person to put on and taking off regular lower body clothing?: A Lot 6 Click Score: 17   End of Session Equipment Utilized During Treatment: Gait belt;Rolling walker Nurse Communication: Mobility status  Activity Tolerance: Patient tolerated treatment well Patient left: in chair;with call bell/phone within reach;with chair alarm set  OT Visit Diagnosis: Unsteadiness on feet (R26.81);Muscle weakness (generalized) (M62.81);Other symptoms and signs involving cognitive function  Time: 3128-1188 OT Time Calculation (min): 26 min Charges:  OT General Charges $OT Visit: 1 Visit OT Evaluation $OT Eval Moderate Complexity: 1 Mod OT Treatments $Self Care/Home Management : 8-22 mins G-Codes:     Lou Cal, OT Pager [phone] 09/18/2017   [name] 09/18/2017, 4:44 PM

## 2017-09-18 NOTE — Progress Notes (Signed)
  Echocardiogram 2D Echocardiogram has been performed.  Barry Molina 09/18/2017, 2:39 PM

## 2017-09-18 NOTE — Progress Notes (Signed)
PROGRESS NOTE    Barry Molina  HMC:[id number] DOB: 1926/01/27 DOA: 09/17/2017 PCP: Cassandria Anger, MD   Brief Narrative: Patient is a 84 year old male with past medical history of hypertension, hyperlipidemia, coronary artery disease, status post CABG who was brought to the emergency department from home after he was found on the floor.  Patient lives by himself.  Hemodynamically stable but confused on presentation .Admitted for further work-up.  Assessment & Plan:   Principal Problem:   Acute encephalopathy Active Problems:   Malignant melanoma of skin of upper limb, including shoulder (HCC)   Hypothyroidism   B12 deficiency   Dyslipidemia   Essential hypertension   Coronary atherosclerosis   GERD   Fall at home   Neuropathy of both feet   Elevated troponin   Confusion   Acute hypernatremia   Acute encephalopathy: Currently  his mental status has improved and is at baseline this morning.  Alert and oriented.  Probably was dehydrated on presentation.  Might have mild component of dementia also.  CT has was not so history of acute intracranial abnormalities.  No seizures were noted.  Patient was afebrile on presentation.  UDS not impressive.  TSH, vitamin B12 level normal.  Dehydration/hypernatremia: Initially given 2 L of bolus of normal saline on presentation.  Later on restarted on half-normal insulin for hyponatremia.  He looked dehydrated on presentation.  Currently hemodynamically stable.  Continue gentle hydration with half-normal saline until tomorrow and reassess.  Fall at home: Lives by himself.  Ambulance with the help of walker.  Fall could be multifactorial.  He could be dehydrated/weak tripped on something.  Patient does not remember the exact events before the fall.  Will request for physical therapy evaluation.  He does not have any injuries or ulcers after the fall.  Elevated lactic acid: Present on admission.  Sepsis is less likely.  Lactic acidosis resolved  with IV fluids.  Chronic pain : Complains of pain on his left shoulder on and off.  His shoulder is currently not inflamed or tender.  Positive blood culture: 1 of the blood cultures is growing coagulase negative staph .  This is most likely a contamination.  Patient is afebrile.  We will continue to follow the final report.  Elevated troponin: Most likely secondary to supply demand ischemia.  Also noted to have elevated CK level most likely secondary to fall which could have contributed.  Does not complain of any chest pain.  Will not pursue for any intervention for this.  Troponin has not trended up.  We will follow-up echocardiogram.  Hyperlipidemia: Continue statin  History of hypertension: Currently normotensive.  Continue home antihypertensives.  Hypothyroidism: Continue Synthyroid.  Gross hematuria: New finding.  Urine was noted to be bloody.  Urinalysis suggest RBCs.  Will get CT of abdomen and pelvis.    DVT prophylaxis: SCD Code Status: Full Family Communication: Daughter present at the bedside Disposition Plan: Pending PT evaluation   Consultants: None  Procedures: None  Antimicrobials: None  Subjective: Patient seen and examined the bedside this morning.  Remains comfortable.  Hemodynamically stable.  Mental status has returned to baseline.  Objective: Vitals:   09/18/17 0438 09/18/17 0740 09/18/17 1034 09/18/17 1151  BP: 121/69 140/72  134/82  Pulse: 67 79  73  Resp: 18 20  20   Temp: 98 F (36.7 C) 97.9 F (36.6 C) 98.4 F (36.9 C) 97.9 F (36.6 C)  TempSrc:  Oral Oral Oral  SpO2: 93% 92%  93%  Weight:      Height:        Intake/Output Summary (Last 24 hours) at 09/18/2017 1326 Last data filed at 09/18/2017 0800 Gross per 24 hour  Intake 1923.24 ml  Output 100 ml  Net 1823.24 ml   Filed Weights   09/17/17 1044  Weight: 86.2 kg (190 lb)    Examination:  General exam: Appears calm and comfortable ,Not in distress,elderly male HEENT:PERRL,Oral  mucosa moist, Ear/Nose normal on gross exam Respiratory system: Bilateral equal air entry, normal vesicular breath sounds, no wheezes or crackles  Cardiovascular system: S1 & S2 heard, RRR. No JVD, murmurs, rubs, gallops or clicks. No pedal edema. Gastrointestinal system: Abdomen is nondistended, soft and nontender. No organomegaly or masses felt. Normal bowel sounds heard. Central nervous system: Alert and oriented. No focal neurological deficits. Extremities: No edema, no clubbing ,no cyanosis, distal peripheral pulses palpable. Skin: No rashes, lesions or ulcers,no icterus ,no pallor MSK: Normal muscle bulk,tone ,power Psychiatry: Judgement and insight appear normal. Mood & affect appropriate.     Data Reviewed: I have personally reviewed following labs and imaging studies  CBC: Recent Labs  Lab 09/17/17 1059 09/18/17 0407  WBC 9.6 8.5  HGB 15.1 13.3  HCT 45.9 40.4  MCV 99.4 100.7*  PLT 221 347   Basic Metabolic Panel: Recent Labs  Lab 09/17/17 1059 09/18/17 0407  NA 147* 144  K 3.7 3.9  CL 111 111  CO2 25 27  GLUCOSE 132* 87  BUN 22 21  CREATININE 1.05 0.92  CALCIUM 9.5 8.7*   GFR: Estimated Creatinine Clearance: 58.6 mL/min (by C-G formula based on SCr of 0.92 mg/dL). Liver Function Tests: Recent Labs  Lab 09/17/17 1059  AST 48*  ALT 19  ALKPHOS 62  BILITOT 1.8*  PROT 6.9  ALBUMIN 3.8   No results for input(s): LIPASE, AMYLASE in the last 168 hours. Recent Labs  Lab 09/17/17 1706  AMMONIA 16   Coagulation Profile: No results for input(s): INR, PROTIME in the last 168 hours. Cardiac Enzymes: Recent Labs  Lab 09/17/17 1706 09/17/17 1847 09/18/17 0836  CKTOTAL 1,159*  --   --   CKMB 11.0*  --   --   TROPONINI  --  1.03* 1.07*   BNP (last 3 results) No results for input(s): PROBNP in the last 8760 hours. HbA1C: No results for input(s): HGBA1C in the last 72 hours. CBG: Recent Labs  Lab 09/17/17 1133  GLUCAP 131*   Lipid Profile: No  results for input(s): CHOL, HDL, LDLCALC, TRIG, CHOLHDL, LDLDIRECT in the last 72 hours. Thyroid Function Tests: Recent Labs    09/17/17 1706  TSH 1.616  FREET4 0.97   Anemia Panel: Recent Labs    09/17/17 1706  VITAMINB12 1,015*   Sepsis Labs: Recent Labs  Lab 09/17/17 1431 09/17/17 1706 09/17/17 1847 09/18/17 0407  PROCALCITON  --   --  <0.10 <0.10  LATICACIDVEN 2.76* 1.6  --   --     Recent Results (from the past 240 hour(s))  Culture, blood (Routine X 2) w Reflex to ID Panel     Status: None (Preliminary result)   Collection Time: 09/17/17  5:06 PM  Result Value Ref Range Status   Specimen Description BLOOD LEFT ARM  Final   Special Requests   Final    BOTTLES DRAWN AEROBIC AND ANAEROBIC Blood Culture adequate volume Performed at [facility], [place] [address] [place], [place] [address]    Culture  Setup Time  Final    GRAM POSITIVE COCCI IN BOTH AEROBIC AND ANAEROBIC BOTTLES Organism ID to follow CRITICAL RESULT CALLED TO, READ BACK BY AND VERIFIED WITH: Jamse Mead PharmD 10:15 09/18/17 ([email or web address])    Culture PENDING  Incomplete   Report Status PENDING  Incomplete  Blood Culture ID Panel (Reflexed)     Status: Abnormal   Collection Time: 09/17/17  5:06 PM  Result Value Ref Range Status   Enterococcus species NOT DETECTED NOT DETECTED Final   Listeria monocytogenes NOT DETECTED NOT DETECTED Final   Staphylococcus species DETECTED (A) NOT DETECTED Final    Comment: Methicillin (oxacillin) resistant coagulase negative staphylococcus. Possible blood culture contaminant (unless isolated from more than one blood culture draw or clinical case suggests pathogenicity). No antibiotic treatment is indicated for blood  culture contaminants. CRITICAL RESULT CALLED TO, READ BACK BY AND VERIFIED WITH: E. Deja PharmD 10:15 09/18/17 ([email or web address])    Staphylococcus aureus NOT DETECTED NOT DETECTED Final   Methicillin resistance DETECTED (A) NOT DETECTED Final    Comment: CRITICAL  RESULT CALLED TO, READ BACK BY AND VERIFIED WITH: E. Deja PharmD 10:15 09/18/17 ([email or web address])    Streptococcus species NOT DETECTED NOT DETECTED Final   Streptococcus agalactiae NOT DETECTED NOT DETECTED Final   Streptococcus pneumoniae NOT DETECTED NOT DETECTED Final   Streptococcus pyogenes NOT DETECTED NOT DETECTED Final   Acinetobacter baumannii NOT DETECTED NOT DETECTED Final   Enterobacteriaceae species NOT DETECTED NOT DETECTED Final   Enterobacter cloacae complex NOT DETECTED NOT DETECTED Final   Escherichia coli NOT DETECTED NOT DETECTED Final   Klebsiella oxytoca NOT DETECTED NOT DETECTED Final   Klebsiella pneumoniae NOT DETECTED NOT DETECTED Final   Proteus species NOT DETECTED NOT DETECTED Final   Serratia marcescens NOT DETECTED NOT DETECTED Final   Haemophilus influenzae NOT DETECTED NOT DETECTED Final   Neisseria meningitidis NOT DETECTED NOT DETECTED Final   Pseudomonas aeruginosa NOT DETECTED NOT DETECTED Final   Candida albicans NOT DETECTED NOT DETECTED Final   Candida glabrata NOT DETECTED NOT DETECTED Final   Candida krusei NOT DETECTED NOT DETECTED Final   Candida parapsilosis NOT DETECTED NOT DETECTED Final   Candida tropicalis NOT DETECTED NOT DETECTED Final         Radiology Studies: Dg Chest 2 View  Result Date: 09/17/2017 CLINICAL DATA:  Syncope EXAM: CHEST - 2 VIEW COMPARISON:  04/02/2016 and prior exams FINDINGS: Cardiomegaly and CABG changes again noted. Elevation of the LEFT hemidiaphragm is unchanged. There is no evidence of focal airspace disease, pulmonary edema, suspicious pulmonary nodule/mass, pleural effusion, or pneumothorax. No acute bony abnormalities are identified. IMPRESSION: Cardiomegaly without evidence of acute cardiopulmonary disease. Electronically Signed   By: Margarette Canada M.D.   On: 09/17/2017 12:12   Ct Head Wo Contrast  Result Date: 09/17/2017 CLINICAL DATA:  84 year old male found on floor. Patient does not remember what happened.  Denies pain. Initial encounter. EXAM: CT HEAD WITHOUT CONTRAST CT CERVICAL SPINE WITHOUT CONTRAST TECHNIQUE: Multidetector CT imaging of the head and cervical spine was performed following the standard protocol without intravenous contrast. Multiplanar CT image reconstructions of the cervical spine were also generated. COMPARISON:  07/12/2015 CT. FINDINGS: CT HEAD FINDINGS Brain: No intracranial hemorrhage or CT evidence of large acute infarct. Chronic microvascular changes. Global atrophy. No intracranial mass lesion noted on this unenhanced exam. Vascular: Vascular calcifications Skull: No skull fracture Sinuses/Orbits: No acute orbital abnormality. Moderate mucosal thickening right maxillary sinus. Minimal mucosal thickening ethmoid sinus air cells. Partial opacification posterior right sphenoid  sinus. Other: Mastoid air cells and middle ear cavities are clear. CT CERVICAL SPINE FINDINGS Alignment: Minimal anterior slip C4 and C7 similar to prior exam. Skull base and vertebrae: No acute cervical spine fracture. Soft tissues and spinal canal: No abnormal prevertebral soft tissue swelling. Disc levels: Multilevel cervical spondylotic changes most prominent C5-6 and C6-7. Transverse ligament hypertrophy with partial calcification. Upper chest: Scarring lung apices greater on right. Other: Aortic calcification and carotid artery calcification. IMPRESSION: No skull fracture or intracranial hemorrhage. Chronic microvascular changes without CT evidence of large acute infarct. Atrophy. No acute cervical spine fracture. Alignment similar to prior exam. No abnormal prevertebral soft tissue swelling. Multifactorial cervical spondylotic changes most notable C5-6 and C6-7. Paranasal sinus mucosal thickening/opacification most notable involving the right maxillary sinus. Aortic Atherosclerosis (ICD10-I70.0). Electronically Signed   By: Genia Del M.D.   On: 09/17/2017 12:48   Ct Cervical Spine Wo Contrast  Result Date:  09/17/2017 CLINICAL DATA:  84 year old male found on floor. Patient does not remember what happened. Denies pain. Initial encounter. EXAM: CT HEAD WITHOUT CONTRAST CT CERVICAL SPINE WITHOUT CONTRAST TECHNIQUE: Multidetector CT imaging of the head and cervical spine was performed following the standard protocol without intravenous contrast. Multiplanar CT image reconstructions of the cervical spine were also generated. COMPARISON:  07/12/2015 CT. FINDINGS: CT HEAD FINDINGS Brain: No intracranial hemorrhage or CT evidence of large acute infarct. Chronic microvascular changes. Global atrophy. No intracranial mass lesion noted on this unenhanced exam. Vascular: Vascular calcifications Skull: No skull fracture Sinuses/Orbits: No acute orbital abnormality. Moderate mucosal thickening right maxillary sinus. Minimal mucosal thickening ethmoid sinus air cells. Partial opacification posterior right sphenoid sinus. Other: Mastoid air cells and middle ear cavities are clear. CT CERVICAL SPINE FINDINGS Alignment: Minimal anterior slip C4 and C7 similar to prior exam. Skull base and vertebrae: No acute cervical spine fracture. Soft tissues and spinal canal: No abnormal prevertebral soft tissue swelling. Disc levels: Multilevel cervical spondylotic changes most prominent C5-6 and C6-7. Transverse ligament hypertrophy with partial calcification. Upper chest: Scarring lung apices greater on right. Other: Aortic calcification and carotid artery calcification. IMPRESSION: No skull fracture or intracranial hemorrhage. Chronic microvascular changes without CT evidence of large acute infarct. Atrophy. No acute cervical spine fracture. Alignment similar to prior exam. No abnormal prevertebral soft tissue swelling. Multifactorial cervical spondylotic changes most notable C5-6 and C6-7. Paranasal sinus mucosal thickening/opacification most notable involving the right maxillary sinus. Aortic Atherosclerosis (ICD10-I70.0). Electronically  Signed   By: Genia Del M.D.   On: 09/17/2017 12:48        Scheduled Meds: . aspirin  325 mg Oral Daily  . levothyroxine  125 mcg Oral QAC breakfast  . metoprolol succinate  12.5 mg Oral Daily  . pantoprazole  40 mg Oral Daily  . cyanocobalamin  1,000 mcg Oral Daily   Continuous Infusions: . sodium chloride 75 mL/hr at 09/18/17 0800     LOS: 1 day    Time spent: More than 50% of that time was spent in counseling and/or coordination of care.      Shelly Coss, MD [facility] Pager [phone]  If 7PM-7AM, please contact night-coverage [email or web address] Password [email or web address] 09/18/2017, 1:26 PM

## 2017-09-18 NOTE — Progress Notes (Signed)
Troponin 1.03.  Also, dark bloody urine output. NP notified. EKG completed in chart.

## 2017-09-18 NOTE — Progress Notes (Signed)
PHARMACY - PHYSICIAN COMMUNICATION CRITICAL VALUE ALERT - BLOOD CULTURE IDENTIFICATION (BCID)  Barry Molina is an 84 y.o. male who presented to [facility] on 09/17/2017 with a chief complaint of "not feeling well." Per family, patient has been confused and chilled.  Assessment: ID w/u negative thus far. WBC WNL, AF. PC < 0.10. LA 2.76>>1.6. CoNS likely contaminant.  Name of physician (or Provider) Contacted: Adhikari  Current antibiotics: None  Changes to prescribed antibiotics recommended: None - continue to monitor off of antibiotics, as infection is not consistent with patient presentation. Recommendations accepted by provider.  Results for orders placed or performed during the hospital encounter of 09/17/17  Blood Culture ID Panel (Reflexed) (Collected: 09/17/2017  5:06 PM)  Result Value Ref Range   Enterococcus species NOT DETECTED NOT DETECTED   Listeria monocytogenes NOT DETECTED NOT DETECTED   Staphylococcus species DETECTED (A) NOT DETECTED   Staphylococcus aureus NOT DETECTED NOT DETECTED   Methicillin resistance DETECTED (A) NOT DETECTED   Streptococcus species NOT DETECTED NOT DETECTED   Streptococcus agalactiae NOT DETECTED NOT DETECTED   Streptococcus pneumoniae NOT DETECTED NOT DETECTED   Streptococcus pyogenes NOT DETECTED NOT DETECTED   Acinetobacter baumannii NOT DETECTED NOT DETECTED   Enterobacteriaceae species NOT DETECTED NOT DETECTED   Enterobacter cloacae complex NOT DETECTED NOT DETECTED   Escherichia coli NOT DETECTED NOT DETECTED   Klebsiella oxytoca NOT DETECTED NOT DETECTED   Klebsiella pneumoniae NOT DETECTED NOT DETECTED   Proteus species NOT DETECTED NOT DETECTED   Serratia marcescens NOT DETECTED NOT DETECTED   Haemophilus influenzae NOT DETECTED NOT DETECTED   Neisseria meningitidis NOT DETECTED NOT DETECTED   Pseudomonas aeruginosa NOT DETECTED NOT DETECTED   Candida albicans NOT DETECTED NOT DETECTED   Candida glabrata NOT DETECTED NOT  DETECTED   Candida krusei NOT DETECTED NOT DETECTED   Candida parapsilosis NOT DETECTED NOT DETECTED   Candida tropicalis NOT DETECTED NOT DETECTED   Barry Molina, PharmD PGY2 Infectious Disease Pharmacy Resident Phone: [phone] 09/18/2017  11:40 AM

## 2017-09-18 NOTE — Plan of Care (Signed)
No anxiety noted at this time. Will continue to monitor.  

## 2017-09-19 ENCOUNTER — Inpatient Hospital Stay (HOSPITAL_COMMUNITY): Payer: Medicare Other

## 2017-09-19 ENCOUNTER — Other Ambulatory Visit: Payer: Self-pay

## 2017-09-19 ENCOUNTER — Encounter (HOSPITAL_COMMUNITY): Payer: Self-pay | Admitting: General Practice

## 2017-09-19 DIAGNOSIS — E860 Dehydration: Secondary | ICD-10-CM | POA: Diagnosis not present

## 2017-09-19 DIAGNOSIS — G9340 Encephalopathy, unspecified: Secondary | ICD-10-CM | POA: Diagnosis not present

## 2017-09-19 LAB — PROCALCITONIN: Procalcitonin: <0.1 ng/mL

## 2017-09-19 LAB — CK: CK TOTAL: 753 U/L — AB (ref 49–397)

## 2017-09-19 MED ORDER — IOPAMIDOL (ISOVUE-300) INJECTION 61%
30.0000 mL | Freq: Once | INTRAVENOUS | Status: AC | PRN
  Administered 2017-09-19: 30 mL via ORAL

## 2017-09-19 MED ORDER — TAMSULOSIN HCL 0.4 MG PO CAPS
0.4000 mg | ORAL_CAPSULE | Freq: Every day | ORAL | Status: DC
  Administered 2017-09-19 – 2017-09-20 (×2): 0.4 mg via ORAL
  Filled 2017-09-19 (×2): qty 1

## 2017-09-19 MED ORDER — QUETIAPINE FUMARATE 25 MG PO TABS
25.0000 mg | ORAL_TABLET | Freq: Every day | ORAL | Status: DC
  Administered 2017-09-19: 25 mg via ORAL
  Filled 2017-09-19: qty 1

## 2017-09-19 NOTE — Evaluation (Signed)
Physical Therapy Evaluation Patient Details Name: Barry Molina MRN: [id number] DOB: 29-Sep-1925 Today's Date: 09/19/2017   History of Present Illness  84 y.o. male with medical history significant for hypertension, hyperlipidemia, CAD status post CABG, brought to the hospital today after family noticing a change in behavior. Pt was found the morning of 7/22 on the floor. Pt was hemodynamically stable but confused on presentation to ED. Admitted for further work-up.  Clinical Impression  Barry Molina is a pleasant 84 y.o male admitted with the above listed diagnosis. Patient received in bed and motivated to work with PT this AM. Patient requiring up to [facility] for all bed mobility, transfers, and mobility with RW. Consistent verbal cueing throughout session for safety with mobility with limited carryover. Seemingly confused at times during session, stating he needs to call his wife. PT currently recommending SNF at discharge due to reduced safe and independent functional mobility. PT to continue to follow acutely.    Follow Up Recommendations SNF;Supervision/Assistance - 24 hour    Equipment Recommendations  Other (comment)(TBD)    Recommendations for Other Services       Precautions / Restrictions Precautions Precautions: Fall Restrictions Weight Bearing Restrictions: No      Mobility  Bed Mobility Overal bed mobility: Needs Assistance Bed Mobility: Supine to Sit     Supine to sit: Min assist     General bed mobility comments: Min A to pull up to EOB - for trunk control  Transfers Overall transfer level: Needs assistance Equipment used: Rolling walker (2 wheeled) Transfers: Sit to/from [facility] Sit to Stand: Min assist Stand pivot transfers: Min assist       General transfer comment: Min A to power up at bedside; prefers to pull up from RW requiring VC for safety; verbal cueing for hand placement prior to sitting   Ambulation/Gait Ambulation/Gait  assistance: Min assist;Min guard Gait Distance (Feet): 80 Feet Assistive device: Rolling walker (2 wheeled) Gait Pattern/deviations: Step-through pattern;Decreased stride length;Trunk flexed Gait velocity: decreased   General Gait Details: verbal cueing to increase safety with AD with excessive forward flexion with gait and mobility  Stairs            Wheelchair Mobility    Modified Rankin (Stroke Patients Only)       Balance Overall balance assessment: Needs assistance Sitting-balance support: Feet supported Sitting balance-Leahy Scale: Good     Standing balance support: Bilateral upper extremity supported;During functional activity Standing balance-Leahy Scale: Poor Standing balance comment: reliant on external support                             Pertinent Vitals/Pain Pain Assessment: No/denies pain    Home Living Family/patient expects to be discharged to:: Private residence Living Arrangements: Alone Available Help at Discharge: Family;Available PRN/intermittently Type of Home: House Home Access: Stairs to enter     Home Layout: One level Home Equipment: Walker - 2 wheels      Prior Function Level of Independence: Independent with assistive device(s);Needs assistance   Gait / Transfers Assistance Needed: pt reports using RW for ambulation  ADL's / Homemaking Assistance Needed: reports mod independence with ADLs, has an aide who assists in the home from 10-5pm 5x/wk - assists with iADLs         Hand Dominance   Dominant Hand: Right    Extremity/Trunk Assessment   Upper Extremity Assessment Upper Extremity Assessment: Defer to OT evaluation  Lower Extremity Assessment Lower Extremity Assessment: Generalized weakness    Cervical / Trunk Assessment Cervical / Trunk Assessment: Kyphotic  Communication   Communication: No difficulties  Cognition Arousal/Alertness: Awake/alert Behavior During Therapy: WFL for tasks  assessed/performed Overall Cognitive Status: No family/caregiver present to determine baseline cognitive functioning Area of Impairment: Orientation;Following commands;Memory;Safety/judgement;Problem solving                 Orientation Level: Disoriented to;Time   Memory: Decreased short-term memory Following Commands: Follows one step commands consistently;Follows one step commands with increased time Safety/Judgement: Decreased awareness of safety;Decreased awareness of deficits   Problem Solving: Slow processing;Requires verbal cues;Requires tactile cues General Comments: Patient repeatedly stating that he needs to call his wife to let her know where he is      General Comments      Exercises     Assessment/Plan    PT Assessment Patient needs continued PT services  PT Problem List Decreased strength;Decreased balance;Decreased activity tolerance;Decreased mobility;Decreased coordination;Decreased knowledge of use of DME;Decreased safety awareness;Decreased knowledge of precautions       PT Treatment Interventions DME instruction;Gait training;Functional mobility training;Therapeutic activities;Therapeutic exercise;Balance training;Neuromuscular re-education;Patient/family education    PT Goals (Current goals can be found in the Care Plan section)  Acute Rehab PT Goals Patient Stated Goal: return home PT Goal Formulation: With patient Time For Goal Achievement: 10/03/17 Potential to Achieve Goals: Fair    Frequency Min 2X/week   Barriers to discharge        Co-evaluation               AM-PAC PT "6 Clicks" Daily Activity  Outcome Measure Difficulty turning over in bed (including adjusting bedclothes, sheets and blankets)?: A Lot Difficulty moving from lying on back to sitting on the side of the bed? : Unable Difficulty sitting down on and standing up from a chair with arms (e.g., wheelchair, bedside commode, etc,.)?: Unable Help needed moving to and from  a bed to chair (including a wheelchair)?: A Little Help needed walking in hospital room?: A Little Help needed climbing 3-5 steps with a railing? : A Lot 6 Click Score: 12    End of Session Equipment Utilized During Treatment: Gait belt Activity Tolerance: Patient tolerated treatment well Patient left: in chair;with call bell/phone within reach;with chair alarm set;with nursing/sitter in room Nurse Communication: Mobility status PT Visit Diagnosis: Unsteadiness on feet (R26.81);Other abnormalities of gait and mobility (R26.89);Muscle weakness (generalized) (M62.81)    Time: 4431-5400 PT Time Calculation (min) (ACUTE ONLY): 18 min   Charges:   PT Evaluation $PT Eval Moderate Complexity: 1 Mod     PT G Codes:        Lanney Gins, PT, DPT 09/19/17 11:02 AM Pager: [phone]

## 2017-09-19 NOTE — Plan of Care (Signed)
Patient stable, discussed POC with patient and daughter, denies question/concerns at this time.

## 2017-09-19 NOTE — Progress Notes (Signed)
PROGRESS NOTE    Barry Molina  OHY:[id number] DOB: 1925/12/08 DOA: 09/17/2017 PCP: Cassandria Anger, MD   Brief Narrative: Patient is a 84 year old male with past medical history of hypertension, hyperlipidemia, coronary artery disease, status post CABG who was brought to the emergency department from home after he was found on the floor.  Patient lives by himself.  Found to be hemodynamically stable but confused on presentation .Admitted for further work-up. Currently his overall status is stable.  Waiting for skilled nursing facility bed.  Social worker consulted.  Assessment & Plan:   Principal Problem:   Acute encephalopathy Active Problems:   Malignant melanoma of skin of upper limb, including shoulder (HCC)   Hypothyroidism   B12 deficiency   Dyslipidemia   Essential hypertension   Coronary atherosclerosis   GERD   Fall at home   Neuropathy of both feet   Elevated troponin   Confusion   Acute hypernatremia   Acute encephalopathy: Currently  his mental status has improved and is at baseline this morning.  Alert and oriented.  Probably was dehydrated on presentation.  Might have mild component of dementia also.  CT has was not so history of acute intracranial abnormalities.  No seizures were noted.  Patient was afebrile on presentation.  UDS not impressive.  TSH, vitamin B12 level normal.  Dehydration/hypernatremia: Initially given 2 L of bolus of normal saline on presentation.  Later on restarted on half-normal insulin for hyponatremia.  He looked dehydrated on presentation.  Currently hemodynamically stable. IV fluids D/ced  Fall at home: Lives by himself.  Ambulance with the help of walker.  Fall could be multifactorial.  He could be dehydrated/weak tripped on something.  Patient does not remember the exact events before the fall. Physical therapy recommended SNF.  He does not have any injuries or ulcers after the fall.  Elevated lactic acid: Present on admission.   Sepsis is less likely.  Lactic acidosis resolved with IV fluids.  Chronic pain : Complains of pain on his left shoulder on and off.  His shoulder is currently not inflamed or tender.  Positive blood culture: 1 of the blood cultures is growing coagulase negative staph .  This is most likely a contamination.  Patient is afebrile.  We will continue to follow the final report.  Elevated troponin: Most likely secondary to supply demand ischemia.  Also noted to have elevated CK level most likely secondary to fall which could have contributed.  Does not complain of any chest pain.  Will not pursue for any intervention for this.  Troponin has not trended up.  Echocardiogram showed ejection function of 60 to 55%, no wall motion abnormality.  Hyperlipidemia: Continue statin  History of hypertension: Currently normotensive.  Continue home antihypertensives.  Hypothyroidism: Continue Synthyroid.  Gross hematuria: Urine was noted to be slighltly bloody.  Urinalysis suggest RBCs.   CT of abdomen and pelvis showed bilateral nonobstructing nephrolithiasis.  Enlarged prostate.  Started on tamsulosin.  Follow-up with urology as an outpatient.  Ascending aortic aneurysm/infrarenal aortic aneurysm: Moderate to severe ascending aorta aneurysm measuring 4.9cm.Follow-up with ECHO as an outpatient .Also found to have infrarenal aortic aneurysm measuring 3.4 cm diameter. Recommend Follow up by ultrasound in 3 years.   DVT prophylaxis: SCD Code Status: Full Family Communication: None present at the bedside Disposition Plan: Skilled nursing facility as soon as the bed is available  Consultants: None  Procedures: None  Antimicrobials: None  Subjective: Patient seen and examined the bedside this morning.  Remains comfortable.  No new issues/events. Objective: Vitals:   09/18/17 2334 09/19/17 0500 09/19/17 0824 09/19/17 1118  BP: 124/72 129/80 (!) 149/91 128/81  Pulse: 75 70 77 84  Resp: 20 18 18 18   Temp:  98.2 F (36.8 C) 97.7 F (36.5 C) 97.7 F (36.5 C) 97.6 F (36.4 C)  TempSrc: Oral Oral Oral Oral  SpO2: 94% 92% 97% 93%  Weight: 88 kg (194 lb 0.1 oz)     Height:        Intake/Output Summary (Last 24 hours) at 09/19/2017 1330 Last data filed at 09/19/2017 1300 Gross per 24 hour  Intake 1689.73 ml  Output 1600 ml  Net 89.73 ml   Filed Weights   09/17/17 1044 09/18/17 2334  Weight: 86.2 kg (190 lb) 88 kg (194 lb 0.1 oz)    Examination:  General exam: Appears calm and comfortable ,Not in distress,elderly male HEENT:PERRL,Oral mucosa moist, Ear/Nose normal on gross exam Respiratory system: Bilateral equal air entry, normal vesicular breath sounds, no wheezes or crackles  Cardiovascular system: S1 & S2 heard, RRR. No JVD, murmurs, rubs, gallops or clicks. Gastrointestinal system: Abdomen is nondistended, soft and nontender. No organomegaly or masses felt. Normal bowel sounds heard. Central nervous system: Alert and oriented. No focal neurological deficits. Extremities: No edema, no clubbing ,no cyanosis, distal peripheral pulses palpable. Skin: No rashes, lesions or ulcers,no icterus ,no pallor MSK: Normal muscle bulk,tone ,power Psychiatry: Judgement and insight appear normal. Mood & affect appropriate.     Data Reviewed: I have personally reviewed following labs and imaging studies  CBC: Recent Labs  Lab 09/17/17 1059 09/18/17 0407  WBC 9.6 8.5  HGB 15.1 13.3  HCT 45.9 40.4  MCV 99.4 100.7*  PLT 221 161   Basic Metabolic Panel: Recent Labs  Lab 09/17/17 1059 09/18/17 0407  NA 147* 144  K 3.7 3.9  CL 111 111  CO2 25 27  GLUCOSE 132* 87  BUN 22 21  CREATININE 1.05 0.92  CALCIUM 9.5 8.7*   GFR: Estimated Creatinine Clearance: 58.6 mL/min (by C-G formula based on SCr of 0.92 mg/dL). Liver Function Tests: Recent Labs  Lab 09/17/17 1059  AST 48*  ALT 19  ALKPHOS 62  BILITOT 1.8*  PROT 6.9  ALBUMIN 3.8   No results for input(s): LIPASE, AMYLASE in  the last 168 hours. Recent Labs  Lab 09/17/17 1706  AMMONIA 16   Coagulation Profile: No results for input(s): INR, PROTIME in the last 168 hours. Cardiac Enzymes: Recent Labs  Lab 09/17/17 1706 09/17/17 1847 09/18/17 0836 09/18/17 1443 09/18/17 2044 09/19/17 0448  CKTOTAL 1,159*  --   --   --   --  753*  CKMB 11.0*  --   --   --   --   --   TROPONINI  --  1.03* 1.07* 0.99* 0.79*  --    BNP (last 3 results) No results for input(s): PROBNP in the last 8760 hours. HbA1C: No results for input(s): HGBA1C in the last 72 hours. CBG: Recent Labs  Lab 09/17/17 1133  GLUCAP 131*   Lipid Profile: No results for input(s): CHOL, HDL, LDLCALC, TRIG, CHOLHDL, LDLDIRECT in the last 72 hours. Thyroid Function Tests: Recent Labs    09/17/17 1706  TSH 1.616  FREET4 0.97   Anemia Panel: Recent Labs    09/17/17 1706  VITAMINB12 1,015*   Sepsis Labs: Recent Labs  Lab 09/17/17 1431 09/17/17 1706 09/17/17 1847 09/18/17 0407 09/19/17 0448  PROCALCITON  --   --  <  0.10 <0.10 <0.10  LATICACIDVEN 2.76* 1.6  --   --   --     Recent Results (from the past 240 hour(s))  Culture, blood (Routine X 2) w Reflex to ID Panel     Status: Abnormal (Preliminary result)   Collection Time: 09/17/17  5:06 PM  Result Value Ref Range Status   Specimen Description BLOOD LEFT ARM  Final   Special Requests   Final    BOTTLES DRAWN AEROBIC AND ANAEROBIC Blood Culture adequate volume   Culture  Setup Time   Final    GRAM POSITIVE COCCI IN BOTH AEROBIC AND ANAEROBIC BOTTLES CRITICAL RESULT CALLED TO, READ BACK BY AND VERIFIED WITH: Jamse Mead PharmD 10:15 09/18/17 ([email or web address]) Performed at [facility], [place] [address] [place], [place] [address]    Culture STAPHYLOCOCCUS SPECIES (COAGULASE NEGATIVE) (A)  Final   Report Status PENDING  Incomplete  Culture, blood (Routine X 2) w Reflex to ID Panel     Status: None (Preliminary result)   Collection Time: 09/17/17  5:06 PM  Result Value Ref Range  Status   Specimen Description BLOOD RIGHT ANTECUBITAL  Final   Special Requests   Final    BOTTLES DRAWN AEROBIC AND ANAEROBIC Blood Culture adequate volume   Culture   Final    NO GROWTH < 24 HOURS Performed at [facility], [place] [address] [place], [place] [address]    Report Status PENDING  Incomplete  Blood Culture ID Panel (Reflexed)     Status: Abnormal   Collection Time: 09/17/17  5:06 PM  Result Value Ref Range Status   Enterococcus species NOT DETECTED NOT DETECTED Final   Listeria monocytogenes NOT DETECTED NOT DETECTED Final   Staphylococcus species DETECTED (A) NOT DETECTED Final    Comment: Methicillin (oxacillin) resistant coagulase negative staphylococcus. Possible blood culture contaminant (unless isolated from more than one blood culture draw or clinical case suggests pathogenicity). No antibiotic treatment is indicated for blood  culture contaminants. CRITICAL RESULT CALLED TO, READ BACK BY AND VERIFIED WITH: E. Deja PharmD 10:15 09/18/17 ([email or web address])    Staphylococcus aureus NOT DETECTED NOT DETECTED Final   Methicillin resistance DETECTED (A) NOT DETECTED Final    Comment: CRITICAL RESULT CALLED TO, READ BACK BY AND VERIFIED WITH: E. Deja PharmD 10:15 09/18/17 ([email or web address])    Streptococcus species NOT DETECTED NOT DETECTED Final   Streptococcus agalactiae NOT DETECTED NOT DETECTED Final   Streptococcus pneumoniae NOT DETECTED NOT DETECTED Final   Streptococcus pyogenes NOT DETECTED NOT DETECTED Final   Acinetobacter baumannii NOT DETECTED NOT DETECTED Final   Enterobacteriaceae species NOT DETECTED NOT DETECTED Final   Enterobacter cloacae complex NOT DETECTED NOT DETECTED Final   Escherichia coli NOT DETECTED NOT DETECTED Final   Klebsiella oxytoca NOT DETECTED NOT DETECTED Final   Klebsiella pneumoniae NOT DETECTED NOT DETECTED Final   Proteus species NOT DETECTED NOT DETECTED Final   Serratia marcescens NOT DETECTED NOT DETECTED Final   Haemophilus influenzae  NOT DETECTED NOT DETECTED Final   Neisseria meningitidis NOT DETECTED NOT DETECTED Final   Pseudomonas aeruginosa NOT DETECTED NOT DETECTED Final   Candida albicans NOT DETECTED NOT DETECTED Final   Candida glabrata NOT DETECTED NOT DETECTED Final   Candida krusei NOT DETECTED NOT DETECTED Final   Candida parapsilosis NOT DETECTED NOT DETECTED Final   Candida tropicalis NOT DETECTED NOT DETECTED Final         Radiology Studies: Ct Abdomen Pelvis Wo Contrast  Result Date: 09/19/2017  CLINICAL DATA:  Hematuria.  Acute confusion. EXAM: CT ABDOMEN AND PELVIS WITHOUT CONTRAST TECHNIQUE: Multidetector CT imaging of the abdomen and pelvis was performed following the standard protocol without IV contrast. COMPARISON:  11/01/2010 FINDINGS: Lower chest: Atelectasis in the left lung base. Mild cardiac enlargement. Coronary artery calcifications. Hepatobiliary: No focal liver abnormality is seen. Status post cholecystectomy. No biliary dilatation. Pancreas: Unremarkable. No pancreatic ductal dilatation or surrounding inflammatory changes. Spleen: Normal in size without focal abnormality. Adrenals/Urinary Tract: No adrenal gland nodules. Bilateral intrarenal stones with a 4.5 mm stone in the right kidney and a 3 mm stone in the left kidney. No ureteral stones are demonstrated. No hydronephrosis or hydroureter. Bladder wall is not thickened. 6 mm calcification on the posterior wall of the bladder. This was present previously and likely represents mural calcification rather than a stone. Stomach/Bowel: Stomach, small bowel, and colon are not abnormally distended. No wall thickening or inflammatory changes are appreciated. Diverticulosis of the sigmoid colon. Appendix is normal. Vascular/Lymphatic: Calcification of the aorta. Infrarenal aortic aneurysm measuring 3.4 cm diameter. Bilateral iliac artery aneurysms measuring 2 cm on the right and 1.6 cm on the left. No significant lymphadenopathy. Reproductive: Prostate  gland is markedly enlarged, measuring 7 cm diameter. Other: No free air or free fluid in the abdomen. Scarring in the anterior abdominal wall consistent with postoperative change. Sternotomy wires. Fat in the inguinal canals bilaterally. Musculoskeletal: Degenerative changes throughout the lumbar spine. Spondylolysis with mild spondylolisthesis at L5-S1. Degenerative changes in the hips. IMPRESSION: 1. Bilateral nonobstructing intrarenal stones. No ureteral stone or obstruction. 6 mm calcification on the posterior wall of the bladder is unchanged since previous study, likely mural calcification rather than a stone. 2. Marked enlargement of the prostate gland. 3. Infrarenal aortic aneurysm measuring 3.4 cm diameter. Recommend followup by ultrasound in 3 years. This recommendation follows ACR consensus guidelines: White Paper of the ACR Incidental Findings Committee II on Vascular Findings. J Am Coll Radiol 2013; 10:789-794. 4. Bilateral iliac artery aneurysms measuring 2 cm on the right and 1.6 cm on the left. Electronically Signed   By: Lucienne Capers M.D.   On: 09/19/2017 04:58        Scheduled Meds: . aspirin  325 mg Oral Daily  . levothyroxine  125 mcg Oral QAC breakfast  . metoprolol succinate  12.5 mg Oral Daily  . pantoprazole  40 mg Oral Daily  . tamsulosin  0.4 mg Oral Daily  . cyanocobalamin  1,000 mcg Oral Daily   Continuous Infusions:    LOS: 2 days    Time spent: 35 mins.More than 50% of that time was spent in counseling and/or coordination of care.      Shelly Coss, MD [facility] Pager [phone]  If 7PM-7AM, please contact night-coverage [email or web address] Password [email or web address] 09/19/2017, 1:30 PM

## 2017-09-19 NOTE — [place] FL2 (Signed)
[place] MEDICAID FL2 LEVEL OF CARE SCREENING TOOL     IDENTIFICATION  Patient Name: Barry Molina Birthdate: [birth year] Sex: male Admission Date (Current Location): 09/17/2017  [facility] and [place] Number:  Herbalist and Address:  [facility] [place] [address], [place], [place] [address]      Provider Number: [id number]  Attending Physician Name and Address:  Shelly Coss, MD  Relative Name and Phone Number:       Current Level of Care: Hospital Recommended Level of Care: [place] Prior Approval Number:    Date Approved/Denied:   PASRR Number: [id number]  Discharge Plan: SNF    Current Diagnoses: Patient Active Problem List   Diagnosis Date Noted  . Confusion 09/17/2017  . Acute hypernatremia   . Patella fracture 04/02/2016  . Elevated troponin 04/02/2016  . Arthritis of right knee 01/05/2016  . Acute confusional state 07/12/2015  . Headache 07/12/2015  . Post-concussion syndrome 07/12/2015  . Fracture of cervical vertebra, C5 ([place]) 07/12/2015  . Constipation 06/01/2015  . Acute encephalopathy 04/06/2015  . Well adult exam 03/31/2015  . Left rotator cuff tear arthropathy 04/23/2014  . Trigger index finger of right hand 01/07/2014  . Contusion of face 12/06/2012  . Sinusitis 12/06/2012  . Right knee pain 11/13/2012  . Ataxia 08/08/2012  . Neuropathy of both feet 08/08/2012  . Fall at home 10/23/2011  . Weight loss, abnormal 11/16/2010  . LEG PAIN 05/10/2010  . ELBOW PAIN 10/06/2009  . Malignant melanoma of skin of upper limb, including shoulder ([place]) 09/07/2008  . Edema 08/27/2008  . Personal history of other diseases of digestive system 08/26/2008  . CORONARY ARTERY BYPASS GRAFT, HX OF 08/26/2008  . NEOPLASM, SKIN, UNCERTAIN BEHAVIOR 29/93/7169  . B12 deficiency 12/09/2007  . MUSCLE PAIN 04/10/2007  . SHOULDER PAIN 12/19/2006  . Hypothyroidism 12/14/2006  . Dyslipidemia 12/14/2006  . Essential  hypertension 12/14/2006  . Coronary atherosclerosis 12/14/2006  . GERD 12/14/2006  . COLONIC POLYPS, HX OF 12/14/2006    Orientation RESPIRATION BLADDER Height & Weight     Self, Time, Place  Normal Continent Weight: 194 lb 0.1 oz (88 kg) Height:  6' (182.9 cm)  BEHAVIORAL SYMPTOMS/MOOD NEUROLOGICAL BOWEL NUTRITION STATUS      Continent Diet(heart healthy)  AMBULATORY STATUS COMMUNICATION OF NEEDS Skin   Limited Assist Verbally Normal                       Personal Care Assistance Level of Assistance  Bathing, Feeding, Dressing Bathing Assistance: Limited assistance Feeding assistance: Independent Dressing Assistance: Limited assistance     Functional Limitations Info  Sight, Hearing, Speech Sight Info: Adequate Hearing Info: Impaired(slightly hard of hearing) Speech Info: Adequate    SPECIAL CARE FACTORS FREQUENCY  PT (By licensed PT), OT (By licensed OT)     PT Frequency: 5x/wk OT Frequency: 5x/wk            Contractures Contractures Info: Not present    Additional Factors Info  Code Status, Allergies Code Status Info: Full Allergies Info: Ezetimibe-simvastatin, Morphine           Current Medications (09/19/2017):  This is the current hospital active medication list Current Facility-Administered Medications  Medication Dose Route Frequency Provider Last Rate Last Dose  . aspirin EC tablet 325 mg  325 mg Oral Daily Rondel Jumbo, PA-C   325 mg at 09/19/17 6789  . bisacodyl (DULCOLAX) suppository 10 mg  10  mg Rectal Daily PRN Rondel Jumbo, PA-C      . fluticasone (FLONASE) 50 MCG/ACT nasal spray 1 spray  1 spray Each Nare Daily PRN Rondel Jumbo, PA-C      . HYDROcodone-acetaminophen (NORCO/VICODIN) 5-325 MG per tablet 1-2 tablet  1-2 tablet Oral Q4H PRN Rondel Jumbo, PA-C   1 tablet at 09/18/17 1038  . levothyroxine (SYNTHROID, LEVOTHROID) tablet 125 mcg  125 mcg Oral QAC breakfast Rondel Jumbo, PA-C   125 mcg at 09/19/17 1165  .  metoprolol succinate (TOPROL-XL) 24 hr tablet 12.5 mg  12.5 mg Oral Daily Rondel Jumbo, PA-C   12.5 mg at 09/19/17 7903  . ondansetron (ZOFRAN) tablet 4 mg  4 mg Oral Q6H PRN Rondel Jumbo, PA-C       Or  . ondansetron (ZOFRAN) injection 4 mg  4 mg Intravenous Q6H PRN Rondel Jumbo, PA-C      . pantoprazole (PROTONIX) EC tablet 40 mg  40 mg Oral Daily Rondel Jumbo, PA-C   40 mg at 09/19/17 0917  . senna-docusate (Senokot-S) tablet 1 tablet  1 tablet Oral QHS PRN Rondel Jumbo, PA-C      . tamsulosin (FLOMAX) capsule 0.4 mg  0.4 mg Oral Daily Shelly Coss, MD   0.4 mg at 09/19/17 0917  . vitamin B-12 (CYANOCOBALAMIN) tablet 1,000 mcg  1,000 mcg Oral Daily Rondel Jumbo, PA-C   1,000 mcg at 09/19/17 8333     Discharge Medications: Please see discharge summary for a list of discharge medications.  Relevant Imaging Results:  Relevant Lab Results:   Additional Information SS#: [id number]  Geralynn Ochs, LCSW

## 2017-09-20 DIAGNOSIS — G9340 Encephalopathy, unspecified: Secondary | ICD-10-CM | POA: Diagnosis not present

## 2017-09-20 LAB — CULTURE, BLOOD (ROUTINE X 2): Special Requests: ADEQUATE

## 2017-09-20 MED ORDER — TAMSULOSIN HCL 0.4 MG PO CAPS: 0.4000 mg | ORAL_CAPSULE | Freq: Every day | ORAL | 0 refills | Status: AC

## 2017-09-20 MED ORDER — QUETIAPINE FUMARATE 25 MG PO TABS: 25.0000 mg | ORAL_TABLET | Freq: Every day | ORAL | 0 refills | Status: AC

## 2017-09-20 MED ORDER — TRAMADOL HCL 50 MG PO TABS: 50.0000 mg | ORAL_TABLET | Freq: Four times a day (QID) | ORAL | 0 refills | Status: AC | PRN

## 2017-09-20 NOTE — Discharge Summary (Signed)
Physician Discharge Summary  Barry Molina:[id number] DOB: [birth year] DOA: 09/17/2017  PCP: Cassandria Anger, MD  Admit date: 09/17/2017 Discharge date: 09/20/2017  Admitted From: Home Disposition:  SNF Discharge Condition:Stable CODE STATUS:FULL Diet recommendation: Heart Healthy   Brief/Interim Summary: Patient is a 84 year old male with past medical history of hypertension, hyperlipidemia, coronary artery disease, status post CABG who was brought to the emergency department from home after he was found on the floor.  Patient lives by himself.  Found to be hemodynamically stable but confused on presentation .Admitted for further work-up. Currently his overall status is stable.  Mental status has improved.He was evaluated by PT and recommended nursing facility bed.   Patient is stable for discharge to skilled nursing facility today.  Following problems were addressed during his hospitalization:    Acute encephalopathy: Currently  his mental status has improved and is at baseline this morning.  Alert and oriented.  Probably was dehydrated on presentation.  Might have mild component of dementia also.  CT head did not show any acute intracranial abnormalities.  No seizures were noted.  Patient was afebrile on presentation.  UDS not impressive.  TSH, vitamin B12 level normal.  Dehydration/hypernatremia: Initially given 2 L of bolus of normal saline on presentation.  Later on restarted on half-normal insulin for hyponatremia.  He looked dehydrated on presentation.  Currently hemodynamically stable. IV fluids D/ced  Fall at home: Lives by himself.  Ambulance with the help of walker.  Fall could be multifactorial.  He could be dehydrated/weak tripped on something.  Patient does not remember the exact events before the fall. Physical therapy recommended SNF.  He does not have any injuries or ulcers after the fall.  Elevated lactic acid: Present on admission.  Sepsis is less likely.   Lactic acidosis resolved with IV fluids.  Chronic pain : Complains of pain on his left shoulder on and off.  His shoulder is currently not inflamed or tender.  Positive blood culture: 1 of the blood cultures grew coagulase negative staph .  This is most likely a contamination.    Clinically there are no signs of infection.  No antibiotics indicated.  Elevated troponin: Most likely secondary to supply demand ischemia.  Also noted to have elevated CK level most likely secondary to fall which could have contributed.  Does not complain of any chest pain.  Will not pursue for any intervention for this.  Troponin has not trended up.  Echocardiogram showed ejection function of 60 to 55%, no wall motion abnormality.  Hyperlipidemia: Continue statin  History of hypertension: Currently normotensive.  Continue home antihypertensives.  Hypothyroidism: Continue Synthyroid.  Gross hematuria: Urine was noted to be slighltly bloody.  Urinalysis suggest RBCs.   CT of abdomen and pelvis showed bilateral nonobstructing nephrolithiasis.  Enlarged prostate.  Started on tamsulosin.  Follow-up with urology as an outpatient.  Ascending aortic aneurysm/infrarenal aortic aneurysm: Moderate to severe ascending aorta aneurysm measuring 4.9cm.Follow-up with ECHO as an outpatient .Also found to have infrarenal aortic aneurysm measuring 3.4 cm diameter. Recommend Follow up by ultrasound in 3 years.     Discharge Diagnoses:  Principal Problem:   Acute encephalopathy Active Problems:   Malignant melanoma of skin of upper limb, including shoulder (HCC)   Hypothyroidism   B12 deficiency   Dyslipidemia   Essential hypertension   Coronary atherosclerosis   GERD   Fall at home   Neuropathy of both feet   Elevated troponin   Confusion   Acute hypernatremia  Discharge Instructions  Discharge Instructions    Diet - low sodium heart healthy   Complete by:  As directed    Discharge instructions    Complete by:  As directed    1) Do a CBC and BMP tests in a week. 2) Follow up with urology as an outpatient in 2 weeks.  Name and number of the provider group has been attached. 3) Take prescribed medications as instructed.   Increase activity slowly   Complete by:  As directed      Allergies as of 09/20/2017      Reactions   Ezetimibe-simvastatin Other (See Comments)    aches   Morphine Nausea And Vomiting      Medication List    TAKE these medications   acetaminophen 325 MG tablet Commonly known as:  TYLENOL Take 650 mg by mouth 4 (four) times daily. Take ever day per daughter   aspirin 325 MG EC tablet Take 325 mg by mouth daily.   cyanocobalamin 1000 MCG tablet Take 1,000 mcg by mouth daily.   fluticasone 50 MCG/ACT nasal spray Commonly known as:  FLONASE USE 1 SPRAY IN EACH NOSTRIL DAILY AS NEEDED What changed:  See the new instructions.   furosemide 20 MG tablet Commonly known as:  LASIX TAKE 1 TO 2 TABLETS BY MOUTH ONCE DAILY AS NEEDED FOR EDEMA What changed:  See the new instructions.   levothyroxine 125 MCG tablet Commonly known as:  SYNTHROID, LEVOTHROID TAKE 1 TABLET BY MOUTH ONCE DAILY   metoprolol succinate 25 MG 24 hr tablet Commonly known as:  TOPROL-XL TAKE 1/2 (ONE-HALF) TABLET BY MOUTH ONCE DAILY   omeprazole 40 MG capsule Commonly known as:  PRILOSEC TAKE 1 CAPSULE BY MOUTH ONCE DAILY   QUEtiapine 25 MG tablet Commonly known as:  SEROQUEL Take 1 tablet (25 mg total) by mouth at bedtime.   tamsulosin 0.4 MG Caps capsule Commonly known as:  FLOMAX Take 1 capsule (0.4 mg total) by mouth daily.   traMADol 50 MG tablet Commonly known as:  ULTRAM Take 1 tablet (50 mg total) by mouth every 6 (six) hours as needed. What changed:  See the new instructions.   Vitamin D (Ergocalciferol) 50000 units Caps capsule Commonly known as:  DRISDOL TAKE 1 CAPSULE BY MOUTH ONCE A WEEK      Follow-up Information    Plotnikov, Evie Lacks, MD. Schedule  an appointment as soon as possible for a visit in 1 week(s).   Specialty:  Internal Medicine Contact information: [place] [address] [phone]        [facility]. Schedule an appointment as soon as possible for a visit in 2 week(s).   Contact information: [place] [phone]         Allergies  Allergen Reactions  . Ezetimibe-Simvastatin Other (See Comments)     aches  . Morphine Nausea And Vomiting    Consultations:  Neurology   Procedures/Studies: Ct Abdomen Pelvis Wo Contrast  Result Date: 09/19/2017 CLINICAL DATA:  Hematuria.  Acute confusion. EXAM: CT ABDOMEN AND PELVIS WITHOUT CONTRAST TECHNIQUE: Multidetector CT imaging of the abdomen and pelvis was performed following the standard protocol without IV contrast. COMPARISON:  11/01/2010 FINDINGS: Lower chest: Atelectasis in the left lung base. Mild cardiac enlargement. Coronary artery calcifications. Hepatobiliary: No focal liver abnormality is seen. Status post cholecystectomy. No biliary dilatation. Pancreas: Unremarkable. No pancreatic ductal dilatation or surrounding inflammatory changes. Spleen: Normal in  size without focal abnormality. Adrenals/Urinary Tract: No adrenal gland nodules. Bilateral intrarenal stones with a 4.5 mm stone in the right kidney and a 3 mm stone in the left kidney. No ureteral stones are demonstrated. No hydronephrosis or hydroureter. Bladder wall is not thickened. 6 mm calcification on the posterior wall of the bladder. This was present previously and likely represents mural calcification rather than a stone. Stomach/Bowel: Stomach, small bowel, and colon are not abnormally distended. No wall thickening or inflammatory changes are appreciated. Diverticulosis of the sigmoid colon. Appendix is normal. Vascular/Lymphatic: Calcification of the aorta. Infrarenal aortic aneurysm measuring 3.4 cm diameter. Bilateral iliac  artery aneurysms measuring 2 cm on the right and 1.6 cm on the left. No significant lymphadenopathy. Reproductive: Prostate gland is markedly enlarged, measuring 7 cm diameter. Other: No free air or free fluid in the abdomen. Scarring in the anterior abdominal wall consistent with postoperative change. Sternotomy wires. Fat in the inguinal canals bilaterally. Musculoskeletal: Degenerative changes throughout the lumbar spine. Spondylolysis with mild spondylolisthesis at L5-S1. Degenerative changes in the hips. IMPRESSION: 1. Bilateral nonobstructing intrarenal stones. No ureteral stone or obstruction. 6 mm calcification on the posterior wall of the bladder is unchanged since previous study, likely mural calcification rather than a stone. 2. Marked enlargement of the prostate gland. 3. Infrarenal aortic aneurysm measuring 3.4 cm diameter. Recommend followup by ultrasound in 3 years. This recommendation follows ACR consensus guidelines: White Paper of the ACR Incidental Findings Committee II on Vascular Findings. J Am Coll Radiol 2013; 10:789-794. 4. Bilateral iliac artery aneurysms measuring 2 cm on the right and 1.6 cm on the left. Electronically Signed   By: Lucienne Capers M.D.   On: 09/19/2017 04:58   Dg Chest 2 View  Result Date: 09/17/2017 CLINICAL DATA:  Syncope EXAM: CHEST - 2 VIEW COMPARISON:  04/02/2016 and prior exams FINDINGS: Cardiomegaly and CABG changes again noted. Elevation of the LEFT hemidiaphragm is unchanged. There is no evidence of focal airspace disease, pulmonary edema, suspicious pulmonary nodule/mass, pleural effusion, or pneumothorax. No acute bony abnormalities are identified. IMPRESSION: Cardiomegaly without evidence of acute cardiopulmonary disease. Electronically Signed   By: Margarette Canada M.D.   On: 09/17/2017 12:12   Ct Head Wo Contrast  Result Date: 09/17/2017 CLINICAL DATA:  84 year old male found on floor. Patient does not remember what happened. Denies pain. Initial  encounter. EXAM: CT HEAD WITHOUT CONTRAST CT CERVICAL SPINE WITHOUT CONTRAST TECHNIQUE: Multidetector CT imaging of the head and cervical spine was performed following the standard protocol without intravenous contrast. Multiplanar CT image reconstructions of the cervical spine were also generated. COMPARISON:  07/12/2015 CT. FINDINGS: CT HEAD FINDINGS Brain: No intracranial hemorrhage or CT evidence of large acute infarct. Chronic microvascular changes. Global atrophy. No intracranial mass lesion noted on this unenhanced exam. Vascular: Vascular calcifications Skull: No skull fracture Sinuses/Orbits: No acute orbital abnormality. Moderate mucosal thickening right maxillary sinus. Minimal mucosal thickening ethmoid sinus air cells. Partial opacification posterior right sphenoid sinus. Other: Mastoid air cells and middle ear cavities are clear. CT CERVICAL SPINE FINDINGS Alignment: Minimal anterior slip C4 and C7 similar to prior exam. Skull base and vertebrae: No acute cervical spine fracture. Soft tissues and spinal canal: No abnormal prevertebral soft tissue swelling. Disc levels: Multilevel cervical spondylotic changes most prominent C5-6 and C6-7. Transverse ligament hypertrophy with partial calcification. Upper chest: Scarring lung apices greater on right. Other: Aortic calcification and carotid artery calcification. IMPRESSION: No skull fracture or intracranial hemorrhage. Chronic microvascular changes without CT evidence of  large acute infarct. Atrophy. No acute cervical spine fracture. Alignment similar to prior exam. No abnormal prevertebral soft tissue swelling. Multifactorial cervical spondylotic changes most notable C5-6 and C6-7. Paranasal sinus mucosal thickening/opacification most notable involving the right maxillary sinus. Aortic Atherosclerosis (ICD10-I70.0). Electronically Signed   By: Genia Del M.D.   On: 09/17/2017 12:48   Ct Cervical Spine Wo Contrast  Result Date: 09/17/2017 CLINICAL  DATA:  84 year old male found on floor. Patient does not remember what happened. Denies pain. Initial encounter. EXAM: CT HEAD WITHOUT CONTRAST CT CERVICAL SPINE WITHOUT CONTRAST TECHNIQUE: Multidetector CT imaging of the head and cervical spine was performed following the standard protocol without intravenous contrast. Multiplanar CT image reconstructions of the cervical spine were also generated. COMPARISON:  07/12/2015 CT. FINDINGS: CT HEAD FINDINGS Brain: No intracranial hemorrhage or CT evidence of large acute infarct. Chronic microvascular changes. Global atrophy. No intracranial mass lesion noted on this unenhanced exam. Vascular: Vascular calcifications Skull: No skull fracture Sinuses/Orbits: No acute orbital abnormality. Moderate mucosal thickening right maxillary sinus. Minimal mucosal thickening ethmoid sinus air cells. Partial opacification posterior right sphenoid sinus. Other: Mastoid air cells and middle ear cavities are clear. CT CERVICAL SPINE FINDINGS Alignment: Minimal anterior slip C4 and C7 similar to prior exam. Skull base and vertebrae: No acute cervical spine fracture. Soft tissues and spinal canal: No abnormal prevertebral soft tissue swelling. Disc levels: Multilevel cervical spondylotic changes most prominent C5-6 and C6-7. Transverse ligament hypertrophy with partial calcification. Upper chest: Scarring lung apices greater on right. Other: Aortic calcification and carotid artery calcification. IMPRESSION: No skull fracture or intracranial hemorrhage. Chronic microvascular changes without CT evidence of large acute infarct. Atrophy. No acute cervical spine fracture. Alignment similar to prior exam. No abnormal prevertebral soft tissue swelling. Multifactorial cervical spondylotic changes most notable C5-6 and C6-7. Paranasal sinus mucosal thickening/opacification most notable involving the right maxillary sinus. Aortic Atherosclerosis (ICD10-I70.0). Electronically Signed   By: Genia Del M.D.   On: 09/17/2017 12:48       Subjective: Patient seen and examined the bedside this morning.  Remains comfortable.  Alert and awake.  Hemodynamically stable.  Stable for discharge to skilled nursing facility today.  Discharge Exam: Vitals:   09/20/17 0500 09/20/17 0900  BP:  114/68  Pulse:  85  Resp:  18  Temp: 98.3 F (36.8 C) 98.5 F (36.9 C)  SpO2:  93%   Vitals:   09/20/17 0229 09/20/17 0428 09/20/17 0500 09/20/17 0900  BP:  112/62  114/68  Pulse:  68  85  Resp:  13  18  Temp:   98.3 F (36.8 C) 98.5 F (36.9 C)  TempSrc:   Oral Oral  SpO2:  92%  93%  Weight: 93 kg (205 lb)     Height:        General: Pt is alert, awake, not in acute distress Cardiovascular: RRR, S1/S2 +, no rubs, no gallops Respiratory: CTA bilaterally, no wheezing, no rhonchi Abdominal: Soft, NT, ND, bowel sounds + Extremities: no edema, no cyanosis    The results of significant diagnostics from this hospitalization (including imaging, microbiology, ancillary and laboratory) are listed below for reference.     Microbiology: Recent Results (from the past 240 hour(s))  Culture, blood (Routine X 2) w Reflex to ID Panel     Status: Abnormal   Collection Time: 09/17/17  5:06 PM  Result Value Ref Range Status   Specimen Description BLOOD LEFT ARM  Final   Special Requests   Final  BOTTLES DRAWN AEROBIC AND ANAEROBIC Blood Culture adequate volume   Culture  Setup Time   Final    GRAM POSITIVE COCCI IN BOTH AEROBIC AND ANAEROBIC BOTTLES CRITICAL RESULT CALLED TO, READ BACK BY AND VERIFIED WITH: E. Deja PharmD 10:15 09/18/17 ([email or web address])    Culture (A)  Final    STAPHYLOCOCCUS SPECIES (COAGULASE NEGATIVE) THE SIGNIFICANCE OF ISOLATING THIS ORGANISM FROM A SINGLE SET OF BLOOD CULTURES WHEN MULTIPLE SETS ARE DRAWN IS UNCERTAIN. PLEASE NOTIFY THE MICROBIOLOGY DEPARTMENT WITHIN ONE WEEK IF SPECIATION AND SENSITIVITIES ARE REQUIRED. Performed at [facility], [place] [address]  [place], [place] [address]    Report Status 09/20/2017 FINAL  Final  Culture, blood (Routine X 2) w Reflex to ID Panel     Status: None (Preliminary result)   Collection Time: 09/17/17  5:06 PM  Result Value Ref Range Status   Specimen Description BLOOD RIGHT ANTECUBITAL  Final   Special Requests   Final    BOTTLES DRAWN AEROBIC AND ANAEROBIC Blood Culture adequate volume   Culture   Final    NO GROWTH 2 DAYS Performed at [facility], [place] [address], [place], [place] [address]    Report Status PENDING  Incomplete  Blood Culture ID Panel (Reflexed)     Status: Abnormal   Collection Time: 09/17/17  5:06 PM  Result Value Ref Range Status   Enterococcus species NOT DETECTED NOT DETECTED Final   Listeria monocytogenes NOT DETECTED NOT DETECTED Final   Staphylococcus species DETECTED (A) NOT DETECTED Final    Comment: Methicillin (oxacillin) resistant coagulase negative staphylococcus. Possible blood culture contaminant (unless isolated from more than one blood culture draw or clinical case suggests pathogenicity). No antibiotic treatment is indicated for blood  culture contaminants. CRITICAL RESULT CALLED TO, READ BACK BY AND VERIFIED WITH: E. Deja PharmD 10:15 09/18/17 ([email or web address])    Staphylococcus aureus NOT DETECTED NOT DETECTED Final   Methicillin resistance DETECTED (A) NOT DETECTED Final    Comment: CRITICAL RESULT CALLED TO, READ BACK BY AND VERIFIED WITH: E. Deja PharmD 10:15 09/18/17 ([email or web address])    Streptococcus species NOT DETECTED NOT DETECTED Final   Streptococcus agalactiae NOT DETECTED NOT DETECTED Final   Streptococcus pneumoniae NOT DETECTED NOT DETECTED Final   Streptococcus pyogenes NOT DETECTED NOT DETECTED Final   Acinetobacter baumannii NOT DETECTED NOT DETECTED Final   Enterobacteriaceae species NOT DETECTED NOT DETECTED Final   Enterobacter cloacae complex NOT DETECTED NOT DETECTED Final   Escherichia coli NOT DETECTED NOT DETECTED Final   Klebsiella oxytoca NOT  DETECTED NOT DETECTED Final   Klebsiella pneumoniae NOT DETECTED NOT DETECTED Final   Proteus species NOT DETECTED NOT DETECTED Final   Serratia marcescens NOT DETECTED NOT DETECTED Final   Haemophilus influenzae NOT DETECTED NOT DETECTED Final   Neisseria meningitidis NOT DETECTED NOT DETECTED Final   Pseudomonas aeruginosa NOT DETECTED NOT DETECTED Final   Candida albicans NOT DETECTED NOT DETECTED Final   Candida glabrata NOT DETECTED NOT DETECTED Final   Candida krusei NOT DETECTED NOT DETECTED Final   Candida parapsilosis NOT DETECTED NOT DETECTED Final   Candida tropicalis NOT DETECTED NOT DETECTED Final     Labs: BNP (last 3 results) No results for input(s): BNP in the last 8760 hours. Basic Metabolic Panel: Recent Labs  Lab 09/17/17 1059 09/18/17 0407  NA 147* 144  K 3.7 3.9  CL 111 111  CO2 25 27  GLUCOSE 132* 87  BUN 22 21  CREATININE 1.05 0.92  CALCIUM 9.5 8.7*   Liver Function Tests: Recent Labs  Lab 09/17/17 1059  AST 48*  ALT 19  ALKPHOS 62  BILITOT 1.8*  PROT 6.9  ALBUMIN 3.8   No results for input(s): LIPASE, AMYLASE in the last 168 hours. Recent Labs  Lab 09/17/17 1706  AMMONIA 16   CBC: Recent Labs  Lab 09/17/17 1059 09/18/17 0407  WBC 9.6 8.5  HGB 15.1 13.3  HCT 45.9 40.4  MCV 99.4 100.7*  PLT 221 194   Cardiac Enzymes: Recent Labs  Lab 09/17/17 1706 09/17/17 1847 09/18/17 0836 09/18/17 1443 09/18/17 2044 09/19/17 0448  CKTOTAL 1,159*  --   --   --   --  753*  CKMB 11.0*  --   --   --   --   --   TROPONINI  --  1.03* 1.07* 0.99* 0.79*  --    BNP: Invalid input(s): POCBNP CBG: Recent Labs  Lab 09/17/17 1133  GLUCAP 131*   D-Dimer No results for input(s): DDIMER in the last 72 hours. Hgb A1c No results for input(s): HGBA1C in the last 72 hours. Lipid Profile No results for input(s): CHOL, HDL, LDLCALC, TRIG, CHOLHDL, LDLDIRECT in the last 72 hours. Thyroid function studies Recent Labs    09/17/17 1706  TSH  1.616   Anemia work up Recent Labs    09/17/17 1706  VITAMINB12 1,015*   Urinalysis    Component Value Date/Time   COLORURINE YELLOW 09/17/2017 1340   APPEARANCEUR CLEAR 09/17/2017 1340   LABSPEC 1.021 09/17/2017 1340   PHURINE 7.0 09/17/2017 1340   GLUCOSEU NEGATIVE 09/17/2017 1340   GLUCOSEU NEGATIVE 07/12/2015 1200   HGBUR SMALL (A) 09/17/2017 1340   BILIRUBINUR NEGATIVE 09/17/2017 1340   KETONESUR 80 (A) 09/17/2017 1340   PROTEINUR 30 (A) 09/17/2017 1340   UROBILINOGEN 1.0 07/12/2015 1200   NITRITE NEGATIVE 09/17/2017 1340   LEUKOCYTESUR NEGATIVE 09/17/2017 1340   Sepsis Labs Invalid input(s): PROCALCITONIN,  WBC,  LACTICIDVEN Microbiology Recent Results (from the past 240 hour(s))  Culture, blood (Routine X 2) w Reflex to ID Panel     Status: Abnormal   Collection Time: 09/17/17  5:06 PM  Result Value Ref Range Status   Specimen Description BLOOD LEFT ARM  Final   Special Requests   Final    BOTTLES DRAWN AEROBIC AND ANAEROBIC Blood Culture adequate volume   Culture  Setup Time   Final    GRAM POSITIVE COCCI IN BOTH AEROBIC AND ANAEROBIC BOTTLES CRITICAL RESULT CALLED TO, READ BACK BY AND VERIFIED WITH: E. Deja PharmD 10:15 09/18/17 ([email or web address])    Culture (A)  Final    STAPHYLOCOCCUS SPECIES (COAGULASE NEGATIVE) THE SIGNIFICANCE OF ISOLATING THIS ORGANISM FROM A SINGLE SET OF BLOOD CULTURES WHEN MULTIPLE SETS ARE DRAWN IS UNCERTAIN. PLEASE NOTIFY THE MICROBIOLOGY DEPARTMENT WITHIN ONE WEEK IF SPECIATION AND SENSITIVITIES ARE REQUIRED. Performed at [facility], [place] [address], [place],  [address]    Report Status 09/20/2017 FINAL  Final  Culture, blood (Routine X 2) w Reflex to ID Panel     Status: None (Preliminary result)   Collection Time: 09/17/17  5:06 PM  Result Value Ref Range Status   Specimen Description BLOOD RIGHT ANTECUBITAL  Final   Special Requests   Final    BOTTLES DRAWN AEROBIC AND ANAEROBIC Blood Culture adequate volume   Culture    Final    NO GROWTH 2 DAYS Performed at [facility], [place] [address], [place], [place]  [address]    Report Status PENDING  Incomplete  Blood Culture ID Panel (Reflexed)     Status: Abnormal   Collection Time: 09/17/17  5:06 PM  Result Value Ref Range Status   Enterococcus species NOT DETECTED NOT DETECTED Final   Listeria monocytogenes NOT DETECTED NOT DETECTED Final   Staphylococcus species DETECTED (A) NOT DETECTED Final    Comment: Methicillin (oxacillin) resistant coagulase negative staphylococcus. Possible blood culture contaminant (unless isolated from more than one blood culture draw or clinical case suggests pathogenicity). No antibiotic treatment is indicated for blood  culture contaminants. CRITICAL RESULT CALLED TO, READ BACK BY AND VERIFIED WITH: E. Deja PharmD 10:15 09/18/17 ([email or web address])    Staphylococcus aureus NOT DETECTED NOT DETECTED Final   Methicillin resistance DETECTED (A) NOT DETECTED Final    Comment: CRITICAL RESULT CALLED TO, READ BACK BY AND VERIFIED WITH: E. Deja PharmD 10:15 09/18/17 ([email or web address])    Streptococcus species NOT DETECTED NOT DETECTED Final   Streptococcus agalactiae NOT DETECTED NOT DETECTED Final   Streptococcus pneumoniae NOT DETECTED NOT DETECTED Final   Streptococcus pyogenes NOT DETECTED NOT DETECTED Final   Acinetobacter baumannii NOT DETECTED NOT DETECTED Final   Enterobacteriaceae species NOT DETECTED NOT DETECTED Final   Enterobacter cloacae complex NOT DETECTED NOT DETECTED Final   Escherichia coli NOT DETECTED NOT DETECTED Final   Klebsiella oxytoca NOT DETECTED NOT DETECTED Final   Klebsiella pneumoniae NOT DETECTED NOT DETECTED Final   Proteus species NOT DETECTED NOT DETECTED Final   Serratia marcescens NOT DETECTED NOT DETECTED Final   Haemophilus influenzae NOT DETECTED NOT DETECTED Final   Neisseria meningitidis NOT DETECTED NOT DETECTED Final   Pseudomonas aeruginosa NOT DETECTED NOT DETECTED Final   Candida albicans NOT  DETECTED NOT DETECTED Final   Candida glabrata NOT DETECTED NOT DETECTED Final   Candida krusei NOT DETECTED NOT DETECTED Final   Candida parapsilosis NOT DETECTED NOT DETECTED Final   Candida tropicalis NOT DETECTED NOT DETECTED Final    Please note: You were cared for by a hospitalist during your hospital stay. Once you are discharged, your primary care physician will handle any further medical issues. Please note that NO REFILLS for any discharge medications will be authorized once you are discharged, as it is imperative that you return to your primary care physician (or establish a relationship with a primary care physician if you do not have one) for your post hospital discharge needs so that they can reassess your need for medications and monitor your lab values.    Time coordinating discharge: 40 minutes  SIGNED:   Shelly Coss, MD  [facility] 09/20/2017, 10:50 AM Pager [phone]  If 7PM-7AM, please contact night-coverage [email or web address] Password [email or web address]

## 2017-09-20 NOTE — Progress Notes (Signed)
Occupational Therapy Treatment Patient Details Name: Barry Molina MRN: [id number] DOB: [birth year] Today's Date: 09/20/2017    History of present illness 84 y.o. male with medical history significant for hypertension, hyperlipidemia, CAD status post CABG, brought to the hospital today after family noticing a change in behavior. Pt was found the morning of 7/22 on the floor. Pt was hemodynamically stable but confused on presentation to ED. Admitted for further work-up.   OT comments  Pt progressing towards OT goals, presents supine in bed pleasant and willing to work with therapy. Pt completing room level functional mobility this session with overall minA using RW, requiring cues during mobility for maintaining closer proximity to RW. Pt completing standing grooming ADLs with overall minA for standing balance. Pt fatigued with room level activity but is motivated to work and progress towards PLOF. Discharge plan remains appropriate; will continue to follow acutely.     Follow Up Recommendations  SNF;Supervision/Assistance - 24 hour    Equipment Recommendations  3 in 1 bedside commode;Other (comment)(to be further assessed )          Precautions / Restrictions Precautions Precautions: Fall Restrictions Weight Bearing Restrictions: No       Mobility Bed Mobility Overal bed mobility: Needs Assistance Bed Mobility: Supine to Sit     Supine to sit: Min assist     General bed mobility comments: MinA to support trunk upright into sitting   Transfers Overall transfer level: Needs assistance Equipment used: Rolling walker (2 wheeled) Transfers: Sit to/from Stand Sit to Stand: Min assist         General transfer comment: Min A to power up at bedside; VCs for hand placement and to control descent when transitioning into sitting     Balance Overall balance assessment: Needs assistance Sitting-balance support: Feet supported Sitting balance-Leahy Scale: Good     Standing  balance support: Bilateral upper extremity supported;During functional activity Standing balance-Leahy Scale: Poor Standing balance comment: reliant on external support; use of forearms and minA from therapist when standing at sink to perform grooming ADLs                           ADL either performed or assessed with clinical judgement   ADL Overall ADL's : Needs assistance/impaired Eating/Feeding: Set up;Sitting Eating/Feeding Details (indicate cue type and reason): to open containers  Grooming: Oral care;Wash/dry face;Minimal assistance;Standing;Set up;Sitting Grooming Details (indicate cue type and reason): minA for standing balance to perform oral care, pt using forearms for additional standing support; setup assist to wash face while seated in recliner                              Functional mobility during ADLs: Minimal assistance;Rolling walker General ADL Comments: pt completing room level ambulation using RW, standing grooming ADLs prior to transfer to recliner. Requires cues for maintaining close proximity to RW during ambulation; pt fatigued with room level activity      Vision       Perception     Praxis      Cognition Arousal/Alertness: Awake/alert Behavior During Therapy: WFL for tasks assessed/performed Overall Cognitive Status: Impaired/Different from baseline Area of Impairment: Following commands;Memory;Safety/judgement;Problem solving                   Current Attention Level: Selective   Following Commands: Follows one step commands consistently;Follows one step commands with increased time Safety/Judgement:  Decreased awareness of safety;Decreased awareness of deficits   Problem Solving: Slow processing;Requires verbal cues;Requires tactile cues General Comments: pt appears to have increased awareness this session, does require verbal safety cues during mobility         Exercises     Shoulder Instructions        General Comments pt's daughter present during session     Pertinent Vitals/ Pain       Pain Assessment: No/denies pain  Home Living                                          Prior Functioning/Environment              Frequency  Min 2X/week        Progress Toward Goals  OT Goals(current goals can now be found in the care plan section)  Progress towards OT goals: Progressing toward goals  Acute Rehab OT Goals Patient Stated Goal: return home OT Goal Formulation: With patient Time For Goal Achievement: 10/02/17 Potential to Achieve Goals: Good  Plan Discharge plan remains appropriate    Co-evaluation                 AM-PAC PT "6 Clicks" Daily Activity     Outcome Measure   Help from another person eating meals?: None Help from another person taking care of personal grooming?: A Little Help from another person toileting, which includes using toliet, bedpan, or urinal?: A Lot Help from another person bathing (including washing, rinsing, drying)?: A Lot Help from another person to put on and taking off regular upper body clothing?: None Help from another person to put on and taking off regular lower body clothing?: A Lot 6 Click Score: 17    End of Session Equipment Utilized During Treatment: Gait belt;Rolling walker  OT Visit Diagnosis: Unsteadiness on feet (R26.81);Muscle weakness (generalized) (M62.81);Other symptoms and signs involving cognitive function   Activity Tolerance Patient tolerated treatment well   Patient Left in chair;with call bell/phone within reach;with chair alarm set;with family/visitor present   Nurse Communication Mobility status        Time: 4010-2725 OT Time Calculation (min): 22 min  Charges: OT General Charges $OT Visit: 1 Visit OT Treatments $Self Care/Home Management : 8-22 mins  Barry Molina, OT Pager [phone] 09/20/2017   Barry Molina 09/20/2017, 2:11 PM

## 2017-09-20 NOTE — Care Management Note (Signed)
Case Management Note  Patient Details  Name: Barry Molina MRN: [id number] Date of Birth: September 16, 1925  Subjective/Objective:                    Action/Plan: Pt discharging to [facility]. CM signing off.   Expected Discharge Date:  09/20/17               Expected Discharge Plan:  Skilled Nursing Facility  In-House Referral:  Clinical Social Work  Discharge planning Services     Post Acute Care Choice:    Choice offered to:     DME Arranged:    DME Agency:     HH Arranged:    [place] Agency:     Status of Service:  Completed, signed off  If discussed at [employer], dates discussed:    Additional Comments:  Pollie Friar, RN 09/20/2017, 2:25 PM

## 2017-09-20 NOTE — Care Management Important Message (Signed)
Important Message  Patient Details  Name: Barry Molina MRN: [id number] Date of Birth: [birth year]   Medicare Important Message Given:  Yes    Sean Macwilliams Montine Circle 09/20/2017, 1:43 PM

## 2017-09-20 NOTE — Plan of Care (Signed)
Patient stable, discussed POC with patient and daughter, denies question/concerns at this time.

## 2017-09-20 NOTE — Clinical Social Work Placement (Signed)
Nurse to call report to [phone], Room 101A     CLINICAL SOCIAL WORK PLACEMENT  NOTE  Date:  09/20/2017  Patient Details  Name: Barry Molina MRN: [id number] Date of Birth: 10/06/1925  Clinical Social Work is seeking post-discharge placement for this patient at the [place] level of care (*CSW will initial, date and re-position this form in  chart as items are completed):  Yes   Patient/family provided with [facility] Work Department's list of facilities offering this level of care within the geographic area requested by the patient (or if unable, by the patient's family).  Yes   Patient/family informed of their freedom to choose among providers that offer the needed level of care, that participate in Medicare, Medicaid or managed care program needed by the patient, have an available bed and are willing to accept the patient.  Yes   Patient/family informed of [facility]'s ownership interest in [facility] and [facility], as well as of the fact that they are under no obligation to receive care at these facilities.  PASRR submitted to EDS on 09/19/17     PASRR number received on 09/19/17     Existing PASRR number confirmed on       FL2 transmitted to all facilities in geographic area requested by pt/family on 09/19/17     FL2 transmitted to all facilities within larger geographic area on       Patient informed that his/her managed care company has contracts with or will negotiate with certain facilities, including the following:        Yes   Patient/family informed of bed offers received.  Patient chooses bed at [facility], [place]     Physician recommends and patient chooses bed at      Patient to be transferred to [facility] on 09/20/17.  Patient to be transferred to facility by [facility]     Patient family notified on 09/20/17 of transfer.  Name of family member notified:  Pam     PHYSICIAN        Additional Comment:    _______________________________________________ Geralynn Ochs, LCSW 09/20/2017, 2:20 PM

## 2017-09-21 ENCOUNTER — Telehealth: Payer: Self-pay | Admitting: *Deleted

## 2017-09-21 NOTE — Telephone Encounter (Signed)
Pt was on TCM report admitted 09/17/17 further work-up for confusion. Mental status has improved.He was evaluated by PT and recommended nursing facility bed. Pt Was D/C 09/20/17, and sent to SNF. will need to f/u w/PCP 1 week after discharge from SNF,.Marland KitchenJohny Molina

## 2017-09-22 LAB — CULTURE, BLOOD (ROUTINE X 2)
CULTURE: NO GROWTH
Special Requests: ADEQUATE

## 2017-10-09 ENCOUNTER — Telehealth: Payer: Self-pay | Admitting: Internal Medicine

## 2017-10-09 NOTE — Telephone Encounter (Signed)
Copied from [place] [id number]. Topic: Quick Communication - See Telephone Encounter >> Oct 09, 2017  3:44 PM Neva Seat wrote: [place], [place]   [phone]   Verbal Order for Home Health PT: 2 times a week for 4 weeks

## 2017-10-10 NOTE — Telephone Encounter (Signed)
Ok thx.

## 2017-10-11 NOTE — Telephone Encounter (Signed)
Notified [name] w/MD response.Marland KitchenJohny Molina

## 2017-10-11 NOTE — Progress Notes (Signed)
  Entered in error,  Patient not seen today

## 2017-10-15 ENCOUNTER — Encounter: Payer: Medicare Other | Admitting: Family Medicine

## 2017-10-15 ENCOUNTER — Ambulatory Visit (INDEPENDENT_AMBULATORY_CARE_PROVIDER_SITE_OTHER): Payer: Medicare Other | Admitting: Internal Medicine

## 2017-10-15 ENCOUNTER — Encounter: Payer: Self-pay | Admitting: Internal Medicine

## 2017-10-15 ENCOUNTER — Ambulatory Visit: Payer: Self-pay

## 2017-10-15 DIAGNOSIS — M25512 Pain in left shoulder: Principal | ICD-10-CM

## 2017-10-15 DIAGNOSIS — J0140 Acute pansinusitis, unspecified: Secondary | ICD-10-CM

## 2017-10-15 DIAGNOSIS — G8929 Other chronic pain: Secondary | ICD-10-CM

## 2017-10-15 DIAGNOSIS — E034 Atrophy of thyroid (acquired): Secondary | ICD-10-CM | POA: Diagnosis not present

## 2017-10-15 DIAGNOSIS — R410 Disorientation, unspecified: Secondary | ICD-10-CM | POA: Diagnosis not present

## 2017-10-15 MED ORDER — DONEPEZIL HCL 5 MG PO TABS: 5.0000 mg | ORAL_TABLET | Freq: Every day | ORAL | 11 refills | Status: DC

## 2017-10-15 MED ORDER — CEFDINIR 300 MG PO CAPS: 300.0000 mg | ORAL_CAPSULE | Freq: Two times a day (BID) | ORAL | 0 refills | Status: DC

## 2017-10-15 NOTE — Assessment & Plan Note (Signed)
CT reviewed  Omnicef x 2 weeks

## 2017-10-15 NOTE — Progress Notes (Signed)
Subjective:  Patient ID: Barry Molina, male    DOB: June 02, 1925  Age: 84 y.o. MRN: [id number]  CC: No chief complaint on file.   HPI Barry Molina presents for confusion, ataxia, dementia f/u Hospital notes, tests revewed  Outpatient Medications Prior to Visit  Medication Sig Dispense Refill  . acetaminophen (TYLENOL) 325 MG tablet Take 650 mg by mouth 4 (four) times daily. Take ever day per daughter    . aspirin 325 MG EC tablet Take 325 mg by mouth daily.    . cyanocobalamin 1000 MCG tablet Take 1,000 mcg by mouth daily.    . fluticasone (FLONASE) 50 MCG/ACT nasal spray USE 1 SPRAY IN EACH NOSTRIL DAILY AS NEEDED (Patient taking differently: USE 1 SPRAY IN EACH NOSTRIL DAILY AS NEEDED FOR ALLERGIES) 16 g 1  . furosemide (LASIX) 20 MG tablet TAKE 1 TO 2 TABLETS BY MOUTH ONCE DAILY AS NEEDED FOR EDEMA (Patient taking differently: TAKE 20MG  BY MOUTH ONCE DAILY) 180 tablet 0  . levothyroxine (SYNTHROID, LEVOTHROID) 125 MCG tablet TAKE 1 TABLET BY MOUTH ONCE DAILY 90 tablet 3  . metoprolol succinate (TOPROL-XL) 25 MG 24 hr tablet TAKE 1/2 (ONE-HALF) TABLET BY MOUTH ONCE DAILY 45 tablet 3  . omeprazole (PRILOSEC) 40 MG capsule TAKE 1 CAPSULE BY MOUTH ONCE DAILY 90 capsule 3  . QUEtiapine (SEROQUEL) 25 MG tablet Take 1 tablet (25 mg total) by mouth at bedtime. 30 tablet 0  . tamsulosin (FLOMAX) 0.4 MG CAPS capsule Take 1 capsule (0.4 mg total) by mouth daily. 30 capsule 0  . traMADol (ULTRAM) 50 MG tablet Take 1 tablet (50 mg total) by mouth every 6 (six) hours as needed. 15 tablet 0  . Vitamin D, Ergocalciferol, (DRISDOL) 50000 units CAPS capsule TAKE 1 CAPSULE BY MOUTH ONCE A WEEK 12 capsule 0   No facility-administered medications prior to visit.     ROS: Review of Systems  Constitutional: Negative for appetite change, fatigue and unexpected weight change.  HENT: Negative for congestion, nosebleeds, sneezing, sore throat and trouble swallowing.   Eyes: Negative for itching and  visual disturbance.  Respiratory: Negative for cough.   Cardiovascular: Negative for chest pain, palpitations and leg swelling.  Gastrointestinal: Negative for abdominal distention, blood in stool, diarrhea and nausea.  Genitourinary: Negative for frequency and hematuria.  Musculoskeletal: Positive for gait problem. Negative for back pain, joint swelling and neck pain.  Skin: Negative for rash.  Neurological: Negative for dizziness, tremors, speech difficulty and weakness.  Psychiatric/Behavioral: Positive for confusion, decreased concentration and dysphoric mood. Negative for agitation, sleep disturbance and suicidal ideas. The patient is not nervous/anxious.     Objective:  BP 132/82 (BP Location: Left Arm, Patient Position: Sitting, Cuff Size: Normal)   Pulse 72   Ht 6' (1.829 m)   Wt 192 lb (87.1 kg)   SpO2 97%   BMI 26.04 kg/m   BP Readings from Last 3 Encounters:  10/15/17 132/82  10/15/17 132/82  09/20/17 127/81    Wt Readings from Last 3 Encounters:  10/15/17 192 lb (87.1 kg)  10/15/17 192 lb (87.1 kg)  09/20/17 205 lb (93 kg)    Physical Exam  Constitutional: He is oriented to person, place, and time. He appears well-developed. No distress.  NAD  HENT:  Mouth/Throat: Oropharynx is clear and moist.  Eyes: Pupils are equal, round, and reactive to light. Conjunctivae are normal.  Neck: Normal range of motion. No JVD present. No thyromegaly present.  Cardiovascular: Normal rate, regular rhythm,  normal heart sounds and intact distal pulses. Exam reveals no gallop and no friction rub.  No murmur heard. Pulmonary/Chest: Effort normal and breath sounds normal. No respiratory distress. He has no wheezes. He has no rales. He exhibits no tenderness.  Abdominal: Soft. Bowel sounds are normal. He exhibits no distension and no mass. There is no tenderness. There is no rebound and no guarding.  Musculoskeletal: Normal range of motion. He exhibits no edema or tenderness.    Lymphadenopathy:    He has no cervical adenopathy.  Neurological: He is alert and oriented to person, place, and time. He has normal reflexes. No cranial nerve deficit. He exhibits normal muscle tone. He displays a negative Romberg sign. Coordination abnormal. Gait normal.  Skin: Skin is warm and dry. No rash noted.  Psychiatric: His behavior is normal.  in a w/c Alert, cooperative, disoriented   Lab Results  Component Value Date   WBC 8.5 09/18/2017   HGB 13.3 09/18/2017   HCT 40.4 09/18/2017   PLT 194 09/18/2017   GLUCOSE 87 09/18/2017   CHOL 223 (H) 03/31/2015   TRIG 125.0 03/31/2015   HDL 40.00 03/31/2015   LDLDIRECT 189.0 01/28/2009   LDLCALC 158 (H) 03/31/2015   ALT 19 09/17/2017   AST 48 (H) 09/17/2017   NA 144 09/18/2017   K 3.9 09/18/2017   CL 111 09/18/2017   CREATININE 0.92 09/18/2017   BUN 21 09/18/2017   CO2 27 09/18/2017   TSH 1.616 09/17/2017   PSA 2.77 03/31/2015   INR 1.17 04/06/2015    Dg Chest 2 View  Result Date: 09/17/2017 CLINICAL DATA:  Syncope EXAM: CHEST - 2 VIEW COMPARISON:  04/02/2016 and prior exams FINDINGS: Cardiomegaly and CABG changes again noted. Elevation of the LEFT hemidiaphragm is unchanged. There is no evidence of focal airspace disease, pulmonary edema, suspicious pulmonary nodule/mass, pleural effusion, or pneumothorax. No acute bony abnormalities are identified. IMPRESSION: Cardiomegaly without evidence of acute cardiopulmonary disease. Electronically Signed   By: Margarette Canada M.D.   On: 09/17/2017 12:12   Ct Head Wo Contrast  Result Date: 09/17/2017 CLINICAL DATA:  84 year old male found on floor. Patient does not remember what happened. Denies pain. Initial encounter. EXAM: CT HEAD WITHOUT CONTRAST CT CERVICAL SPINE WITHOUT CONTRAST TECHNIQUE: Multidetector CT imaging of the head and cervical spine was performed following the standard protocol without intravenous contrast. Multiplanar CT image reconstructions of the cervical spine  were also generated. COMPARISON:  07/12/2015 CT. FINDINGS: CT HEAD FINDINGS Brain: No intracranial hemorrhage or CT evidence of large acute infarct. Chronic microvascular changes. Global atrophy. No intracranial mass lesion noted on this unenhanced exam. Vascular: Vascular calcifications Skull: No skull fracture Sinuses/Orbits: No acute orbital abnormality. Moderate mucosal thickening right maxillary sinus. Minimal mucosal thickening ethmoid sinus air cells. Partial opacification posterior right sphenoid sinus. Other: Mastoid air cells and middle ear cavities are clear. CT CERVICAL SPINE FINDINGS Alignment: Minimal anterior slip C4 and C7 similar to prior exam. Skull base and vertebrae: No acute cervical spine fracture. Soft tissues and spinal canal: No abnormal prevertebral soft tissue swelling. Disc levels: Multilevel cervical spondylotic changes most prominent C5-6 and C6-7. Transverse ligament hypertrophy with partial calcification. Upper chest: Scarring lung apices greater on right. Other: Aortic calcification and carotid artery calcification. IMPRESSION: No skull fracture or intracranial hemorrhage. Chronic microvascular changes without CT evidence of large acute infarct. Atrophy. No acute cervical spine fracture. Alignment similar to prior exam. No abnormal prevertebral soft tissue swelling. Multifactorial cervical spondylotic changes most notable C5-6  and C6-7. Paranasal sinus mucosal thickening/opacification most notable involving the right maxillary sinus. Aortic Atherosclerosis (ICD10-I70.0). Electronically Signed   By: Genia Del M.D.   On: 09/17/2017 12:48   Ct Cervical Spine Wo Contrast  Result Date: 09/17/2017 CLINICAL DATA:  84 year old male found on floor. Patient does not remember what happened. Denies pain. Initial encounter. EXAM: CT HEAD WITHOUT CONTRAST CT CERVICAL SPINE WITHOUT CONTRAST TECHNIQUE: Multidetector CT imaging of the head and cervical spine was performed following the  standard protocol without intravenous contrast. Multiplanar CT image reconstructions of the cervical spine were also generated. COMPARISON:  07/12/2015 CT. FINDINGS: CT HEAD FINDINGS Brain: No intracranial hemorrhage or CT evidence of large acute infarct. Chronic microvascular changes. Global atrophy. No intracranial mass lesion noted on this unenhanced exam. Vascular: Vascular calcifications Skull: No skull fracture Sinuses/Orbits: No acute orbital abnormality. Moderate mucosal thickening right maxillary sinus. Minimal mucosal thickening ethmoid sinus air cells. Partial opacification posterior right sphenoid sinus. Other: Mastoid air cells and middle ear cavities are clear. CT CERVICAL SPINE FINDINGS Alignment: Minimal anterior slip C4 and C7 similar to prior exam. Skull base and vertebrae: No acute cervical spine fracture. Soft tissues and spinal canal: No abnormal prevertebral soft tissue swelling. Disc levels: Multilevel cervical spondylotic changes most prominent C5-6 and C6-7. Transverse ligament hypertrophy with partial calcification. Upper chest: Scarring lung apices greater on right. Other: Aortic calcification and carotid artery calcification. IMPRESSION: No skull fracture or intracranial hemorrhage. Chronic microvascular changes without CT evidence of large acute infarct. Atrophy. No acute cervical spine fracture. Alignment similar to prior exam. No abnormal prevertebral soft tissue swelling. Multifactorial cervical spondylotic changes most notable C5-6 and C6-7. Paranasal sinus mucosal thickening/opacification most notable involving the right maxillary sinus. Aortic Atherosclerosis (ICD10-I70.0). Electronically Signed   By: Genia Del M.D.   On: 09/17/2017 12:48    Assessment & Plan:   There are no diagnoses linked to this encounter.   No orders of the defined types were placed in this encounter.    Follow-up: No follow-ups on file.  Walker Kehr, MD

## 2017-10-15 NOTE — Assessment & Plan Note (Signed)
On Levothroid 

## 2017-10-15 NOTE — Assessment & Plan Note (Signed)
Worse Treat sinusitis Start Aricept

## 2017-10-23 NOTE — Addendum Note (Signed)
Addended by: Cassandria Anger on: 10/23/2017 07:32 AM   Modules accepted: Level of Service

## 2017-11-07 DIAGNOSIS — E538 Deficiency of other specified B group vitamins: Secondary | ICD-10-CM

## 2017-11-07 DIAGNOSIS — G5793 Unspecified mononeuropathy of bilateral lower limbs: Secondary | ICD-10-CM

## 2017-11-07 DIAGNOSIS — I719 Aortic aneurysm of unspecified site, without rupture: Secondary | ICD-10-CM

## 2017-11-07 DIAGNOSIS — M6281 Muscle weakness (generalized): Secondary | ICD-10-CM

## 2017-11-07 DIAGNOSIS — Z951 Presence of aortocoronary bypass graft: Secondary | ICD-10-CM

## 2017-11-07 DIAGNOSIS — G8929 Other chronic pain: Secondary | ICD-10-CM

## 2017-11-07 DIAGNOSIS — I2510 Atherosclerotic heart disease of native coronary artery without angina pectoris: Secondary | ICD-10-CM | POA: Diagnosis not present

## 2017-11-07 DIAGNOSIS — Z9181 History of falling: Secondary | ICD-10-CM

## 2017-11-07 DIAGNOSIS — E785 Hyperlipidemia, unspecified: Secondary | ICD-10-CM

## 2017-11-07 DIAGNOSIS — W19XXXD Unspecified fall, subsequent encounter: Secondary | ICD-10-CM

## 2017-11-07 DIAGNOSIS — Z7982 Long term (current) use of aspirin: Secondary | ICD-10-CM

## 2017-11-07 DIAGNOSIS — F0390 Unspecified dementia without behavioral disturbance: Secondary | ICD-10-CM

## 2017-11-07 DIAGNOSIS — N400 Enlarged prostate without lower urinary tract symptoms: Secondary | ICD-10-CM

## 2017-11-07 DIAGNOSIS — E559 Vitamin D deficiency, unspecified: Secondary | ICD-10-CM

## 2017-11-07 DIAGNOSIS — R2681 Unsteadiness on feet: Secondary | ICD-10-CM

## 2017-11-07 DIAGNOSIS — C4362 Malignant melanoma of left upper limb, including shoulder: Secondary | ICD-10-CM

## 2017-11-07 DIAGNOSIS — I10 Essential (primary) hypertension: Secondary | ICD-10-CM

## 2017-11-07 DIAGNOSIS — Z79891 Long term (current) use of opiate analgesic: Secondary | ICD-10-CM

## 2017-12-29 ENCOUNTER — Emergency Department (HOSPITAL_COMMUNITY): Payer: Medicare Other

## 2017-12-29 ENCOUNTER — Other Ambulatory Visit: Payer: Self-pay

## 2017-12-29 ENCOUNTER — Encounter (HOSPITAL_COMMUNITY): Payer: Self-pay

## 2017-12-29 ENCOUNTER — Inpatient Hospital Stay (HOSPITAL_COMMUNITY): Payer: Medicare Other

## 2017-12-29 ENCOUNTER — Inpatient Hospital Stay (HOSPITAL_COMMUNITY)
Admission: EM | Admit: 2017-12-29 | Discharge: 2018-01-01 | DRG: 602 | Disposition: A | Discharge status: Skilled Nursing Facility | Payer: Medicare Other | Attending: Internal Medicine | Admitting: Internal Medicine

## 2017-12-29 ENCOUNTER — Encounter (HOSPITAL_COMMUNITY): Payer: Medicare Other

## 2017-12-29 DIAGNOSIS — Z7982 Long term (current) use of aspirin: Secondary | ICD-10-CM | POA: Diagnosis not present

## 2017-12-29 DIAGNOSIS — I482 Chronic atrial fibrillation, unspecified: Secondary | ICD-10-CM | POA: Diagnosis present

## 2017-12-29 DIAGNOSIS — F0391 Unspecified dementia with behavioral disturbance: Secondary | ICD-10-CM | POA: Diagnosis present

## 2017-12-29 DIAGNOSIS — I2510 Atherosclerotic heart disease of native coronary artery without angina pectoris: Secondary | ICD-10-CM | POA: Diagnosis present

## 2017-12-29 DIAGNOSIS — L03116 Cellulitis of left lower limb: Secondary | ICD-10-CM

## 2017-12-29 DIAGNOSIS — L309 Dermatitis, unspecified: Secondary | ICD-10-CM | POA: Diagnosis present

## 2017-12-29 DIAGNOSIS — K219 Gastro-esophageal reflux disease without esophagitis: Secondary | ICD-10-CM | POA: Diagnosis present

## 2017-12-29 DIAGNOSIS — Z888 Allergy status to other drugs, medicaments and biological substances status: Secondary | ICD-10-CM | POA: Diagnosis not present

## 2017-12-29 DIAGNOSIS — R29810 Facial weakness: Secondary | ICD-10-CM

## 2017-12-29 DIAGNOSIS — Y92129 Unspecified place in nursing home as the place of occurrence of the external cause: Secondary | ICD-10-CM

## 2017-12-29 DIAGNOSIS — I10 Essential (primary) hypertension: Secondary | ICD-10-CM | POA: Diagnosis present

## 2017-12-29 DIAGNOSIS — Z66 Do not resuscitate: Secondary | ICD-10-CM | POA: Diagnosis present

## 2017-12-29 DIAGNOSIS — W19XXXA Unspecified fall, initial encounter: Secondary | ICD-10-CM | POA: Diagnosis not present

## 2017-12-29 DIAGNOSIS — E785 Hyperlipidemia, unspecified: Secondary | ICD-10-CM | POA: Diagnosis present

## 2017-12-29 DIAGNOSIS — E039 Hypothyroidism, unspecified: Secondary | ICD-10-CM | POA: Diagnosis present

## 2017-12-29 DIAGNOSIS — Z993 Dependence on wheelchair: Secondary | ICD-10-CM | POA: Diagnosis not present

## 2017-12-29 DIAGNOSIS — R29898 Other symptoms and signs involving the musculoskeletal system: Secondary | ICD-10-CM | POA: Diagnosis not present

## 2017-12-29 DIAGNOSIS — I878 Other specified disorders of veins: Secondary | ICD-10-CM | POA: Diagnosis present

## 2017-12-29 DIAGNOSIS — Z85820 Personal history of malignant melanoma of skin: Secondary | ICD-10-CM

## 2017-12-29 DIAGNOSIS — Z515 Encounter for palliative care: Secondary | ICD-10-CM | POA: Diagnosis not present

## 2017-12-29 DIAGNOSIS — L0390 Cellulitis, unspecified: Secondary | ICD-10-CM | POA: Diagnosis not present

## 2017-12-29 DIAGNOSIS — Z79899 Other long term (current) drug therapy: Secondary | ICD-10-CM | POA: Diagnosis not present

## 2017-12-29 DIAGNOSIS — I7121 Aneurysm of the ascending aorta, without rupture: Secondary | ICD-10-CM

## 2017-12-29 DIAGNOSIS — I248 Other forms of acute ischemic heart disease: Secondary | ICD-10-CM | POA: Diagnosis not present

## 2017-12-29 DIAGNOSIS — R609 Edema, unspecified: Secondary | ICD-10-CM | POA: Diagnosis not present

## 2017-12-29 DIAGNOSIS — I4510 Unspecified right bundle-branch block: Secondary | ICD-10-CM | POA: Diagnosis present

## 2017-12-29 DIAGNOSIS — G9340 Encephalopathy, unspecified: Secondary | ICD-10-CM | POA: Diagnosis not present

## 2017-12-29 DIAGNOSIS — F03918 Unspecified dementia, unspecified severity, with other behavioral disturbance: Secondary | ICD-10-CM

## 2017-12-29 DIAGNOSIS — I4891 Unspecified atrial fibrillation: Secondary | ICD-10-CM

## 2017-12-29 DIAGNOSIS — I712 Thoracic aortic aneurysm, without rupture: Secondary | ICD-10-CM | POA: Diagnosis present

## 2017-12-29 DIAGNOSIS — G9341 Metabolic encephalopathy: Secondary | ICD-10-CM | POA: Diagnosis present

## 2017-12-29 DIAGNOSIS — Z885 Allergy status to narcotic agent status: Secondary | ICD-10-CM

## 2017-12-29 DIAGNOSIS — R509 Fever, unspecified: Secondary | ICD-10-CM

## 2017-12-29 DIAGNOSIS — Z951 Presence of aortocoronary bypass graft: Secondary | ICD-10-CM | POA: Diagnosis not present

## 2017-12-29 DIAGNOSIS — R451 Restlessness and agitation: Secondary | ICD-10-CM | POA: Diagnosis present

## 2017-12-29 DIAGNOSIS — Y92009 Unspecified place in unspecified non-institutional (private) residence as the place of occurrence of the external cause: Secondary | ICD-10-CM | POA: Diagnosis not present

## 2017-12-29 DIAGNOSIS — R296 Repeated falls: Secondary | ICD-10-CM | POA: Diagnosis present

## 2017-12-29 HISTORY — DX: Thoracic aortic aneurysm, without rupture: I71.2

## 2017-12-29 HISTORY — DX: Unspecified atrial fibrillation: I48.91

## 2017-12-29 LAB — URINALYSIS, ROUTINE W REFLEX MICROSCOPIC
BILIRUBIN URINE: NEGATIVE
Bacteria, UA: NONE SEEN
GLUCOSE, UA: NEGATIVE mg/dL
Ketones, ur: 5 mg/dL — AB
Leukocytes, UA: NEGATIVE
NITRITE: NEGATIVE
PROTEIN: NEGATIVE mg/dL
Specific Gravity, Urine: 1.017 (ref 1.005–1.030)
pH: 6 (ref 5.0–8.0)

## 2017-12-29 LAB — COMPREHENSIVE METABOLIC PANEL
ALK PHOS: 66 U/L (ref 38–126)
ALT: 13 U/L (ref 0–44)
AST: 24 U/L (ref 15–41)
Albumin: 3.8 g/dL (ref 3.5–5.0)
Anion gap: 7 (ref 5–15)
BUN: 12 mg/dL (ref 8–23)
CHLORIDE: 107 mmol/L (ref 98–111)
CO2: 24 mmol/L (ref 22–32)
Calcium: 9.2 mg/dL (ref 8.9–10.3)
Creatinine, Ser: 1.05 mg/dL (ref 0.61–1.24)
Glucose, Bld: 137 mg/dL — ABNORMAL HIGH (ref 70–99)
POTASSIUM: 4.1 mmol/L (ref 3.5–5.1)
SODIUM: 138 mmol/L (ref 135–145)
Total Bilirubin: 1.4 mg/dL — ABNORMAL HIGH (ref 0.3–1.2)
Total Protein: 7 g/dL (ref 6.5–8.1)

## 2017-12-29 LAB — I-STAT TROPONIN, ED: TROPONIN I, POC: 0.03 ng/mL (ref 0.00–0.08)

## 2017-12-29 LAB — CBC
HCT: 43.4 % (ref 39.0–52.0)
HCT: 42.2 % (ref 39.0–52.0)
Hemoglobin: 13.8 g/dL (ref 13.0–17.0)
Hemoglobin: 13.5 g/dL (ref 13.0–17.0)
MCH: 31.7 pg (ref 26.0–34.0)
MCH: 31.7 pg (ref 26.0–34.0)
MCHC: 31.8 g/dL (ref 30.0–36.0)
MCHC: 32 g/dL (ref 30.0–36.0)
MCV: 99.8 fL (ref 80.0–100.0)
MCV: 99.1 fL (ref 80.0–100.0)
NRBC: 0 % (ref 0.0–0.2)
PLATELETS: 188 10*3/uL (ref 150–400)
Platelets: 192 10*3/uL (ref 150–400)
RBC: 4.35 MIL/uL (ref 4.22–5.81)
RBC: 4.26 MIL/uL (ref 4.22–5.81)
RDW: 12.8 % (ref 11.5–15.5)
RDW: 12.7 % (ref 11.5–15.5)
WBC: 11 10*3/uL — AB (ref 4.0–10.5)
WBC: 12 10*3/uL — ABNORMAL HIGH (ref 4.0–10.5)
nRBC: 0 % (ref 0.0–0.2)

## 2017-12-29 LAB — I-STAT CHEM 8, ED
BUN: 18 mg/dL (ref 8–23)
Calcium, Ion: 1.13 mmol/L — ABNORMAL LOW (ref 1.15–1.40)
Chloride: 103 mmol/L (ref 98–111)
Creatinine, Ser: 1 mg/dL (ref 0.61–1.24)
Glucose, Bld: 131 mg/dL — ABNORMAL HIGH (ref 70–99)
HEMATOCRIT: 42 % (ref 39.0–52.0)
HEMOGLOBIN: 14.3 g/dL (ref 13.0–17.0)
Potassium: 4.5 mmol/L (ref 3.5–5.1)
SODIUM: 140 mmol/L (ref 135–145)
TCO2: 30 mmol/L (ref 22–32)

## 2017-12-29 LAB — I-STAT CG4 LACTIC ACID, ED
LACTIC ACID, VENOUS: 1.61 mmol/L (ref 0.5–1.9)
Lactic Acid, Venous: 1.96 mmol/L — ABNORMAL HIGH (ref 0.5–1.9)

## 2017-12-29 LAB — DIFFERENTIAL
Abs Immature Granulocytes: 0.03 10*3/uL (ref 0.00–0.07)
BASOS ABS: 0 10*3/uL (ref 0.0–0.1)
Basophils Relative: 0 %
EOS PCT: 0 %
Eosinophils Absolute: 0 10*3/uL (ref 0.0–0.5)
Immature Granulocytes: 0 %
Lymphocytes Relative: 8 %
Lymphs Abs: 0.9 10*3/uL (ref 0.7–4.0)
MONO ABS: 0.6 10*3/uL (ref 0.1–1.0)
Monocytes Relative: 6 %
NEUTROS PCT: 86 %
Neutro Abs: 9.4 10*3/uL — ABNORMAL HIGH (ref 1.7–7.7)

## 2017-12-29 LAB — GLUCOSE, CAPILLARY: Glucose-Capillary: 124 mg/dL — ABNORMAL HIGH (ref 70–99)

## 2017-12-29 LAB — PROTIME-INR
INR: 1.05
PROTHROMBIN TIME: 13.6 s (ref 11.4–15.2)

## 2017-12-29 LAB — TROPONIN I
TROPONIN I: 0.05 ng/mL — AB (ref <0.03)
Troponin I: 0.12 ng/mL (ref <0.03)

## 2017-12-29 LAB — CREATININE, SERUM
CREATININE: 1.01 mg/dL (ref 0.61–1.24)
GFR calc non Af Amer: >60 mL/min (ref >60)

## 2017-12-29 LAB — CBG MONITORING, ED: GLUCOSE-CAPILLARY: 131 mg/dL — AB (ref 70–99)

## 2017-12-29 LAB — APTT: APTT: 32 s (ref 24–36)

## 2017-12-29 MED ORDER — POLYETHYLENE GLYCOL 3350 17 G PO PACK: 17.0000 g | PACK | Freq: Every day | ORAL | Status: DC | PRN

## 2017-12-29 MED ORDER — QUETIAPINE FUMARATE 25 MG PO TABS
25.0000 mg | ORAL_TABLET | Freq: Two times a day (BID) | ORAL | Status: DC
  Administered 2017-12-30 – 2017-12-31 (×4): 25 mg via ORAL
  Filled 2017-12-29 (×4): qty 1

## 2017-12-29 MED ORDER — VITAMIN D (ERGOCALCIFEROL) 1.25 MG (50000 UNIT) PO CAPS: 50000.0000 [IU] | ORAL_CAPSULE | ORAL | Status: DC

## 2017-12-29 MED ORDER — ENOXAPARIN SODIUM 40 MG/0.4ML ~~LOC~~ SOLN
40.0000 mg | SUBCUTANEOUS | Status: DC
  Administered 2017-12-29 – 2017-12-31 (×3): 40 mg via SUBCUTANEOUS
  Filled 2017-12-29 (×3): qty 0.4

## 2017-12-29 MED ORDER — ACETAMINOPHEN 325 MG PO TABS
650.0000 mg | ORAL_TABLET | Freq: Four times a day (QID) | ORAL | Status: DC
  Administered 2017-12-30 – 2018-01-01 (×5): 650 mg via ORAL
  Filled 2017-12-29 (×3): qty 2

## 2017-12-29 MED ORDER — DONEPEZIL HCL 5 MG PO TABS
5.0000 mg | ORAL_TABLET | Freq: Every day | ORAL | Status: DC
  Administered 2017-12-30 – 2017-12-31 (×2): 5 mg via ORAL
  Filled 2017-12-29 (×2): qty 1

## 2017-12-29 MED ORDER — HALOPERIDOL LACTATE 5 MG/ML IJ SOLN
2.0000 mg | Freq: Three times a day (TID) | INTRAMUSCULAR | Status: DC | PRN
  Administered 2017-12-29 – 2018-01-01 (×4): 2 mg via INTRAVENOUS
  Filled 2017-12-29 (×4): qty 1

## 2017-12-29 MED ORDER — CEFAZOLIN SODIUM-DEXTROSE 1-4 GM/50ML-% IV SOLN
1.0000 g | Freq: Three times a day (TID) | INTRAVENOUS | Status: DC
  Administered 2017-12-29 – 2018-01-01 (×8): 1 g via INTRAVENOUS
  Filled 2017-12-29 (×10): qty 50

## 2017-12-29 MED ORDER — TRAZODONE HCL 50 MG PO TABS: 25.0000 mg | ORAL_TABLET | ORAL | Status: DC | PRN

## 2017-12-29 MED ORDER — LEVOTHYROXINE SODIUM 125 MCG PO TABS
125.0000 ug | ORAL_TABLET | Freq: Every day | ORAL | Status: DC
  Administered 2017-12-31 – 2018-01-01 (×2): 125 ug via ORAL
  Filled 2017-12-29 (×3): qty 1

## 2017-12-29 MED ORDER — METOPROLOL SUCCINATE ER 50 MG PO TB24
50.0000 mg | ORAL_TABLET | Freq: Every day | ORAL | Status: DC
  Filled 2017-12-29: qty 1

## 2017-12-29 MED ORDER — ACETAMINOPHEN 325 MG PO TABS
650.0000 mg | ORAL_TABLET | Freq: Four times a day (QID) | ORAL | Status: DC
  Administered 2017-12-30 – 2017-12-31 (×4): 650 mg via ORAL
  Filled 2017-12-29 (×6): qty 2

## 2017-12-29 MED ORDER — VITAMIN B-12 1000 MCG PO TABS
1000.0000 ug | ORAL_TABLET | Freq: Every day | ORAL | Status: DC
  Administered 2017-12-30 – 2018-01-01 (×3): 1000 ug via ORAL
  Filled 2017-12-29 (×3): qty 1

## 2017-12-29 MED ORDER — CEFAZOLIN SODIUM-DEXTROSE 1-4 GM/50ML-% IV SOLN
1.0000 g | Freq: Once | INTRAVENOUS | Status: AC
  Administered 2017-12-29: 1 g via INTRAVENOUS
  Filled 2017-12-29: qty 50

## 2017-12-29 MED ORDER — PANTOPRAZOLE SODIUM 40 MG PO TBEC
40.0000 mg | DELAYED_RELEASE_TABLET | Freq: Every day | ORAL | Status: DC
  Administered 2017-12-30 – 2018-01-01 (×3): 40 mg via ORAL
  Filled 2017-12-29 (×4): qty 1

## 2017-12-29 MED ORDER — METOPROLOL SUCCINATE ER 50 MG PO TB24
50.0000 mg | ORAL_TABLET | Freq: Every day | ORAL | Status: DC
  Administered 2017-12-30 – 2018-01-01 (×3): 50 mg via ORAL
  Filled 2017-12-29 (×3): qty 1

## 2017-12-29 MED ORDER — ACETAMINOPHEN 650 MG RE SUPP
650.0000 mg | Freq: Four times a day (QID) | RECTAL | Status: DC
  Administered 2017-12-29: 650 mg via RECTAL
  Filled 2017-12-29: qty 1

## 2017-12-29 MED ORDER — HALOPERIDOL LACTATE 5 MG/ML IJ SOLN
2.5000 mg | Freq: Once | INTRAMUSCULAR | Status: AC
  Administered 2017-12-29: 2.5 mg via INTRAMUSCULAR
  Filled 2017-12-29: qty 1

## 2017-12-29 MED ORDER — FLUTICASONE PROPIONATE 50 MCG/ACT NA SUSP
1.0000 | Freq: Every day | NASAL | Status: DC
  Administered 2017-12-31: 1 via NASAL
  Filled 2017-12-29: qty 16

## 2017-12-29 MED ORDER — TAMSULOSIN HCL 0.4 MG PO CAPS
0.4000 mg | ORAL_CAPSULE | Freq: Every day | ORAL | Status: DC
  Administered 2017-12-30 – 2018-01-01 (×3): 0.4 mg via ORAL
  Filled 2017-12-29 (×3): qty 1

## 2017-12-29 MED ORDER — OLANZAPINE 10 MG IM SOLR: 5.0000 mg | Freq: Once | INTRAMUSCULAR | Status: DC

## 2017-12-29 MED ORDER — ASPIRIN EC 325 MG PO TBEC
325.0000 mg | DELAYED_RELEASE_TABLET | Freq: Every day | ORAL | Status: DC
  Administered 2017-12-30 – 2018-01-01 (×3): 325 mg via ORAL
  Filled 2017-12-29 (×3): qty 1

## 2017-12-29 NOTE — Code Documentation (Signed)
Patient from nursing facility.  Per staff had a fall at 0100 this am but was his normal self at that time.  After he awoke this am he had left facial droop, slurred speech, left side weakness.  Stat labs and CT head & neck done.  Dr Leonel Ramsay at bedside to assess patient.  NIHSS 11: facial droop, unable to answer questions and weakness all extremities.  No Acute treatment: Hand off with ED RN.

## 2017-12-29 NOTE — ED Notes (Signed)
THE FIRST EKG WAS SHOWN TO DR.REES.AND THE REPEAT EKG SHOWN TO DR.REES AS WELL

## 2017-12-29 NOTE — Progress Notes (Signed)
12/29/17 @ 1415  Pt combative, attempting to punch and scratch technologist while trying to take mr unsafe lead patches off. Waiting to hear back from RN about meds.

## 2017-12-29 NOTE — H&P (Signed)
History and Physical    DOA: 12/29/2017  PCP: Cassandria Anger, MD  Patient coming from: [place] home  Chief Complaint: Confusion  HPI: Barry Molina is a 84 y.o. male with history h/o dementia who resides at assisted living facility, hyperlipidemia, hypertension, CAD status post CABG, hypothyroidism who was recently admitted to this [facility] in August after a mechanical fall, subsequently discharged to a rehab center and then transferred to assisted living facility brought in today with another fall and ongoing confusion. Patient currently restless and disoriented, most of the history obtained by talking to daughters bedside.  According to family, patient's mental status has progressively declined over the last year.  He was requiring 5 days a week home health services prior to last admission and has had 3 falls so far this year.  He has also had intermittent episodes of agitation at the assisted living facility and at some point started on Seroquel with recent dosage adjustment on last Sunday.  Patient's son-in-law apparently visits him every day and yesterday morning patient seemed to be somewhat tired but mental status at his baseline.  He is usually wheelchair dependent and uses walker with assistance.  This morning when son-in-law visited him, patient was sitting in the chair and appeared to be slumped to his side with his mouth somewhat pulled to the left.  Nursing staff apparently informed family that patient was found on the floor on his buttocks last night due to a presumed fall. Code stroke was called on his arrival and subsequently canceled after neurology evaluation as he was felt to have delirium and no focal deficits were noted.  He is also noted to have fever with T-max of 101.6 here.  He has been agitated and noted to be spitting up phlegm.  EKG shows new onset A. fib, rate controlled.  Lab work revealed WBC of 12 k, chest x-ray shows bibasilar atelectasis CT head and  CT spine did not reveal any acute injuries.  Patient noted to have cellulitic changes in the left lower extremity, received IV cefazolin in the ED.  He is requested to be admitted for further evaluation and management.   Review of Systems: As per HPI otherwise 10 point review of systems negative.    Past Medical History:  Diagnosis Date  . B12 DEFICIENCY 12/09/2007  . Cataract   . COLONIC POLYPS, HX OF 12/14/2006  . CORONARY ARTERY BYPASS GRAFT, HX OF 08/26/2008  . CORONARY ARTERY DISEASE 12/14/2006  . Edema 08/27/2008  . ELBOW PAIN 10/06/2009  . GERD 12/14/2006  . HYPERLIPIDEMIA 12/14/2006  . HYPERTENSION 12/14/2006  . HYPOTHYROIDISM 12/14/2006  . LEG PAIN 05/10/2010  . Malignant melanoma of skin of upper limb, including shoulder ([place]) 09/07/2008   states he just had places removed;  not cancers  . MUSCLE PAIN 04/10/2007  . NEOPLASM, SKIN, UNCERTAIN BEHAVIOR 2/95/6213  . Other specified acquired hypothyroidism 04/08/2007  . Personal history of other diseases of digestive disease 08/26/2008  . SHOULDER PAIN 12/19/2006    Past Surgical History:  Procedure Laterality Date  . BACK SURGERY  09/27/10  . CHOLECYSTECTOMY  9/12  . CORONARY ARTERY BYPASS GRAFT    . right ankle surgery    . ROTATOR CUFF REPAIR     bilateral  . SPINE SURGERY  8/12   LS spine cyst    Social history: No history of smoking or alcohol or drug abuse   Allergies  Allergen Reactions  . Ezetimibe-Simvastatin Other (See Comments)  aches  . Morphine Nausea And Vomiting    Family History  Problem Relation Age of Onset  . Hypertension Father   . Cancer Father   . Stroke Mother 101  . Heart disease Other       Prior to Admission medications   Medication Sig Start Date End Date Taking? Authorizing Provider  acetaminophen (TYLENOL) 325 MG tablet Take 650 mg by mouth 4 (four) times daily. Take ever day per daughter   Yes [provider]  aspirin 325 MG EC tablet Take 325 mg by mouth daily.   Yes  [provider]  cyanocobalamin 1000 MCG tablet Take 1,000 mcg by mouth daily.   Yes [provider]  diclofenac sodium (VOLTAREN) 1 % GEL Apply 2 g topically as needed.   Yes [provider]  fluticasone (FLONASE) 50 MCG/ACT nasal spray USE 1 SPRAY IN EACH NOSTRIL DAILY AS NEEDED 02/14/12  Yes Plotnikov, Evie Lacks, MD  furosemide (LASIX) 20 MG tablet TAKE 1 TO 2 TABLETS BY MOUTH ONCE DAILY AS NEEDED FOR EDEMA Patient taking differently: Take 20 mg by mouth as needed.  07/24/17  Yes Plotnikov, Evie Lacks, MD  levothyroxine (SYNTHROID, LEVOTHROID) 125 MCG tablet TAKE 1 TABLET BY MOUTH ONCE DAILY Patient taking differently: Take 125 mcg by mouth daily before breakfast.  05/14/17  Yes Plotnikov, Evie Lacks, MD  metoprolol succinate (TOPROL-XL) 25 MG 24 hr tablet TAKE 1/2 (ONE-HALF) TABLET BY MOUTH ONCE DAILY Patient taking differently: Take 25 mg by mouth daily.  09/16/17  Yes Plotnikov, Evie Lacks, MD  omeprazole (PRILOSEC) 40 MG capsule TAKE 1 CAPSULE BY MOUTH ONCE DAILY Patient taking differently: 40 mg daily.  08/27/17  Yes Plotnikov, Evie Lacks, MD  QUEtiapine (SEROQUEL) 25 MG tablet Take 1 tablet (25 mg total) by mouth at bedtime. Patient taking differently: Take 12.5-25 mg by mouth See admin instructions. Take 12.5 mg in the morning and 25 mg in the evening 09/20/17  Yes Adhikari, Tamsen Meek, MD  tamsulosin (FLOMAX) 0.4 MG CAPS capsule Take 1 capsule (0.4 mg total) by mouth daily. 09/20/17  Yes Shelly Coss, MD  traMADol (ULTRAM) 50 MG tablet Take 1 tablet (50 mg total) by mouth every 6 (six) hours as needed. 09/20/17  Yes Shelly Coss, MD  traZODone (DESYREL) 50 MG tablet Take 25 mg by mouth as needed for sleep (Agitation or restlessness).   Yes [provider]  Vitamin D, Ergocalciferol, (DRISDOL) 50000 units CAPS capsule TAKE 1 CAPSULE BY MOUTH ONCE A WEEK Patient taking differently: Take 50,000 Units by mouth every 7 (seven) days. Friday 09/20/17  Yes Lyndal Pulley, DO  cefdinir (OMNICEF) 300 MG capsule Take 1 capsule (300 mg total) by mouth 2 (two) times daily. Patient not taking: Reported on 12/29/2017 10/15/17   Plotnikov, Evie Lacks, MD  donepezil (ARICEPT) 5 MG tablet Take 1 tablet (5 mg total) by mouth at bedtime. Patient not taking: Reported on 12/29/2017 10/15/17   Plotnikov, Evie Lacks, MD    Physical Exam: Vitals:   12/29/17 0917  BP: (!) 183/146  Pulse: (!) 111  Resp: (!) 25  Temp: (!) 101.6 F (38.7 C)  TempSrc: Rectal  SpO2: 93%     Vitals:   12/29/17 0917  BP: (!) 183/146  Pulse: (!) 111  Resp: (!) 25  Temp: (!) 101.6 F (38.7 C)  TempSrc: Rectal  SpO2: 93%   Constitutional: Patient restless and pulling on sheets, disoriented Eyes: PERRL, lids and conjunctivae normal ENMT: Mucous membranes are dry.  Noted to be coughing and bringing up phlegm into his mouth Neck: normal, supple, no masses, no thyromegaly Respiratory: clear to auscultation bilaterally, no wheezing, no crackles. Normal respiratory effort. No accessory muscle use.  Cardiovascular: irregular rate and rhythm, no murmurs / rubs / gallops.  Trace lower extremity edema. 2+ pedal pulses. No carotid bruits.  Abdomen: no tenderness, no masses palpated. No hepatosplenomegaly. Bowel sounds positive.   Neurologic: Could not test in detail given mental status.  Awake and alert.  Moving all extremities, no obvious facial droop noted Psychiatric: Normal judgment and insight. Alert and oriented x 3. Normal mood.  SKIN/catheters: Eczematous dry scaly skin on bilateral lower extremities.  Area of redness and warmth in lower third of left lower extremity.  Labs on Admission: I have personally reviewed following labs and imaging studies  CBC: Recent Labs  Lab 12/29/17 0853 12/29/17 0900  WBC 11.0*  --   NEUTROABS 9.4*  --   HGB 13.8 14.3  HCT 43.4 42.0  MCV 99.8  --   PLT 192  --    Basic Metabolic Panel: Recent Labs  Lab 12/29/17 0853 12/29/17 0900  NA 138 140    K 4.1 4.5  CL 107 103  CO2 24  --   GLUCOSE 137* 131*  BUN 12 18  CREATININE 1.05 1.00  CALCIUM 9.2  --    GFR: CrCl cannot be calculated (Unknown ideal weight.). Liver Function Tests: Recent Labs  Lab 12/29/17 0853  AST 24  ALT 13  ALKPHOS 66  BILITOT 1.4*  PROT 7.0  ALBUMIN 3.8   No results for input(s): LIPASE, AMYLASE in the last 168 hours. No results for input(s): AMMONIA in the last 168 hours. Coagulation Profile: Recent Labs  Lab 12/29/17 0853  INR 1.05   Cardiac Enzymes: No results for input(s): CKTOTAL, CKMB, CKMBINDEX, TROPONINI in the last 168 hours. BNP (last 3 results) No results for input(s): PROBNP in the last 8760 hours. HbA1C: No results for input(s): HGBA1C in the last 72 hours. CBG: Recent Labs  Lab 12/29/17 0853  GLUCAP 131*   Lipid Profile: No results for input(s): CHOL, HDL, LDLCALC, TRIG, CHOLHDL, LDLDIRECT in the last 72 hours. Thyroid Function Tests: No results for input(s): TSH, T4TOTAL, FREET4, T3FREE, THYROIDAB in the last 72 hours. Anemia Panel: No results for input(s): VITAMINB12, FOLATE, FERRITIN, TIBC, IRON, RETICCTPCT in the last 72 hours. Urine analysis:    Component Value Date/Time   COLORURINE YELLOW 09/17/2017 1340   APPEARANCEUR CLEAR 09/17/2017 1340   LABSPEC 1.021 09/17/2017 1340   PHURINE 7.0 09/17/2017 1340   GLUCOSEU NEGATIVE 09/17/2017 1340   GLUCOSEU NEGATIVE 07/12/2015 1200   HGBUR SMALL (A) 09/17/2017 1340   BILIRUBINUR NEGATIVE 09/17/2017 1340   KETONESUR 80 (A) 09/17/2017 1340   PROTEINUR 30 (A) 09/17/2017 1340   UROBILINOGEN 1.0 07/12/2015 1200   NITRITE NEGATIVE 09/17/2017 1340   LEUKOCYTESUR NEGATIVE 09/17/2017 1340    Radiological Exams on Admission: Ct Cervical Spine Wo Contrast  Result Date: 12/29/2017 CLINICAL DATA:  Fall.  Acute onset of left facial droop. EXAM: CT CERVICAL SPINE WITHOUT CONTRAST TECHNIQUE: Multidetector CT imaging of the cervical spine was performed without intravenous  contrast. Multiplanar CT image reconstructions were also generated. COMPARISON:  CT of the cervical spine 09/17/2017 FINDINGS: Alignment: AP alignment scratched at slight degenerative anterolisthesis at C5-6 is stable. AP alignment is otherwise anatomic. Mild leftward curvature is stable. Skull base and vertebrae: The craniocervical junction demonstrates mild degenerative change. No acute or healing fractures  are present. Soft tissues and spinal canal: Spinal canal is grossly patent. Atherosclerotic calcifications are present at the carotid bifurcations. No significant cervical adenopathy is present. Disc levels: Multilevel facet changes are worse right than left in the upper cervical spine. Grade 1 anterolisthesis associated with central foraminal narrowing bilaterally at C5-6. Upper chest: Lung apices are clear. Thoracic inlet is within normal limits. IMPRESSION: 1. No acute fracture or traumatic subluxation. 2. Stable degenerative changes of the cervical spine. Electronically Signed   By: San Morelle M.D.   On: 12/29/2017 09:23   Dg Chest Port 1 View  Result Date: 12/29/2017 CLINICAL DATA:  Altered mental status. EXAM: PORTABLE CHEST 1 VIEW COMPARISON:  09/17/2017 FINDINGS: Stable postsurgical changes from CABG. Calcific atherosclerotic disease and tortuosity of the aorta. Low lung volumes with bibasilar atelectasis. Osseous structures are without acute abnormality. Soft tissues are grossly normal. IMPRESSION: Low lung volumes with bibasilar atelectasis. Electronically Signed   By: Fidela Salisbury M.D.   On: 12/29/2017 09:36   Dg Foot 2 Views Left  Result Date: 12/29/2017 CLINICAL DATA:  Fever today, ulcer LEFT foot EXAM: LEFT FOOT - 2 VIEW COMPARISON:  None FINDINGS: Osseous demineralization. Joint spaces preserved. No acute fracture, dislocation, or bone destruction. Diffuse soft tissue swelling at forefoot. No definite soft tissue gas. Scattered small vessel vascular calcifications.  IMPRESSION: Osseous demineralization and soft tissue swelling of forefoot without acute bony abnormalities. Electronically Signed   By: Lavonia Dana M.D.   On: 12/29/2017 09:47   Ct Head Code Stroke Wo Contrast  Result Date: 12/29/2017 CLINICAL DATA:  Code stroke. Code stroke. Focal neuro deficit for less than 6 hours. Left facial droop. EXAM: CT HEAD WITHOUT CONTRAST TECHNIQUE: Contiguous axial images were obtained from the base of the skull through the vertex without intravenous contrast. COMPARISON:  CT head without contrast and cervical spine CT 09/17/2017 FINDINGS: Brain: Moderate diffuse white matter disease is again seen. No acute cortical infarct is present. A remote lacunar infarct is present in the right caudate head. Basal ganglia are otherwise intact. Insular ribbon is normal. Acute or focal cortical abnormality is present. Brainstem and cerebellum are normal. Ventricles are proportionate to the degree of atrophy. Vascular: Atherosclerotic calcifications are present within the cavernous internal carotid arteries bilaterally. There is no asymmetric hyperdense vessel. Visual calcifications are present the dural margin of the vertebral arteries bilaterally. Skull: Calvarium is intact. No focal lytic or blastic lesions are present. Sinuses/Orbits: The right maxillary sinus is opacified. Paranasal sinuses and mastoid air cells are otherwise clear. Bilateral lens replacements are present. Globes and orbits are otherwise within limits. ASPECTS [facility] Stroke Program Early CT Score) - Ganglionic level infarction (caudate, lentiform nuclei, internal capsule, insula, M1-M3 cortex): 7/7 - Supraganglionic infarction (M4-M6 cortex): 3/3 Total score (0-10 with 10 being normal): 10/10 IMPRESSION: 1. No acute intracranial abnormality. 2. Stable atrophy and white matter disease, moderately advanced for age. This likely reflects the sequela of chronic microvascular ischemia. 3. Remote lacunar infarct of the right  caudate head. 4. Progressive right maxillary sinus disease. 5. ASPECTS is 10/10 The above was relayed via text pager to Dr. Leonel Ramsay on 12/29/2017 at 09:19 . Electronically Signed   By: San Morelle M.D.   On: 12/29/2017 09:19    EKG: Independently reviewed.  Atrial fibrillation with right bundle branch block and QTC of 478 ms     Assessment and Plan:   1.  Altered mental status, fall: Patient appears to be delirious in the setting of infection.  Patient tried to get out of the bed during interview and responded to 2.5 mg Haldol.  Sitter ordered for bedside safety observation.  CT head and CT spine negative for any acute findings.  Seen by neurology and MRI head ordered to rule out stroke but low suspicion.  Will attempt MRI when patient less agitated.  Resume Seroquel, increased to 25 mg twice daily.  Watch QTC.  Avoid benzodiazepines.  Will keep n.p.o. and obtain swallow evaluation given aspiration risk.  Chest x-ray negative for infiltrates.  2.  Left lower extremity cellulitis: Continue IV cefazolin.  Given fever and leukocytosis, will send blood cultures  3.  New onset atrial fibrillation: Patient had echo in July 2019 which showed moderate to severe aortic aneurysm, EF 60 to 65% and grade 1 diastolic dysfunction.  Discussed with family regarding rate control and stroke prevention.  Currently heart rate around 80 to 110.  Will increase home med metoprolol dosage.  Resume aspirin.  Patient not a candidate for anticoagulation given advanced age, recurrent falls, bleeding risk as discussed with family.  Family considering palliative care given his progressive decline over the last year.  4.  CAD status post CABG, grade 1 diastolic dysfunction: Resume home medications.  Watch for fluid overload on IV fluids.  5.  Hyperlipidemia/hypertension: Resume home medications  6.  Hypothyroidism: Resume Synthroid  7.  Aortic aneurysm: Family aware of this diagnosis and risks, patient not a  candidate for surgical intervention.  8.  Chronic venous stasis and dermatitis: Not on diuretics at baseline, has trace leg edema.  Topical cream.  DVT prophylaxis: Lovenox  Code Status: DNR as confirmed with daughters/healthcare proxy  Family Communication: Discussed overall poor prognosis and above plan.  Consulted palliative care upon family's request Consults called: Palliative care Admission status:  Patient admitted as inpatient as anticipated LOS greater than 2 midnights    Guilford Shi MD [facility] Pager [phone]  If 7PM-7AM, please contact night-coverage [email or web address] Password [email or web address]  12/29/2017, 1:21 PM

## 2017-12-29 NOTE — Progress Notes (Signed)
RN called both daughters listed on pt file to complete health history/admission but no answer.

## 2017-12-29 NOTE — Consult Note (Signed)
Neurology Consultation Reason for Consult: Altered mental status Referring Physician: Ralene Bathe, E  CC: Altered mental status  History is obtained from: Patient, EMS  HPI: Barry Molina is a 84 y.o. male with a history of mild dementia, bilateral lower extremity weakness who presents with facial droop, altered mental status.  He apparently was sleepy throughout the day yesterday, and fell out of bed around 1 AM.  There is no facial droop noted at that time.  When he woke up this morning, his daughter and family were concerned about confusion,?  Slurred speech, left facial weakness.  He was therefore brought to the emergency department as a code stroke   LKW: Unclear given sleepiness yesterday tpa given?: no, outside of window Premorbid modified Rankin score: 4  ROS: Unable to obtain due to altered mental status.   Past Medical History:  Diagnosis Date  . B12 DEFICIENCY 12/09/2007  . Cataract   . COLONIC POLYPS, HX OF 12/14/2006  . CORONARY ARTERY BYPASS GRAFT, HX OF 08/26/2008  . CORONARY ARTERY DISEASE 12/14/2006  . Edema 08/27/2008  . ELBOW PAIN 10/06/2009  . GERD 12/14/2006  . HYPERLIPIDEMIA 12/14/2006  . HYPERTENSION 12/14/2006  . HYPOTHYROIDISM 12/14/2006  . LEG PAIN 05/10/2010  . Malignant melanoma of skin of upper limb, including shoulder ([place]) 09/07/2008   states he just had places removed;  not cancers  . MUSCLE PAIN 04/10/2007  . NEOPLASM, SKIN, UNCERTAIN BEHAVIOR 9/50/9326  . Other specified acquired hypothyroidism 04/08/2007  . Personal history of other diseases of digestive disease 08/26/2008  . SHOULDER PAIN 12/19/2006     Family History  Problem Relation Age of Onset  . Hypertension Father   . Cancer Father   . Stroke Mother 57  . Heart disease Other      Social History:  reports that he has never smoked. He has never used smokeless tobacco. He reports that he does not drink alcohol or use drugs.   Exam: Current vital signs: BP (!) 183/146 (BP Location: Right  Arm)   Pulse (!) 111   Temp (!) 101.6 F (38.7 C) (Rectal)   Resp (!) 25   SpO2 93%  Vital signs in last 24 hours: Temp:  [101.6 F (38.7 C)] 101.6 F (38.7 C) (11/02 0917) Pulse Rate:  [111] 111 (11/02 0917) Resp:  [25] 25 (11/02 0917) BP: (183)/(146) 183/146 (11/02 0917) SpO2:  [93 %] 93 % (11/02 0917)   Physical Exam  Constitutional: Appears well-developed and well-nourished.  Psych: Affect appropriate to situation Eyes: No scleral injection HENT: No OP obstrucion Head: Normocephalic.  Cardiovascular: Normal rate and regular rhythm.  Respiratory: Effort normal, non-labored breathing GI: Soft.  No distension. There is no tenderness.  Skin: Erythema and warmth over left distal lower extremity  Neuro: Mental Status: Patient is awake, alert, gives ages 37, thinks that he is in [place] Cranial Nerves: II: Visual Fields are full. Pupils are equal, round, and reactive to light.   III,IV, VI: EOMI without ptosis or diploplia.  V: Facial sensation is symmetric to temperature VII: When he smiles his face appears relatively symmetric, however at rest there is a suggestion of left lower facial weakness. VIII: hearing is intact to voice X: Uvula elevates symmetrically XI: Shoulder shrug is symmetric. XII: tongue is midline without atrophy or fasciculations.  Motor: He does not cooperate with any strength testing, but he does lift both arms against gravity, though he does not keep them lifted, he moves bilateral legs voluntarily, but does not lift  them against gravity.   Sensory: Sensation is symmetric to light touch Cerebellar: He does not cooperate, but clearly has essential tremor bilaterally   I have reviewed labs in epic and the results pertinent to this consultation are: CMP- unremarkable CBC-mild leukocytosis  I have reviewed the images obtained: CT head-unremarkable  Impression: 84 year old male with altered mental status in the setting of fever,?  Cellulitis.  I  suspect that this represents encephalopathy due to infection, however with the question of a left facial droop, I do think an MRI would be prudent.  But not do further work-up from a neurological perspective unless the MRI is positive.  Recommendations: 1) MRI brain 2) further neurological work-up only if MRI is positive   Roland Rack, MD [facility] [phone]  If 7pm- 7am, please page neurology on call as listed in [place].

## 2017-12-29 NOTE — ED Triage Notes (Signed)
Pt arrived via [facility]; [facility] and then put bk in bed by nsg staff; pt woke up approx 7/8a with L sided facial droop and LA wkness; also presented slurred speech; no blood thinners ; pt has hx of wkness, encephalopathy; abn gait; dementia; CBG 136; 143/87, 108, 18, 95, 100.9

## 2017-12-29 NOTE — ED Provider Notes (Signed)
[facility] EMERGENCY DEPARTMENT Provider Note   CSN: [id number] Arrival date & time: 12/29/17  2376     History   Chief Complaint Chief Complaint  Patient presents with  . Code Stroke    HPI Barry Molina is a 84 y.o. male.  The history is provided by the EMS personnel, medical records and the nursing home. No language interpreter was used.   Barry Molina is a 84 y.o. male who presents to the Emergency Department complaining of code stroke. Level V caveat due to confusion. He is a resident at [facility]. Per nursing home he had a fall out of bed around one in the morning and was put back in bed. When he was awoken at 7 AM he was noted to be agitated, confused and weak. He appeared to be leading to the left and unable to stand. Nursing reports that he was more sleepy yesterday and spent more time in bed than usual. At baseline he walks with a walker and is able to communicate and is not confused.  Past Medical History:  Diagnosis Date  . B12 DEFICIENCY 12/09/2007  . Cataract   . COLONIC POLYPS, HX OF 12/14/2006  . CORONARY ARTERY BYPASS GRAFT, HX OF 08/26/2008  . CORONARY ARTERY DISEASE 12/14/2006  . Edema 08/27/2008  . ELBOW PAIN 10/06/2009  . GERD 12/14/2006  . HYPERLIPIDEMIA 12/14/2006  . HYPERTENSION 12/14/2006  . HYPOTHYROIDISM 12/14/2006  . LEG PAIN 05/10/2010  . Malignant melanoma of skin of upper limb, including shoulder ([place]) 09/07/2008   states he just had places removed;  not cancers  . MUSCLE PAIN 04/10/2007  . NEOPLASM, SKIN, UNCERTAIN BEHAVIOR 2/83/1517  . Other specified acquired hypothyroidism 04/08/2007  . Personal history of other diseases of digestive disease 08/26/2008  . SHOULDER PAIN 12/19/2006    Patient Active Problem List   Diagnosis Date Noted  . Cellulitis 12/29/2017  . Confusion 09/17/2017  . Acute hypernatremia   . Patella fracture 04/02/2016  . Elevated troponin 04/02/2016  . Arthritis of right knee  01/05/2016  . Acute confusional state 07/12/2015  . Headache 07/12/2015  . Post-concussion syndrome 07/12/2015  . Fracture of cervical vertebra, C5 ([place]) 07/12/2015  . Constipation 06/01/2015  . Acute encephalopathy 04/06/2015  . Well adult exam 03/31/2015  . Left rotator cuff tear arthropathy 04/23/2014  . Trigger index finger of right hand 01/07/2014  . Contusion of face 12/06/2012  . Pansinusitis 12/06/2012  . Right knee pain 11/13/2012  . Ataxia 08/08/2012  . Neuropathy of both feet 08/08/2012  . Fall at home 10/23/2011  . Weight loss, abnormal 11/16/2010  . LEG PAIN 05/10/2010  . ELBOW PAIN 10/06/2009  . Malignant melanoma of skin of upper limb, including shoulder ([place]) 09/07/2008  . Edema 08/27/2008  . Personal history of other diseases of digestive system 08/26/2008  . CORONARY ARTERY BYPASS GRAFT, HX OF 08/26/2008  . NEOPLASM, SKIN, UNCERTAIN BEHAVIOR 61/60/7371  . B12 deficiency 12/09/2007  . MUSCLE PAIN 04/10/2007  . SHOULDER PAIN 12/19/2006  . Hypothyroidism 12/14/2006  . Dyslipidemia 12/14/2006  . Essential hypertension 12/14/2006  . Coronary atherosclerosis 12/14/2006  . GERD 12/14/2006  . COLONIC POLYPS, HX OF 12/14/2006    Past Surgical History:  Procedure Laterality Date  . BACK SURGERY  09/27/10  . CHOLECYSTECTOMY  9/12  . CORONARY ARTERY BYPASS GRAFT    . right ankle surgery    . ROTATOR CUFF REPAIR     bilateral  . SPINE SURGERY  8/12  LS spine cyst        Home Medications    Prior to Admission medications   Medication Sig Start Date End Date Taking? Authorizing Provider  acetaminophen (TYLENOL) 325 MG tablet Take 650 mg by mouth 4 (four) times daily. Take ever day per daughter   Yes [provider]  aspirin 325 MG EC tablet Take 325 mg by mouth daily.   Yes [provider]  cyanocobalamin 1000 MCG tablet Take 1,000 mcg by mouth daily.   Yes [provider]  diclofenac sodium (VOLTAREN) 1 % GEL Apply 2 g topically  as needed.   Yes [provider]  fluticasone (FLONASE) 50 MCG/ACT nasal spray USE 1 SPRAY IN EACH NOSTRIL DAILY AS NEEDED 02/14/12  Yes Plotnikov, Evie Lacks, MD  furosemide (LASIX) 20 MG tablet TAKE 1 TO 2 TABLETS BY MOUTH ONCE DAILY AS NEEDED FOR EDEMA Patient taking differently: Take 20 mg by mouth as needed.  07/24/17  Yes Plotnikov, Evie Lacks, MD  levothyroxine (SYNTHROID, LEVOTHROID) 125 MCG tablet TAKE 1 TABLET BY MOUTH ONCE DAILY Patient taking differently: Take 125 mcg by mouth daily before breakfast.  05/14/17  Yes Plotnikov, Evie Lacks, MD  metoprolol succinate (TOPROL-XL) 25 MG 24 hr tablet TAKE 1/2 (ONE-HALF) TABLET BY MOUTH ONCE DAILY Patient taking differently: Take 25 mg by mouth daily.  09/16/17  Yes Plotnikov, Evie Lacks, MD  omeprazole (PRILOSEC) 40 MG capsule TAKE 1 CAPSULE BY MOUTH ONCE DAILY Patient taking differently: 40 mg daily.  08/27/17  Yes Plotnikov, Evie Lacks, MD  QUEtiapine (SEROQUEL) 25 MG tablet Take 1 tablet (25 mg total) by mouth at bedtime. Patient taking differently: Take 12.5-25 mg by mouth See admin instructions. Take 12.5 mg in the morning and 25 mg in the evening 09/20/17  Yes Adhikari, Tamsen Meek, MD  tamsulosin (FLOMAX) 0.4 MG CAPS capsule Take 1 capsule (0.4 mg total) by mouth daily. 09/20/17  Yes Shelly Coss, MD  traMADol (ULTRAM) 50 MG tablet Take 1 tablet (50 mg total) by mouth every 6 (six) hours as needed. 09/20/17  Yes Shelly Coss, MD  traZODone (DESYREL) 50 MG tablet Take 25 mg by mouth as needed for sleep (Agitation or restlessness).   Yes [provider]  Vitamin D, Ergocalciferol, (DRISDOL) 50000 units CAPS capsule TAKE 1 CAPSULE BY MOUTH ONCE A WEEK Patient taking differently: Take 50,000 Units by mouth every 7 (seven) days. Friday 09/20/17  Yes Lyndal Pulley, DO  cefdinir (OMNICEF) 300 MG capsule Take 1 capsule (300 mg total) by mouth 2 (two) times daily. Patient not taking: Reported on 12/29/2017 10/15/17   Plotnikov, Evie Lacks, MD   donepezil (ARICEPT) 5 MG tablet Take 1 tablet (5 mg total) by mouth at bedtime. Patient not taking: Reported on 12/29/2017 10/15/17   Plotnikov, Evie Lacks, MD    Family History Family History  Problem Relation Age of Onset  . Hypertension Father   . Cancer Father   . Stroke Mother 41  . Heart disease Other     Social History Social History   Tobacco Use  . Smoking status: Never Smoker  . Smokeless tobacco: Never Used  Substance Use Topics  . Alcohol use: No    Alcohol/week: 0.0 standard drinks  . Drug use: No     Allergies   Ezetimibe-simvastatin and Morphine   Review of Systems Review of Systems  All other systems reviewed and are negative.    Physical Exam Updated Vital Signs BP (!) 150/98 (BP Location: Right Arm)  Pulse 92   Temp (!) 100.5 F (38.1 C) (Oral)   Resp (!) 22   Wt 91.5 kg   SpO2 91%   BMI 27.36 kg/m   Physical Exam  Constitutional: He appears well-developed and well-nourished.  HENT:  Head: Normocephalic and atraumatic.  Dry mucous membranes  Cardiovascular: Regular rhythm.  No murmur heard. Tachycardic  Pulmonary/Chest: Effort normal. No respiratory distress.  Decreased air movement and bilateral bases  Abdominal: Soft. There is no tenderness. There is no rebound and no guarding.  Musculoskeletal: He exhibits no tenderness.  None pitting edema to bilateral lower extremities. 2+ DP pulses bilaterally. There is erythema to the left calf commission and foot. There is an ulceration to the left second toe with surrounding erythema. He does have local tenderness to the left second toe.  Neurological: He is alert.  Confused. Disoriented to place, time in recent events. There is mild left sided facial droop. There is generalized weakness. There is difficulty following simple commands.  Skin: Skin is warm and dry.  Nursing note and vitals reviewed.    ED Treatments / Results  Labs (all labs ordered are listed, but only abnormal results  are displayed) Labs Reviewed  CBC - Abnormal; Notable for the following components:      Result Value   WBC 11.0 (*)    All other components within normal limits  DIFFERENTIAL - Abnormal; Notable for the following components:   Neutro Abs 9.4 (*)    All other components within normal limits  COMPREHENSIVE METABOLIC PANEL - Abnormal; Notable for the following components:   Glucose, Bld 137 (*)    Total Bilirubin 1.4 (*)    All other components within normal limits  URINALYSIS, ROUTINE W REFLEX MICROSCOPIC - Abnormal; Notable for the following components:   Hgb urine dipstick MODERATE (*)    Ketones, ur 5 (*)    RBC / HPF >50 (*)    All other components within normal limits  CBC - Abnormal; Notable for the following components:   WBC 12.0 (*)    All other components within normal limits  TROPONIN I - Abnormal; Notable for the following components:   Troponin I 0.05 (*)    All other components within normal limits  GLUCOSE, CAPILLARY - Abnormal; Notable for the following components:   Glucose-Capillary 124 (*)    All other components within normal limits  CBG MONITORING, ED - Abnormal; Notable for the following components:   Glucose-Capillary 131 (*)    All other components within normal limits  I-STAT CHEM 8, ED - Abnormal; Notable for the following components:   Glucose, Bld 131 (*)    Calcium, Ion 1.13 (*)    All other components within normal limits  I-STAT CG4 LACTIC ACID, ED - Abnormal; Notable for the following components:   Lactic Acid, Venous 1.96 (*)    All other components within normal limits  CULTURE, BLOOD (ROUTINE X 2)  CULTURE, BLOOD (ROUTINE X 2)  URINE CULTURE  PROTIME-INR  APTT  CREATININE, SERUM  TROPONIN I  TROPONIN I  BASIC METABOLIC PANEL  CBC  I-STAT TROPONIN, ED  I-STAT CG4 LACTIC ACID, ED    EKG EKG Interpretation  Date/Time:  Saturday December 29 2017 10:19:07 EDT Ventricular Rate:  95 PR Interval:    QRS Duration: 156 QT  Interval:  380 QTC Calculation: 478 R Axis:   27 Text Interpretation:  Atrial fibrillation Right bundle branch block Confirmed by Quintella Reichert [phone]) on 12/29/2017 10:40:26 AM  Radiology Ct Cervical Spine Wo Contrast  Result Date: 12/29/2017 CLINICAL DATA:  Fall.  Acute onset of left facial droop. EXAM: CT CERVICAL SPINE WITHOUT CONTRAST TECHNIQUE: Multidetector CT imaging of the cervical spine was performed without intravenous contrast. Multiplanar CT image reconstructions were also generated. COMPARISON:  CT of the cervical spine 09/17/2017 FINDINGS: Alignment: AP alignment scratched at slight degenerative anterolisthesis at C5-6 is stable. AP alignment is otherwise anatomic. Mild leftward curvature is stable. Skull base and vertebrae: The craniocervical junction demonstrates mild degenerative change. No acute or healing fractures are present. Soft tissues and spinal canal: Spinal canal is grossly patent. Atherosclerotic calcifications are present at the carotid bifurcations. No significant cervical adenopathy is present. Disc levels: Multilevel facet changes are worse right than left in the upper cervical spine. Grade 1 anterolisthesis associated with central foraminal narrowing bilaterally at C5-6. Upper chest: Lung apices are clear. Thoracic inlet is within normal limits. IMPRESSION: 1. No acute fracture or traumatic subluxation. 2. Stable degenerative changes of the cervical spine. Electronically Signed   By: San Morelle M.D.   On: 12/29/2017 09:23   Dg Chest Port 1 View  Result Date: 12/29/2017 CLINICAL DATA:  Altered mental status. EXAM: PORTABLE CHEST 1 VIEW COMPARISON:  09/17/2017 FINDINGS: Stable postsurgical changes from CABG. Calcific atherosclerotic disease and tortuosity of the aorta. Low lung volumes with bibasilar atelectasis. Osseous structures are without acute abnormality. Soft tissues are grossly normal. IMPRESSION: Low lung volumes with bibasilar atelectasis.  Electronically Signed   By: Fidela Salisbury M.D.   On: 12/29/2017 09:36   Dg Foot 2 Views Left  Result Date: 12/29/2017 CLINICAL DATA:  Fever today, ulcer LEFT foot EXAM: LEFT FOOT - 2 VIEW COMPARISON:  None FINDINGS: Osseous demineralization. Joint spaces preserved. No acute fracture, dislocation, or bone destruction. Diffuse soft tissue swelling at forefoot. No definite soft tissue gas. Scattered small vessel vascular calcifications. IMPRESSION: Osseous demineralization and soft tissue swelling of forefoot without acute bony abnormalities. Electronically Signed   By: Lavonia Dana M.D.   On: 12/29/2017 09:47   Ct Head Code Stroke Wo Contrast  Result Date: 12/29/2017 CLINICAL DATA:  Code stroke. Code stroke. Focal neuro deficit for less than 6 hours. Left facial droop. EXAM: CT HEAD WITHOUT CONTRAST TECHNIQUE: Contiguous axial images were obtained from the base of the skull through the vertex without intravenous contrast. COMPARISON:  CT head without contrast and cervical spine CT 09/17/2017 FINDINGS: Brain: Moderate diffuse white matter disease is again seen. No acute cortical infarct is present. A remote lacunar infarct is present in the right caudate head. Basal ganglia are otherwise intact. Insular ribbon is normal. Acute or focal cortical abnormality is present. Brainstem and cerebellum are normal. Ventricles are proportionate to the degree of atrophy. Vascular: Atherosclerotic calcifications are present within the cavernous internal carotid arteries bilaterally. There is no asymmetric hyperdense vessel. Visual calcifications are present the dural margin of the vertebral arteries bilaterally. Skull: Calvarium is intact. No focal lytic or blastic lesions are present. Sinuses/Orbits: The right maxillary sinus is opacified. Paranasal sinuses and mastoid air cells are otherwise clear. Bilateral lens replacements are present. Globes and orbits are otherwise within limits. ASPECTS [facility] Stroke Program  Early CT Score) - Ganglionic level infarction (caudate, lentiform nuclei, internal capsule, insula, M1-M3 cortex): 7/7 - Supraganglionic infarction (M4-M6 cortex): 3/3 Total score (0-10 with 10 being normal): 10/10 IMPRESSION: 1. No acute intracranial abnormality. 2. Stable atrophy and white matter disease, moderately advanced for age. This likely reflects the sequela of chronic microvascular  ischemia. 3. Remote lacunar infarct of the right caudate head. 4. Progressive right maxillary sinus disease. 5. ASPECTS is 10/10 The above was relayed via text pager to Dr. Leonel Ramsay on 12/29/2017 at 09:19 . Electronically Signed   By: San Morelle M.D.   On: 12/29/2017 09:19    Procedures Procedures (including critical care time) CRITICAL CARE Performed by: Quintella Reichert   Total critical care time: 35 minutes  Critical care time was exclusive of separately billable procedures and treating other patients.  Critical care was necessary to treat or prevent imminent or life-threatening deterioration.  Critical care was time spent personally by me on the following activities: development of treatment plan with patient and/or surrogate as well as nursing, discussions with consultants, evaluation of patient's response to treatment, examination of patient, obtaining history from patient or surrogate, ordering and performing treatments and interventions, ordering and review of laboratory studies, ordering and review of radiographic studies, pulse oximetry and re-evaluation of patient's condition.  Medications Ordered in ED Medications  donepezil (ARICEPT) tablet 5 mg (has no administration in time range)  acetaminophen (TYLENOL) tablet 650 mg (has no administration in time range)  aspirin EC tablet 325 mg (has no administration in time range)  QUEtiapine (SEROQUEL) tablet 25 mg (has no administration in time range)  traZODone (DESYREL) tablet 25 mg (has no administration in time range)  levothyroxine  (SYNTHROID, LEVOTHROID) tablet 125 mcg (has no administration in time range)  pantoprazole (PROTONIX) EC tablet 40 mg (has no administration in time range)  tamsulosin (FLOMAX) capsule 0.4 mg (has no administration in time range)  vitamin B-12 (CYANOCOBALAMIN) tablet 1,000 mcg (has no administration in time range)  Vitamin D (Ergocalciferol) (DRISDOL) capsule 50,000 Units (has no administration in time range)  fluticasone (FLONASE) 50 MCG/ACT nasal spray 1 spray (has no administration in time range)  enoxaparin (LOVENOX) injection 40 mg (has no administration in time range)  polyethylene glycol (MIRALAX / GLYCOLAX) packet 17 g (has no administration in time range)  haloperidol lactate (HALDOL) injection 2 mg (has no administration in time range)  ceFAZolin (ANCEF) IVPB 1 g/50 mL premix (has no administration in time range)  metoprolol succinate (TOPROL-XL) 24 hr tablet 50 mg (has no administration in time range)  ceFAZolin (ANCEF) IVPB 1 g/50 mL premix (0 g Intravenous Stopped 12/29/17 1124)  haloperidol lactate (HALDOL) injection 2.5 mg (2.5 mg Intramuscular Given 12/29/17 1214)     Initial Impression / Assessment and Plan / ED Course  I have reviewed the triage vital signs and the nursing notes.  Pertinent labs & imaging results that were available during my care of the patient were reviewed by me and considered in my medical decision making (see chart for details).     Patient presents to the emergency department with complaints of altered mental status. He is encephalopathic on examination with mild left-sided deficits. He is noted to be febrile with erythema and cellulitis changes to the left lower extremity. He was treated with antibiotics for possible cellulitis. EKG notes new onset atrial fibrillation. Patient and family updated of findings of studies and recommendation for admission and they are in agreement with treatment plan. Hospitalist consulted for admission.  Final Clinical  Impressions(s) / ED Diagnoses   Final diagnoses:  Fever    ED Discharge Orders    None       Quintella Reichert, MD 12/29/17 1714

## 2017-12-29 NOTE — ED Notes (Signed)
Attempted to call report to floor 

## 2017-12-29 NOTE — Progress Notes (Signed)
12/29/17 @ 12:18p  Per Dr. Abbott Pao is too unstable to remain still in MRI. Asked to call back later.

## 2017-12-29 NOTE — ED Notes (Signed)
DID IN AND OUT CATH ON PATIENT NO URINE IN RETURN I PLACED AS URINAL BETWEEN PATIENT LEGS TO SEE IF PATIENT CAN URINATE BY HISSELF PATIENT FAMILY IS AT BEDSIDE AND CALL BELL IN REACH

## 2017-12-30 ENCOUNTER — Inpatient Hospital Stay (HOSPITAL_COMMUNITY): Payer: Medicare Other

## 2017-12-30 ENCOUNTER — Encounter (HOSPITAL_COMMUNITY): Payer: Self-pay | Admitting: Family Medicine

## 2017-12-30 DIAGNOSIS — Y92009 Unspecified place in unspecified non-institutional (private) residence as the place of occurrence of the external cause: Secondary | ICD-10-CM

## 2017-12-30 DIAGNOSIS — E039 Hypothyroidism, unspecified: Secondary | ICD-10-CM

## 2017-12-30 DIAGNOSIS — I4891 Unspecified atrial fibrillation: Secondary | ICD-10-CM

## 2017-12-30 DIAGNOSIS — L0390 Cellulitis, unspecified: Secondary | ICD-10-CM

## 2017-12-30 DIAGNOSIS — I712 Thoracic aortic aneurysm, without rupture: Secondary | ICD-10-CM

## 2017-12-30 DIAGNOSIS — Z515 Encounter for palliative care: Secondary | ICD-10-CM

## 2017-12-30 DIAGNOSIS — F03918 Unspecified dementia, unspecified severity, with other behavioral disturbance: Secondary | ICD-10-CM

## 2017-12-30 DIAGNOSIS — W19XXXA Unspecified fall, initial encounter: Secondary | ICD-10-CM

## 2017-12-30 DIAGNOSIS — L03116 Cellulitis of left lower limb: Secondary | ICD-10-CM

## 2017-12-30 DIAGNOSIS — R609 Edema, unspecified: Secondary | ICD-10-CM

## 2017-12-30 DIAGNOSIS — R29810 Facial weakness: Secondary | ICD-10-CM

## 2017-12-30 DIAGNOSIS — F0391 Unspecified dementia with behavioral disturbance: Secondary | ICD-10-CM

## 2017-12-30 DIAGNOSIS — R29898 Other symptoms and signs involving the musculoskeletal system: Secondary | ICD-10-CM

## 2017-12-30 DIAGNOSIS — I7121 Aneurysm of the ascending aorta, without rupture: Secondary | ICD-10-CM

## 2017-12-30 DIAGNOSIS — I248 Other forms of acute ischemic heart disease: Secondary | ICD-10-CM

## 2017-12-30 HISTORY — DX: Thoracic aortic aneurysm, without rupture: I71.2

## 2017-12-30 HISTORY — DX: Aneurysm of the ascending aorta, without rupture: I71.21

## 2017-12-30 HISTORY — DX: Unspecified atrial fibrillation: I48.91

## 2017-12-30 LAB — BASIC METABOLIC PANEL
Anion gap: 7 (ref 5–15)
BUN: 12 mg/dL (ref 8–23)
CHLORIDE: 104 mmol/L (ref 98–111)
CO2: 24 mmol/L (ref 22–32)
Calcium: 8.5 mg/dL — ABNORMAL LOW (ref 8.9–10.3)
Creatinine, Ser: 0.82 mg/dL (ref 0.61–1.24)
GFR calc non Af Amer: >60 mL/min (ref >60)
GLUCOSE: 102 mg/dL — AB (ref 70–99)
Potassium: 3.6 mmol/L (ref 3.5–5.1)
Sodium: 135 mmol/L (ref 135–145)

## 2017-12-30 LAB — CBC
HEMATOCRIT: 38.9 % — AB (ref 39.0–52.0)
Hemoglobin: 12.7 g/dL — ABNORMAL LOW (ref 13.0–17.0)
MCH: 31.8 pg (ref 26.0–34.0)
MCHC: 32.6 g/dL (ref 30.0–36.0)
MCV: 97.3 fL (ref 80.0–100.0)
NRBC: 0 % (ref 0.0–0.2)
Platelets: 188 10*3/uL (ref 150–400)
RBC: 4 MIL/uL — ABNORMAL LOW (ref 4.22–5.81)
RDW: 12.8 % (ref 11.5–15.5)
WBC: 9.8 10*3/uL (ref 4.0–10.5)

## 2017-12-30 LAB — TROPONIN I
TROPONIN I: 0.21 ng/mL — AB (ref <0.03)
Troponin I: 0.19 ng/mL (ref <0.03)

## 2017-12-30 MED ORDER — LORAZEPAM 2 MG/ML IJ SOLN
0.5000 mg | Freq: Once | INTRAMUSCULAR | Status: AC
  Administered 2017-12-30: 0.5 mg via INTRAVENOUS
  Filled 2017-12-30: qty 1

## 2017-12-30 NOTE — Consult Note (Signed)
Consultation Note Date: 12/30/2017   Patient Name: Barry Molina  DOB: [birth year]  MRN: [id number]  Age / Sex: 84 y.o., male  PCP: Plotnikov, Evie Lacks, MD Referring Physician: Samuella Cota, MD  Reason for Consultation: Establishing goals of care  HPI/Patient Profile: 84 y.o. male  with past medical history of dementia, CAD status post CABG, hypothyroidism, 4.9 cm AAA noted on past echo, who was admitted on 12/29/2017 with altered mental status following a fall at the assisted living facility.  Initially code stroke was called but subsequently canceled after neurology evaluation.  Patient was found to be febrile with leukocytosis secondary to presumed cellulitis of the left lower extremity.  He was also found to have new onset A. fib.  Palliative care was consulted to help establish goals of care.  Clinical Assessment and Goals of Care: I met with patient's daughter in the room.  Patient currently receiving MRI of the brain.  Daughter describes a pattern of decline over the past several months.  Patient was previously living at home with caregivers before his hospitalization here in July 2019.  He then discharged to [facility] and was ultimately transitioned to [facility].  At baseline, patient is able to transfer with assistance but is primarily wheelchair-bound.  He requires assistance with activities of daily living.  Patient is normally conversive but family have noted worsening memory and confusion over the past months.  Daughter says she recognizes the patient is at high risk for future decline and that he may not return to his previous functional or cognitive baseline.  She says that the family's primary goal is to ensure that he is comfortable and well cared for at the assisted living facility.  We talked about possible future healthcare complications including stroke, infection, rupture of AAA,  decline in oral intake, and general failure to thrive.  Daughter again confirms desire to ensure that patient is comfortable and would like to prevent future rehospitalization if possible.  We discussed the option of hospice involvement at the ALF.  There had already been some discussion about involving palliative care.  However, daughter feels that the comprehensive nature of hospice would be better suited to managing patient's ongoing healthcare needs.  Daughter would like to pursue hospice care at ALF.  Patient is a widower for past 4 years.  He has 2 daughters, both of whom are involved in his care and share decision-making.  Patient is a retired Press photographer.  SUMMARY OF RECOMMENDATIONS   1.  Continue supportive care 2.  Care management consult to help with eventual disposition 3.  Hospice referral for care at ALF 4.  Family confirms DNR     Primary Diagnoses: Present on Admission: . (Resolved) Cellulitis . Acute encephalopathy . Hypothyroidism   I have reviewed the medical record, interviewed the patient and family, and examined the patient. The following aspects are pertinent.  Past Medical History:  Diagnosis Date  . Ascending aortic aneurysm ([place]) 12/30/2017  . B12 DEFICIENCY 12/09/2007  . Cataract   .  COLONIC POLYPS, HX OF 12/14/2006  . CORONARY ARTERY BYPASS GRAFT, HX OF 08/26/2008  . CORONARY ARTERY DISEASE 12/14/2006  . Edema 08/27/2008  . ELBOW PAIN 10/06/2009  . GERD 12/14/2006  . HYPERLIPIDEMIA 12/14/2006  . HYPERTENSION 12/14/2006  . HYPOTHYROIDISM 12/14/2006  . LEG PAIN 05/10/2010  . Malignant melanoma of skin of upper limb, including shoulder ([place]) 09/07/2008   states he just had places removed;  not cancers  . MUSCLE PAIN 04/10/2007  . NEOPLASM, SKIN, UNCERTAIN BEHAVIOR 06/23/8339  . Other specified acquired hypothyroidism 04/08/2007  . Personal history of other diseases of digestive disease 08/26/2008  . SHOULDER PAIN 12/19/2006  . Unspecified  atrial fibrillation ([place]) 12/30/2017   Social History   Socioeconomic History  . Marital status: Widowed    Spouse name: Not on file  . Number of children: Not on file  . Years of education: Not on file  . Highest education level: Not on file  Occupational History  . Not on file  Social Needs  . Financial resource strain: Not on file  . Food insecurity:    Worry: Not on file    Inability: Not on file  . Transportation needs:    Medical: Not on file    Non-medical: Not on file  Tobacco Use  . Smoking status: Never Smoker  . Smokeless tobacco: Never Used  Substance and Sexual Activity  . Alcohol use: No    Alcohol/week: 0.0 standard drinks  . Drug use: No  . Sexual activity: Not Currently  Lifestyle  . Physical activity:    Days per week: Not on file    Minutes per session: Not on file  . Stress: Not on file  Relationships  . Social connections:    Talks on phone: Not on file    Gets together: Not on file    Attends religious service: Not on file    Active member of club or organization: Not on file    Attends meetings of clubs or organizations: Not on file    Relationship status: Not on file  Other Topics Concern  . Not on file  Social History Narrative   Lost wife in Dec;   Living alone;    Children are close by; 2 dtrs live within walking distance   One level home      Stay there as long as he can   Family History  Problem Relation Age of Onset  . Hypertension Father   . Cancer Father   . Stroke Mother 69  . Heart disease Other    Scheduled Meds: . acetaminophen  650 mg Oral QID   Or  . acetaminophen  650 mg Rectal QID  . acetaminophen  650 mg Oral QID  . aspirin  325 mg Oral Daily  . donepezil  5 mg Oral QHS  . enoxaparin (LOVENOX) injection  40 mg Subcutaneous Q24H  . fluticasone  1 spray Each Nare Daily  . levothyroxine  125 mcg Oral Q0600  . metoprolol succinate  50 mg Oral Daily  . pantoprazole  40 mg Oral Daily  . QUEtiapine  25 mg Oral BID    . tamsulosin  0.4 mg Oral Daily  . cyanocobalamin  1,000 mcg Oral Daily  . [START ON 01/04/2018] Vitamin D (Ergocalciferol)  50,000 Units Oral Q7 days   Continuous Infusions: .  ceFAZolin (ANCEF) IV 1 g (12/30/17 1005)   PRN Meds:.haloperidol lactate, polyethylene glycol, traZODone Medications Prior to Admission:  Prior to Admission medications  Medication Sig Start Date End Date Taking? Authorizing Provider  acetaminophen (TYLENOL) 325 MG tablet Take 650 mg by mouth 4 (four) times daily. Take ever day per daughter   Yes [provider]  aspirin 325 MG EC tablet Take 325 mg by mouth daily.   Yes [provider]  cyanocobalamin 1000 MCG tablet Take 1,000 mcg by mouth daily.   Yes [provider]  diclofenac sodium (VOLTAREN) 1 % GEL Apply 2 g topically as needed.   Yes [provider]  fluticasone (FLONASE) 50 MCG/ACT nasal spray USE 1 SPRAY IN EACH NOSTRIL DAILY AS NEEDED 02/14/12  Yes Plotnikov, Evie Lacks, MD  furosemide (LASIX) 20 MG tablet TAKE 1 TO 2 TABLETS BY MOUTH ONCE DAILY AS NEEDED FOR EDEMA Patient taking differently: Take 20 mg by mouth as needed.  07/24/17  Yes Plotnikov, Evie Lacks, MD  levothyroxine (SYNTHROID, LEVOTHROID) 125 MCG tablet TAKE 1 TABLET BY MOUTH ONCE DAILY Patient taking differently: Take 125 mcg by mouth daily before breakfast.  05/14/17  Yes Plotnikov, Evie Lacks, MD  metoprolol succinate (TOPROL-XL) 25 MG 24 hr tablet TAKE 1/2 (ONE-HALF) TABLET BY MOUTH ONCE DAILY Patient taking differently: Take 25 mg by mouth daily.  09/16/17  Yes Plotnikov, Evie Lacks, MD  omeprazole (PRILOSEC) 40 MG capsule TAKE 1 CAPSULE BY MOUTH ONCE DAILY Patient taking differently: 40 mg daily.  08/27/17  Yes Plotnikov, Evie Lacks, MD  QUEtiapine (SEROQUEL) 25 MG tablet Take 1 tablet (25 mg total) by mouth at bedtime. Patient taking differently: Take 12.5-25 mg by mouth See admin instructions. Take 12.5 mg in the morning and 25 mg in the evening 09/20/17   Yes Adhikari, Tamsen Meek, MD  tamsulosin (FLOMAX) 0.4 MG CAPS capsule Take 1 capsule (0.4 mg total) by mouth daily. 09/20/17  Yes Shelly Coss, MD  traMADol (ULTRAM) 50 MG tablet Take 1 tablet (50 mg total) by mouth every 6 (six) hours as needed. 09/20/17  Yes Shelly Coss, MD  traZODone (DESYREL) 50 MG tablet Take 25 mg by mouth as needed for sleep (Agitation or restlessness).   Yes [provider]  Vitamin D, Ergocalciferol, (DRISDOL) 50000 units CAPS capsule TAKE 1 CAPSULE BY MOUTH ONCE A WEEK Patient taking differently: Take 50,000 Units by mouth every 7 (seven) days. Friday 09/20/17  Yes Lyndal Pulley, DO  cefdinir (OMNICEF) 300 MG capsule Take 1 capsule (300 mg total) by mouth 2 (two) times daily. Patient not taking: Reported on 12/29/2017 10/15/17   Plotnikov, Evie Lacks, MD  donepezil (ARICEPT) 5 MG tablet Take 1 tablet (5 mg total) by mouth at bedtime. Patient not taking: Reported on 12/29/2017 10/15/17   Plotnikov, Evie Lacks, MD   Allergies  Allergen Reactions  . Ezetimibe-Simvastatin Other (See Comments)     aches  . Morphine Nausea And Vomiting   Review of Systems  Unable to perform ROS   Physical Exam  Vital Signs: BP (!) 148/83 (BP Location: Right Arm)   Pulse 73   Temp 98.5 F (36.9 C) (Axillary)   Resp 16   Wt 91.5 kg   SpO2 92%   BMI 27.36 kg/m  Pain Scale: Faces   Pain Score: 0-No pain   SpO2: SpO2: 92 % O2 Device:SpO2: 92 % O2 Flow Rate: .   IO: Intake/output summary:   Intake/Output Summary (Last 24 hours) at 12/30/2017 1053 Last data filed at 12/30/2017 1005 Gross per 24 hour  Intake 281.62 ml  Output 800 ml  Net -518.38 ml    LBM:  Baseline Weight: Weight: 91.5 kg Most recent weight: Weight: 91.5 kg     Palliative Assessment/Data:     Time In: 1030 Time Out: 1100 Time Total: 30 minutes Greater than 50%  of this time was spent counseling and coordinating care related to the above assessment and plan.  Signed by: Irean Hong, NP   Please contact Palliative Medicine Team phone at [phone] for questions and concerns.  For individual provider: See Shea Evans

## 2017-12-30 NOTE — Evaluation (Signed)
Clinical/Bedside Swallow Evaluation Patient Details  Name: Barry Molina MRN: [id number] Date of Birth: 03/18/1925  Today's Date: 12/30/2017 Time: SLP Start Time (ACUTE ONLY): 4765 SLP Stop Time (ACUTE ONLY): 0905 SLP Time Calculation (min) (ACUTE ONLY): 31 min  Past Medical History:  Past Medical History:  Diagnosis Date  . Ascending aortic aneurysm ([place]) 12/30/2017  . B12 DEFICIENCY 12/09/2007  . Cataract   . COLONIC POLYPS, HX OF 12/14/2006  . CORONARY ARTERY BYPASS GRAFT, HX OF 08/26/2008  . CORONARY ARTERY DISEASE 12/14/2006  . Edema 08/27/2008  . ELBOW PAIN 10/06/2009  . GERD 12/14/2006  . HYPERLIPIDEMIA 12/14/2006  . HYPERTENSION 12/14/2006  . HYPOTHYROIDISM 12/14/2006  . LEG PAIN 05/10/2010  . Malignant melanoma of skin of upper limb, including shoulder ([place]) 09/07/2008   states he just had places removed;  not cancers  . MUSCLE PAIN 04/10/2007  . NEOPLASM, SKIN, UNCERTAIN BEHAVIOR 4/65/0354  . Other specified acquired hypothyroidism 04/08/2007  . Personal history of other diseases of digestive disease 08/26/2008  . SHOULDER PAIN 12/19/2006  . Unspecified atrial fibrillation ([place]) 12/30/2017   Past Surgical History:  Past Surgical History:  Procedure Laterality Date  . BACK SURGERY  09/27/10  . CHOLECYSTECTOMY  9/12  . CORONARY ARTERY BYPASS GRAFT    . right ankle surgery    . ROTATOR CUFF REPAIR     bilateral  . SPINE SURGERY  8/12   LS spine cyst   HPI:  84 yo male with h/o dementia admitted to [facility] with AMS and fall.  Pt found to have LLE cellulitis.  Pt also with afib in ED.  On IV ABX.  Swallow evaluation ordered.  Pt CXR showed bilateral ATX.  Pt with right caudate nucleus cva remote.  For MRI today.     Assessment / Plan / Recommendation Clinical Impression  Pt with mild symptoms of oral dysphagia resulting in suspected mild delay in oral transiting and oral residuals with solids.  No s/s of aspiration with po observed with pt self feeding *assist due to left hand  weakness*.  Pt observed with thin, applesauce and graham cracker.  Use of liquids helped to clear oral residuals.  Pt denies dysphagia prior to admit, however due to fever and dementia, will follow up x1 to assure tolerance.    SLP Visit Diagnosis: Dysphagia, oral phase (R13.11)    Aspiration Risk  Mild aspiration risk    Diet Recommendation Dysphagia 3 (Mech soft);Thin liquid   Liquid Administration via: Cup;Straw Medication Administration: Other (Comment)(as tolerated) Supervision: Patient able to self feed Compensations: Slow rate;Small sips/bites Postural Changes: Seated upright at 90 degrees    Other  Recommendations Oral Care Recommendations: Oral care QID   Follow up Recommendations None      Frequency and Duration min 1 x/week  1 week       Prognosis Prognosis for Safe Diet Advancement: Good Barriers to Reach Goals: Cognitive deficits      Swallow Study   General Date of Onset: 12/30/17 HPI: 84 yo male with h/o dementia admitted to [facility] with AMS and fall.  Pt found to have LLE cellulitis.  Pt also with afib in ED.  On IV ABX.  Swallow evaluation ordered.  Pt CXR showed bilateral ATX.  Pt with right caudate nucleus cva remote.  For MRI today.   Respiratory Status: Room air History of Recent Intubation: No Behavior/Cognition: Alert;Cooperative;Pleasant mood Oral Care Completed by SLP: Yes(excessive oral care needed) Oral Cavity - Dentition: Adequate natural dentition Vision: Functional  for self-feeding Baseline Vocal Quality: Normal Volitional Cough: Strong    Oral/Motor/Sensory Function   generalized weakness, dried secretions retained   Ice Chips   dnt  Thin Liquid Thin Liquid: Impaired Presentation: Cup;Straw;Spoon Pharyngeal  Phase Impairments: Suspected delayed Swallow;Multiple swallows    Nectar Thick Nectar Thick Liquid: Not tested   Honey Thick Honey Thick Liquid: Not tested   Puree Puree: Impaired Presentation: Spoon;Self Fed Oral Phase Functional  Implications: Prolonged oral transit Pharyngeal Phase Impairments: Suspected delayed Swallow   Solid     Solid: Impaired Presentation: Self Fed Oral Phase Functional Implications: Other (comment);Oral residue(slow but effective mastication, mild oral residuals adhered to hard palate, )      Macario Golds 12/30/2017,2:25 PM   Luanna Salk, MS [facility] SLP Acute Rehab Services Pager [phone] Office [phone]

## 2017-12-30 NOTE — Progress Notes (Signed)
VASCULAR LAB PRELIMINARY  PRELIMINARY  PRELIMINARY  PRELIMINARY  Left lower extremity venous duplex completed.    Preliminary report:  There is no DVT or SVT noted in the left lower extremity.  Enlarged inguinal lymph node noted.  Anisten Tomassi, RVT 12/30/2017, 11:48 AM

## 2017-12-30 NOTE — Progress Notes (Addendum)
PROGRESS NOTE  Barry Molina ION:[id number] DOB: 29-Aug-1925 DOA: 12/29/2017 PCP: Cassandria Anger, MD  Brief Narrative: 84 year old man PMH dementia, CABG, [facility], presented with history of fall out of bed, confusion, reported progressive decline in mental status over the last year, reported to have left-sided facial droop and weakness, code stroke was activated but canceled by neurology, on admission felt to have delirium secondary to fever and infectious process.  Other issues included new onset atrial fibrillation and left lower extremity cellulitis.  Assessment/Plan Acute encephalopathy, thought secondary to acute infection.  CT head and neck negative for acute findings.  Chest x-ray low lung volumes, vascular crowding. --Neurologic exam nonfocal except for possible mild left facial droop which is very mild.  MRI brain if patient can cooperate --Continue Seroquel  Bilateral LE weakness, facial droop --MRI brain as above  New onset atrial fibrillation with demand ischemia in the setting of fever, infection.  Echocardiogram July 2019 moderate severe aortic aneurysm, LVEF 24-40%, grade 1 diastolic dysfunction.  EKGs 11/2 0916: Atrial fibrillation ventricular rate 109, right bundle branch block, multiple PVCs.  Cannot rule out inferior infarct 1019: Atrial fibrillation, ventricular rate controlled, right bundle branch block, cannot rule out inferior infarct.  EKG 09/17/2017 sinus rhythm, first-degree AV block, right bundle branch block, cannot rule out inferior infarct, age unknown.  Taking this admission studies compared to previous, right bundle branch block is old, atrial fibrillation is new, inferior infarct morphology is old --Completely asymptomatic.  EKG is nonacute.   -- Already on metoprolol succinate as an outpatient.  Dose increased to 25 mg daily. --Check TSH --Not a candidate for anticoagulation secondary to multiple falls  Left lower extremity  cellulitis.  No wound seen. --Continue empiric antibiotics  Fall out of bed at outpt facility.  Reported multiple falls this year --PT, OT evaluations  Dementia with behavioral disturbance, agitation, treated with Seroquel at outpatient facility with recent dosage adjustment 10/27. --Continue donepezil, quetiapine 25 mg twice daily  Moderate to severe ascending aortic aneurysm seen on echocardiogram July 2019.  Family aware of diagnosis and risks.  Patient not a candidate for surgical intervention.  Chronic venous stasis, dermatitis --Supportive care  PMH CABG --Continue aspirin  Hypothyroidism --Continue levothyroxine.  Check TSH.   Continue empiric antibiotics, check TSH, continue metoprolol.  MRI brain if possible.  Palliative care consultation, patient has been declining over the last year  DVT prophylaxis: Enoxaparin Code Status: DNR Family Communication:  Disposition Plan: return to [facility]    Murray Hodgkins, MD  [facility] Direct contact: [phone] --Via amion app OR  --[email or web address]; password [email or web address]  7PM-7AM contact night coverage as above 12/30/2017, 8:27 AM  LOS: 1 day   Consultants:  Neurology   Procedures:    Antimicrobials:  Cefazolin 11/2 >>  Interval history/Subjective: T-max 101.6 combative overnight, attempting to strike staff; calm unless asked to do something.  Patient awake, denies complaints, no pain, no shortness of breath, no nausea or vomiting.  Unaware as to why he is here.  Objective: Vitals:  Vitals:   12/29/17 2350 12/30/17 0331  BP: 110/74 (!) 148/83  Pulse: 72 73  Resp: 16   Temp: 98.5 F (36.9 C) 98.5 F (36.9 C)  SpO2: 94% 92%    Exam:  Constitutional:  . Appears calm and comfortable Eyes:  . pupils and irises appear normal . Normal lids  ENMT:  . grossly normal hearing  . Perhaps slight facial droop on the left Respiratory:  . CTA  bilaterally, no w/r/r.  . Respiratory effort normal.    Cardiovascular:  . RRR, no m/r/g . Telemetry sinus rhythm with PVCs . No bilateral LE extremity edema   Abdomen:  . Soft Musculoskeletal:  .  RUE, LUE, RLE, LLE   o strength and tone normal, no atrophy, no abnormal movements Skin:  . Left lower extremity with erythema from ankle to knee, nontender, no drainage noted . palpation of skin: no induration or nodules Neurologic:  . Perhaps slight left facial droop.  Speech is fluent and clear. . Sensation all 4 extremities intact Psychiatric:  . Mental status o Mood, affect appropriate o Oriented to self only . judgment and insight appear impaired   I have personally reviewed the following:   Data: . CBG stable, troponin negative, lactic acid within normal limits on admission, repeat modestly elevated, mild leukocytosis on admission . Troponin 0.03, 0.05, 0.12, 0.21, repeat pending . A.m. blood work BMP unremarkable, potassium within normal limits, leukocytosis resolved, 9.8, remainder CBC unremarkable. . Hematuria noted on urinalysis, otherwise negative.  Scheduled Meds: . acetaminophen  650 mg Oral QID   Or  . acetaminophen  650 mg Rectal QID  . acetaminophen  650 mg Oral QID  . aspirin  325 mg Oral Daily  . donepezil  5 mg Oral QHS  . enoxaparin (LOVENOX) injection  40 mg Subcutaneous Q24H  . fluticasone  1 spray Each Nare Daily  . levothyroxine  125 mcg Oral Q0600  . metoprolol succinate  50 mg Oral Daily  . pantoprazole  40 mg Oral Daily  . QUEtiapine  25 mg Oral BID  . tamsulosin  0.4 mg Oral Daily  . cyanocobalamin  1,000 mcg Oral Daily  . [START ON 01/04/2018] Vitamin D (Ergocalciferol)  50,000 Units Oral Q7 days   Continuous Infusions: .  ceFAZolin (ANCEF) IV 1 g (12/30/17 0147)    Active Problems:   Cellulitis   LOS: 1 day

## 2017-12-31 LAB — URINE CULTURE

## 2017-12-31 LAB — TSH: TSH: 4.727 u[IU]/mL — AB (ref 0.350–4.500)

## 2017-12-31 MED ORDER — ENSURE ENLIVE PO LIQD
237.0000 mL | Freq: Two times a day (BID) | ORAL | Status: DC
  Administered 2017-12-31 – 2018-01-01 (×2): 237 mL via ORAL

## 2017-12-31 NOTE — Progress Notes (Signed)
Initial Nutrition Assessment  DOCUMENTATION CODES:   Not applicable  INTERVENTION:   - Add Ensure Enlive BID given poor PO intake (each provides 350 kcal and 20 g protein) - Continue vitamin B-12 and vitamin D   NUTRITION DIAGNOSIS:   Increased nutrient needs related to acute illness as evidenced by estimated needs.  GOAL:   Patient will meet greater than or equal to 90% of their needs  MONITOR:   PO intake, Supplement acceptance, Skin  REASON FOR ASSESSMENT:   Malnutrition Screening Tool    ASSESSMENT:   84 yo male, admitted for cellulitis of LLE. PMH significant for dementia, falls, HLD, HTN, CAD s/p CABG, hypothyroidism, GERD. Resides in ALF. Wheelchair dependent, uses walker with assistance. Family provided pt history upon admission.  Labs: troponin I 0.19 (H), Hgb 12.7, Hct 38.9, TSH 4.727 (H) Meds: levothyroxine, protonix, vitamin B-12, vitamin D  Pt asleep at time of visit. Woke up very briefly, then went right back to sleep. No family present to provide information.   Per chart, wt stable over last 1 year.   Ordered Ensure Enlive BID to support healing.  NUTRITION - FOCUSED PHYSICAL EXAM: Will need NFPE at follow up visit.  Diet Order:  5% of last 1 meal completed, per nsg Diet Order            DIET DYS 3 Room service appropriate? Yes; Fluid consistency: Thin  Diet effective now              EDUCATION NEEDS:  No education needs have been identified at this time  Skin:  Skin Assessment: Skin Integrity Issues: Skin Integrity Issues:: Other (Comment) Other: LLE cellulitis  Last BM:  PTA  Height:  Ht Readings from Last 1 Encounters:  10/15/17 6' (1.829 m)    Weight:  Wt Readings from Last 1 Encounters:  12/29/17 91.5 kg    Ideal Body Weight:  80.9 kg  BMI:  Body mass index is 27.36 kg/m.  Estimated Nutritional Needs:   Kcal:  1943 kcal/day(MSJ x 1.2)  Protein:  98-121 g protein/day(1.2-1.5 g/kg IBW)  Fluid:  2L daily or per MD  discretion  Althea Grimmer, MS, RDN, LDN On-call pager: [phone]

## 2017-12-31 NOTE — Evaluation (Signed)
Physical Therapy Evaluation Patient Details Name: Barry Molina MRN: [id number] DOB: September 17, 1925 Today's Date: 12/31/2017   History of Present Illness  Patient is a 84 y/o male who presents with Left sided weakness, facial droop and slurred speech as well as fall out of bed at ALF. Found to have fever, LLE cellulitis, new onset A-fib and bil atelectasis. Head CT/brain MRI-unremarkable. PMH includes HTN, HLD, CAd s/p CABG, AAA, dementia.  Clinical Impression  Patient presents with generalized weakness, baseline cognitive deficits, impaired balance and impaired mobility s/p above. Tolerated bed mobility, transfers and taking a few steps to get to a chair with Min-mod A of 2. Pt is from [facility]- requires assist for ADLs, mainly uses w/c for mobility propelling with feet and walking short distances with RW and assist PTA. Per daughter, pt was doing HHPT when he felt like it. Per daughter, pt has had a decline in functionl and cognitive status over the last few months. Plan is for pt to return to ALF with initiation of hospice care. Will follow acutely to maximize independence and mobility prior to return home.     Follow Up Recommendations Home health PT;Supervision for mobility/OOB    Equipment Recommendations  None recommended by PT    Recommendations for Other Services       Precautions / Restrictions Precautions Precautions: Fall Restrictions Weight Bearing Restrictions: No      Mobility  Bed Mobility Overal bed mobility: Needs Assistance Bed Mobility: Supine to Sit     Supine to sit: Mod assist;+2 for physical assistance;HOB elevated     General bed mobility comments: Able to initiate bringing BLEs to EOB, but needs help with the rest the way, to elevate trunk and scoot bottom.   Transfers Overall transfer level: Needs assistance Equipment used: Rolling walker (2 wheeled) Transfers: Sit to/from Stand Sit to Stand: Mod assist;+2 safety/equipment         General  transfer comment: Assist to power to standing with pt pulling up on RW. Cues for upright posture and hip extension.  Ambulation/Gait Ambulation/Gait assistance: Min assist;+2 safety/equipment Gait Distance (Feet): 3 Feet Assistive device: Rolling walker (2 wheeled) Gait Pattern/deviations: Step-to pattern;Trunk flexed;Shuffle Gait velocity: decreased   General Gait Details: Able to take a few steps to get to chair, sitting prematurely. Min A for balance.  Stairs            Wheelchair Mobility    Modified Rankin (Stroke Patients Only) Modified Rankin (Stroke Patients Only) Pre-Morbid Rankin Score: Moderately severe disability Modified Rankin: Moderately severe disability     Balance Overall balance assessment: History of Falls;Needs assistance Sitting-balance support: Feet supported;Single extremity supported Sitting balance-Leahy Scale: Fair     Standing balance support: During functional activity;Bilateral upper extremity supported Standing balance-Leahy Scale: Poor Standing balance comment: Requires BUE support and external support for static and dynamic standing.                             Pertinent Vitals/Pain Pain Assessment: Faces Faces Pain Scale: No hurt    Home Living Family/patient expects to be discharged to:: Assisted living               Home Equipment: Walker - 2 wheels;Wheelchair - manual Additional Comments: Pt lives in [facility].    Prior Function Level of Independence: Needs assistance   Gait / Transfers Assistance Needed: Minimal ambulation with RW and assist, uses w/c for all mobility using feet  to propel. Per daughter, able to transfer to w/c independently per her understanding.  ADL's / Homemaking Assistance Needed: Assist with all ADls.  Comments: Per daughter, pt has had recent decline over the last few months.     Hand Dominance   Dominant Hand: Right    Extremity/Trunk Assessment   Upper Extremity  Assessment Upper Extremity Assessment: Defer to OT evaluation    Lower Extremity Assessment Lower Extremity Assessment: Generalized weakness(Grossly ~3+/5 throughout)    Cervical / Trunk Assessment Cervical / Trunk Assessment: Kyphotic  Communication   Communication: No difficulties  Cognition Arousal/Alertness: Awake/alert Behavior During Therapy: Flat affect Overall Cognitive Status: History of cognitive impairments - at baseline Area of Impairment: Orientation;Memory;Following commands;Problem solving;Awareness                 Orientation Level: Disoriented to;Time;Situation;Place   Memory: Decreased short-term memory Following Commands: Follows one step commands with increased time(repetition)   Awareness: Intellectual Problem Solving: Slow processing;Requires verbal cues;Requires tactile cues General Comments: Seems irritable. A&Ox1, oriented to self. Thinks it is April.       General Comments General comments (skin integrity, edema, etc.): Family present.     Exercises     Assessment/Plan    PT Assessment Patient needs continued PT services  PT Problem List Decreased strength;Decreased mobility;Decreased safety awareness;Decreased activity tolerance;Decreased balance;Decreased cognition       PT Treatment Interventions Functional mobility training;Balance training;Patient/family education;Gait training;Therapeutic activities;Therapeutic exercise;Neuromuscular re-education;Cognitive remediation;DME instruction;Wheelchair mobility training    PT Goals (Current goals can be found in the Care Plan section)  Acute Rehab PT Goals Patient Stated Goal: none stated PT Goal Formulation: With patient Time For Goal Achievement: 01/14/18 Potential to Achieve Goals: Fair    Frequency Min 2X/week   Barriers to discharge        Co-evaluation PT/OT/SLP Co-Evaluation/Treatment: Yes Reason for Co-Treatment: For patient/therapist safety;To address functional/ADL  transfers;Necessary to address cognition/behavior during functional activity PT goals addressed during session: Mobility/safety with mobility         AM-PAC PT "6 Clicks" Daily Activity  Outcome Measure Difficulty turning over in bed (including adjusting bedclothes, sheets and blankets)?: Unable Difficulty moving from lying on back to sitting on the side of the bed? : Unable Difficulty sitting down on and standing up from a chair with arms (e.g., wheelchair, bedside commode, etc,.)?: Unable Help needed moving to and from a bed to chair (including a wheelchair)?: A Lot Help needed walking in hospital room?: A Lot Help needed climbing 3-5 steps with a railing? : Total 6 Click Score: 8    End of Session Equipment Utilized During Treatment: Gait belt Activity Tolerance: Patient tolerated treatment well Patient left: in chair;with call bell/phone within reach;with chair alarm set;with nursing/sitter in room;with family/visitor present Nurse Communication: Mobility status PT Visit Diagnosis: Muscle weakness (generalized) (M62.81);Unsteadiness on feet (R26.81);Difficulty in walking, not elsewhere classified (R26.2)    Time: 5643-3295 PT Time Calculation (min) (ACUTE ONLY): 24 min   Charges:   PT Evaluation $PT Eval Moderate Complexity: 1 Mod          Wray Kearns, PT, DPT Acute Rehabilitation Services Pager [phone] Office [phone]      [place] 12/31/2017, 11:54 AM

## 2017-12-31 NOTE — Care Management Note (Signed)
Case Management Note  Patient Details  Name: Barry Molina MRN: [id number] Date of Birth: 1925-06-08  Subjective/Objective:                    Action/Plan: CM consulted for patient to return to ALF with hospice. CM notified CSW of consult. They will handle facility d/c with hospice following. CM signing off.   Expected Discharge Date:                  Expected Discharge Plan:  Assisted Living / Rest Home(with hospice)  In-House Referral:  Clinical Social Work  Discharge planning Services     Post Acute Care Choice:    Choice offered to:     DME Arranged:    DME Agency:     HH Arranged:    [place] Agency:     Status of Service:  Completed, signed off  If discussed at [employer], dates discussed:    Additional Comments:  Pollie Friar, RN 12/31/2017, 2:28 PM

## 2017-12-31 NOTE — Plan of Care (Signed)
Pt resting quietly in bed, accompanied by 1:1 sitter until 3AM. Pt has been awake most of the shift, but not combative, and has only required PRN haldol x1 this shift. Pt had BM and condom catheter in intact and draining.  VSS, no acute events this shift.

## 2017-12-31 NOTE — Progress Notes (Signed)
Notified MD of low bp

## 2017-12-31 NOTE — Clinical Social Work Note (Signed)
Clinical Social Work Assessment  Patient Details  Name: Barry Molina MRN: [id number] Date of Birth: 1925/03/29  Date of referral:  12/31/17               Reason for consult:  Facility Placement                Permission sought to share information with:  Facility Sport and exercise psychologist, Family Supports Permission granted to share information::  Yes, Verbal Permission Granted  Name::     Carlene Coria  Agency::  [facility]  Relationship::  Daughters  Sport and exercise psychologist Information:     Housing/Transportation Living arrangements for the past 2 months:  [place] of Information:  Adult Children Patient Interpreter Needed:  None Criminal Activity/Legal Involvement Pertinent to Current Situation/Hospitalization:  No - Comment as needed Significant Relationships:  Adult Children Lives with:  Self, Facility Resident Do you feel safe going back to the place where you live?  Yes Need for family participation in patient care:  Yes (Comment)  Care giving concerns:  Patient from [facility] and family has no concerns about care received.   Social Worker assessment / plan:  CSW met with patient's daughter at bedside to discuss return to ALF. Patient's daughter confirmed preference to return to ALF with hospice involved, as the hospice care would be able to provide more for the patient moving forward. CSW to follow.  Employment status:  Retired Nurse, adult PT Recommendations:  Not assessed at this time Information / Referral to community resources:     Patient/Family's Response to care:  Patient's family agreeable for patient to return to ALF.  Patient/Family's Understanding of and Emotional Response to Diagnosis, Current Treatment, and Prognosis:  Patient's family aware that patient has been declining and that they would like to just focus on caring for his comfort. Patient's family ready for patient to return to [facility].  Emotional  Assessment Appearance:  Appears stated age Attitude/Demeanor/Rapport:  Unable to Assess Affect (typically observed):  Unable to Assess Orientation:  Oriented to Self Alcohol / Substance use:  Not Applicable Psych involvement (Current and /or in the community):  No (Comment)  Discharge Needs  Concerns to be addressed:  Care Coordination Readmission within the last 30 days:  No Current discharge risk:  Physical Impairment, Dependent with Mobility, Terminally ill Barriers to Discharge:  Continued Medical Work up   [employer], [place] 12/31/2017, 2:40 PM

## 2017-12-31 NOTE — Progress Notes (Signed)
PROGRESS NOTE  Barry Molina TMH:[id number] DOB: Sep 07, 1925 DOA: 12/29/2017 PCP: Cassandria Anger, MD   LOS: 2 days   Brief Narrative / Interim history: 84 year old male with dementia, coronary artery disease status post CABG, resides at nursing home who came in with a fall confusion, was found to be febrile in the ED and diagnosed with left lower extremity cellulitis.  It was also noted that he has had left-sided facial droop and weakness for which neurology was consulted  Subjective: -Underlying dementia, wakes up somewhat difficult, denies any complaints  Assessment & Plan: Principal Problem:   Cellulitis of left lower extremity without foot Active Problems:   Hypothyroidism   Acute encephalopathy   Weakness of both lower extremities   Facial droop   Fall at home, initial encounter   Dementia with behavioral disturbance (HCC)   Ascending aortic aneurysm (HCC)   Unspecified atrial fibrillation ([place])   Demand ischemia ([place])   Palliative care encounter   Acute metabolic encephalopathy in the setting of acute infection -CT head and neck without acute findings, MRI exam unremarkable without evidence of stroke -Continue Seroquel  Sepsis due to left lower extremity cellulitis -Patient started on Ancef, today day 3, significantly improved with minimal erythema noted, continue Ancef for hospitalized and transition to Keflex on discharge for a total of 7 days  New onset A. fib -Possibly in the setting of fever and infection/sepsis, rate controlled with metoprolol, not a candidate for anticoagulation secondary to multiple falls  Dementia with behavioral disturbances, agitation -Continue Seroquel, Aricept, discontinue sitter today as he appears calm  Moderate to severe ascending aortic aneurysm -Not a candidate for surgical intervention  Hypothyroidism -Continue Synthroid, TSH within acceptable limits for his age  Coronary artery disease with history of CABG -continue  aspirin, no reported chest pain   Scheduled Meds: . acetaminophen  650 mg Oral QID   Or  . acetaminophen  650 mg Rectal QID  . acetaminophen  650 mg Oral QID  . aspirin  325 mg Oral Daily  . donepezil  5 mg Oral QHS  . enoxaparin (LOVENOX) injection  40 mg Subcutaneous Q24H  . feeding supplement (ENSURE ENLIVE)  237 mL Oral BID BM  . fluticasone  1 spray Each Nare Daily  . levothyroxine  125 mcg Oral Q0600  . metoprolol succinate  50 mg Oral Daily  . pantoprazole  40 mg Oral Daily  . QUEtiapine  25 mg Oral BID  . tamsulosin  0.4 mg Oral Daily  . cyanocobalamin  1,000 mcg Oral Daily  . [START ON 01/04/2018] Vitamin D (Ergocalciferol)  50,000 Units Oral Q7 days   Continuous Infusions: .  ceFAZolin (ANCEF) IV 1 g (12/31/17 1030)   PRN Meds:.haloperidol lactate, polyethylene glycol, traZODone  DVT prophylaxis: Lovenox Code Status: DNR Family Communication: family at bedside Disposition Plan: SNF 1-2 days   Consultants:   None   Procedures:   None   Antimicrobials:  Ancef 11/2 >>    Objective: Vitals:   12/30/17 1522 12/30/17 2156 12/31/17 0749 12/31/17 1127  BP: 134/74 102/68 140/65 (!) 85/64  Pulse: 70 (!) 55 65 (!) 59  Resp: 18 16 20 19   Temp: 97.7 F (36.5 C) 98.1 F (36.7 C) 97.8 F (36.6 C) 97.7 F (36.5 C)  TempSrc: Oral Oral Oral Oral  SpO2: 93% 95% 98% 94%  Weight:        Intake/Output Summary (Last 24 hours) at 12/31/2017 1409 Last data filed at 12/31/2017 0500 Gross per 24 hour  Intake 201.18 ml  Output 1550 ml  Net -1348.82 ml   Filed Weights   12/29/17 1544  Weight: 91.5 kg    Examination:  Constitutional: NAD, lethargic  Eyes:  lids and conjunctivae normal ENMT: Mucous membranes are moist.  Neck: normal, supple Respiratory: clear to auscultation bilaterally, no wheezing, no crackles. Normal respiratory effort. No accessory muscle use.  Cardiovascular: Regular rate and rhythm, no murmurs / rubs / gallops. No LE edema. 2+ pedal pulses.  No carotid bruits.  Abdomen: no tenderness. Skin: no rashes, slight chronic venous stasis changes bilateral lower extremities, left lower extremity with fading erythema and improvement in cellulitis Neurologic: non focal, follows commands   Data Reviewed: I have independently reviewed following labs and imaging studies   CBC: Recent Labs  Lab 12/29/17 0853 12/29/17 0900 12/29/17 1243 12/30/17 0152  WBC 11.0*  --  12.0* 9.8  NEUTROABS 9.4*  --   --   --   HGB 13.8 14.3 13.5 12.7*  HCT 43.4 42.0 42.2 38.9*  MCV 99.8  --  99.1 97.3  PLT 192  --  188 161   Basic Metabolic Panel: Recent Labs  Lab 12/29/17 0853 12/29/17 0900 12/29/17 1243 12/30/17 0152  NA 138 140  --  135  K 4.1 4.5  --  3.6  CL 107 103  --  104  CO2 24  --   --  24  GLUCOSE 137* 131*  --  102*  BUN 12 18  --  12  CREATININE 1.05 1.00 1.01 0.82  CALCIUM 9.2  --   --  8.5*   GFR: Estimated Creatinine Clearance: 65.7 mL/min (by C-G formula based on SCr of 0.82 mg/dL). Liver Function Tests: Recent Labs  Lab 12/29/17 0853  AST 24  ALT 13  ALKPHOS 66  BILITOT 1.4*  PROT 7.0  ALBUMIN 3.8   No results for input(s): LIPASE, AMYLASE in the last 168 hours. No results for input(s): AMMONIA in the last 168 hours. Coagulation Profile: Recent Labs  Lab 12/29/17 0853  INR 1.05   Cardiac Enzymes: Recent Labs  Lab 12/29/17 1243 12/29/17 1842 12/30/17 0152 12/30/17 0820  TROPONINI 0.05* 0.12* 0.21* 0.19*   BNP (last 3 results) No results for input(s): PROBNP in the last 8760 hours. HbA1C: No results for input(s): HGBA1C in the last 72 hours. CBG: Recent Labs  Lab 12/29/17 0853 12/29/17 1627  GLUCAP 131* 124*   Lipid Profile: No results for input(s): CHOL, HDL, LDLCALC, TRIG, CHOLHDL, LDLDIRECT in the last 72 hours. Thyroid Function Tests: Recent Labs    12/31/17 0546  TSH 4.727*   Anemia Panel: No results for input(s): VITAMINB12, FOLATE, FERRITIN, TIBC, IRON, RETICCTPCT in the last 72  hours. Urine analysis:    Component Value Date/Time   COLORURINE YELLOW 12/29/2017 1312   APPEARANCEUR CLEAR 12/29/2017 1312   LABSPEC 1.017 12/29/2017 1312   PHURINE 6.0 12/29/2017 1312   GLUCOSEU NEGATIVE 12/29/2017 1312   GLUCOSEU NEGATIVE 07/12/2015 1200   HGBUR MODERATE (A) 12/29/2017 1312   BILIRUBINUR NEGATIVE 12/29/2017 1312   KETONESUR 5 (A) 12/29/2017 1312   PROTEINUR NEGATIVE 12/29/2017 1312   UROBILINOGEN 1.0 07/12/2015 1200   NITRITE NEGATIVE 12/29/2017 1312   LEUKOCYTESUR NEGATIVE 12/29/2017 1312   Sepsis Labs: Invalid input(s): PROCALCITONIN, LACTICIDVEN  Recent Results (from the past 240 hour(s))  Blood Culture (routine x 2)     Status: None (Preliminary result)   Collection Time: 12/29/17  9:30 AM  Result Value Ref Range Status  Specimen Description BLOOD RIGHT ARM  Final   Special Requests   Final    BOTTLES DRAWN AEROBIC AND ANAEROBIC Blood Culture adequate volume   Culture   Final    NO GROWTH 1 DAY Performed at [facility], [address] [place], [place] [address]    Report Status PENDING  Incomplete  Blood Culture (routine x 2)     Status: None (Preliminary result)   Collection Time: 12/29/17  9:44 AM  Result Value Ref Range Status   Specimen Description BLOOD LEFT FOREARM  Final   Special Requests   Final    BOTTLES DRAWN AEROBIC AND ANAEROBIC Blood Culture results may not be optimal due to an inadequate volume of blood received in culture bottles   Culture   Final    NO GROWTH 1 DAY Performed at [facility], [place] [address], [place], [place] [address]    Report Status PENDING  Incomplete  Urine culture     Status: Abnormal (Preliminary result)   Collection Time: 12/29/17  1:12 PM  Result Value Ref Range Status   Specimen Description URINE, CLEAN CATCH  Final   Special Requests NONE  Final   Culture (A)  Final    <10,000 COLONIES/mL INSIGNIFICANT GROWTH Performed at [facility], [place] [address], [place], [place]  [address]    Report Status PENDING  Incomplete      Radiology Studies: Mr Brain Wo Contrast  Result Date: 12/30/2017 CLINICAL DATA:  Facial weakness. EXAM: MRI HEAD WITHOUT CONTRAST TECHNIQUE: Multiplanar, multiecho pulse sequences of the brain and surrounding structures were obtained without intravenous contrast. COMPARISON:  CT head without contrast 12/29/2017. MRI brain 04/06/2015 FINDINGS: Brain: The diffusion-weighted images demonstrate no acute or subacute infarction. Study is moderately degraded by patient motion. A remote subcortical white matter infarct in the posterior left temporal lobe is stable. Moderate periventricular white matter disease bilaterally is stable. The ventricles are of normal size. No significant extra-axial fluid collection is present. The basal ganglia are otherwise normal. Vascular: Flow is present in the major intracranial arteries. Skull and upper cervical spine: Craniocervical junction is normal. Upper cervical spine is unremarkable. Sinuses/Orbits: Chronic right maxillary sinus opacification is present. The remaining paranasal sinuses and the mastoid air cells are clear. Bilateral lens replacements are present. Globes and orbits are otherwise within normal limits. IMPRESSION: 1. No acute intracranial abnormality. 2. 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 3. Moderate diffuse white matter disease likely reflects the sequela of chronic microvascular ischemia. 4. Chronic right maxillary sinus opacification. Electronically Signed   By: San Morelle M.D.   On: 12/30/2017 11:16    Marzetta Board, MD, PhD [facility] Pager [phone]  If 7PM-7AM, please contact night-coverage [email or web address] Password [email or web address] 12/31/2017, 2:09 PM

## 2017-12-31 NOTE — Evaluation (Signed)
Occupational Therapy Evaluation Patient Details Name: Barry Molina MRN: [id number] DOB: 09/16/1925 Today's Date: 12/31/2017    History of Present Illness Patient is a 84 y/o male who presents with Left sided weakness, facial droop and slurred speech as well as fall out of bed at ALF. Found to have fever, LLE cellulitis, new onset A-fib and bil atelectasis. Head CT/brain MRI-unremarkable. PMH includes HTN, HLD, CAd s/p CABG, AAA, dementia.   Clinical Impression   PTA patients family reports needing assist for ADLs, mainly using w/c for mobility and walking short distances with RW.  Daugther reports decline in functional and cognitive status over the past few months.  Currently admitted for above and limited by impaired cognition, decreased activity tolerance, impaired balance, and generalized weakness.  Pt currently requires mod assist +2 for bed mobility and transfers using RW, completes grooming seated supported with min assist.  Plan is to return back to ALF.  Will continue to follow acutely and recommend HHOT at discharge.       Follow Up Recommendations  Home health OT;Supervision/Assistance - 24 hour    Equipment Recommendations  None recommended by OT    Recommendations for Other Services       Precautions / Restrictions Precautions Precautions: Fall Restrictions Weight Bearing Restrictions: No      Mobility Bed Mobility Overal bed mobility: Needs Assistance Bed Mobility: Supine to Sit     Supine to sit: Mod assist;+2 for physical assistance;HOB elevated     General bed mobility comments: assist to bring BLEs completely off EOB and elevate trunk  Transfers Overall transfer level: Needs assistance Equipment used: Rolling walker (2 wheeled) Transfers: Sit to/from Stand Sit to Stand: Mod assist;+2 safety/equipment         General transfer comment: Assist to power to standing with pt pulling up on RW. Cues for upright posture and hip extension.    Balance  Overall balance assessment: History of Falls;Needs assistance Sitting-balance support: Feet supported;Single extremity supported Sitting balance-Leahy Scale: Fair     Standing balance support: During functional activity;Bilateral upper extremity supported Standing balance-Leahy Scale: Poor Standing balance comment: Requires BUE support and external support for static and dynamic standing.                           ADL either performed or assessed with clinical judgement   ADL Overall ADL's : Needs assistance/impaired     Grooming: Minimal assistance;Sitting   Upper Body Bathing: Minimal assistance;Sitting   Lower Body Bathing: Moderate assistance;+2 for safety/equipment;Sit to/from stand   Upper Body Dressing : Moderate assistance;Sitting   Lower Body Dressing: Total assistance;Sit to/from stand   Toilet Transfer: Moderate assistance;+2 for physical assistance;Ambulation;RW;+2 for safety/equipment   Toileting- Clothing Manipulation and Hygiene: Total assistance;Sit to/from stand;+2 for physical assistance       Functional mobility during ADLs: Moderate assistance;+2 for physical assistance;Rolling walker;Cueing for safety;Cueing for sequencing General ADL Comments: completed bed mobility, transfers and ADLs     Vision   Vision Assessment?: No apparent visual deficits     Perception     Praxis      Pertinent Vitals/Pain Pain Assessment: Faces Faces Pain Scale: No hurt     Hand Dominance Right   Extremity/Trunk Assessment Upper Extremity Assessment Upper Extremity Assessment: Generalized weakness(grossly 3+/5 B UES, limited shoulder flexion to 90)   Lower Extremity Assessment Lower Extremity Assessment: Defer to PT evaluation   Cervical / Trunk Assessment Cervical / Trunk Assessment: Kyphotic  Communication Communication Communication: No difficulties   Cognition Arousal/Alertness: Awake/alert Behavior During Therapy: Flat affect Overall  Cognitive Status: History of cognitive impairments - at baseline Area of Impairment: Orientation;Memory;Following commands;Problem solving;Awareness;Safety/judgement                 Orientation Level: Disoriented to;Place;Time;Situation   Memory: Decreased recall of precautions;Decreased short-term memory Following Commands: Follows one step commands with increased time Safety/Judgement: Decreased awareness of safety;Decreased awareness of deficits Awareness: Intellectual Problem Solving: Slow processing;Requires verbal cues;Requires tactile cues;Decreased initiation;Difficulty sequencing General Comments: Seems irritable. A&Ox1, oriented to self. Thinks it is April.    General Comments  family present    Exercises     Shoulder Instructions      Home Living Family/patient expects to be discharged to:: Assisted living                             Home Equipment: Walker - 2 wheels;Wheelchair - manual   Additional Comments: Pt lives in [facility].      Prior Functioning/Environment Level of Independence: Needs assistance  Gait / Transfers Assistance Needed: Minimal ambulation with RW and assist, uses w/c for all mobility using feet to propel. Per daughter, able to transfer to w/c independently per her understanding. ADL's / Homemaking Assistance Needed: Assist with all ADls.   Comments: Per daughter, pt has had recent decline over the last few months.        OT Problem List: Decreased strength;Decreased activity tolerance;Decreased cognition;Decreased safety awareness;Impaired balance (sitting and/or standing);Decreased range of motion;Decreased knowledge of precautions      OT Treatment/Interventions: Self-care/ADL training;Therapeutic exercise;DME and/or AE instruction;Therapeutic activities;Patient/family education;Balance training;Cognitive remediation/compensation    OT Goals(Current goals can be found in the care plan section) Acute Rehab OT  Goals Patient Stated Goal: none stated Time For Goal Achievement: 01/14/18 Potential to Achieve Goals: Good  OT Frequency: Min 2X/week   Barriers to D/C:            Co-evaluation PT/OT/SLP Co-Evaluation/Treatment: Yes Reason for Co-Treatment: Necessary to address cognition/behavior during functional activity;For patient/therapist safety;To address functional/ADL transfers PT goals addressed during session: Mobility/safety with mobility OT goals addressed during session: ADL's and self-care;Other (comment)(mobility)      AM-PAC PT "6 Clicks" Daily Activity     Outcome Measure Help from another person eating meals?: A Little Help from another person taking care of personal grooming?: A Little Help from another person toileting, which includes using toliet, bedpan, or urinal?: Total Help from another person bathing (including washing, rinsing, drying)?: A Lot Help from another person to put on and taking off regular upper body clothing?: A Lot Help from another person to put on and taking off regular lower body clothing?: Total 6 Click Score: 12   End of Session Equipment Utilized During Treatment: Gait belt;Rolling walker Nurse Communication: Mobility status  Activity Tolerance: Patient tolerated treatment well Patient left: in chair;with call bell/phone within reach;with chair alarm set;with family/visitor present;with nursing/sitter in room  OT Visit Diagnosis: Other abnormalities of gait and mobility (R26.89);Muscle weakness (generalized) (M62.81);Other symptoms and signs involving cognitive function                Time: 5188-4166 OT Time Calculation (min): 23 min Charges:  OT General Charges $OT Visit: 1 Visit OT Evaluation $OT Eval Moderate Complexity: [place], OT Acute Rehabilitation Services Pager [phone] Office [phone]   Delight Stare 12/31/2017, 12:27 PM

## 2018-01-01 DIAGNOSIS — L03116 Cellulitis of left lower limb: Principal | ICD-10-CM

## 2018-01-01 MED ORDER — QUETIAPINE FUMARATE 25 MG PO TABS: 25.0000 mg | ORAL_TABLET | Freq: Every day | ORAL | Status: DC

## 2018-01-01 MED ORDER — CEPHALEXIN 250 MG PO CAPS: 250.0000 mg | ORAL_CAPSULE | Freq: Three times a day (TID) | ORAL | 0 refills | Status: AC

## 2018-01-01 MED ORDER — METOPROLOL SUCCINATE ER 50 MG PO TB24: 50.0000 mg | ORAL_TABLET | Freq: Every day | ORAL | Status: AC

## 2018-01-01 NOTE — Progress Notes (Signed)
Discharged patient to [facility], discharge instructions reviewed and all questions were answered.  PIV removed per policy.

## 2018-01-01 NOTE — Care Management Important Message (Signed)
Important Message  Patient Details  Name: Barry Molina MRN: [id number] Date of Birth: 1925/07/28   Medicare Important Message Given:  Yes    [name] [address] 01/01/2018, 2:59 PM

## 2018-01-01 NOTE — Discharge Summary (Addendum)
Physician Discharge Summary  Barry Molina NKN:[id number] DOB: 15-Jun-1925 DOA: 12/29/2017  PCP: Cassandria Anger, MD  Admit date: 12/29/2017 Discharge date: 01/01/2018  Time spent: 45 minutes  Recommendations for Outpatient Follow-up:  1. Needs Hospice FU at ALF 2. PCP in 1 week   Discharge Diagnoses:  Principal Problem:   Cellulitis of left lower extremity without foot Active Problems:   Hypothyroidism   Acute encephalopathy   Weakness of both lower extremities   Facial droop   Fall at home, initial encounter   Dementia with behavioral disturbance ([place])   Ascending aortic aneurysm (HCC)   Unspecified atrial fibrillation (HCC)   Demand ischemia (HCC)   Palliative care encounter   Discharge Condition: stable  Diet recommendation: mechanical soft diet with thin liquids, low sodium  Filed Weights   12/29/17 1544  Weight: 91.5 kg    History of present illness:  84 year old male with dementia, coronary artery disease status post CABG, resides at nursing home who came in with a fall confusion, was found to be febrile in the ED and diagnosed with left lower extremity cellulitis  Hospital Course:  Acute metabolic encephalopathy in the setting of acute infection -CT head and neck without acute findings, MRI exam unremarkable without evidence of stroke -suspected delirium in setting of infection, hospitalization -now stable and improving -Continue Seroquel, dose changed to 25mg  QHS -Seen by Palliative care this admission, due to advanced age and overall decline, plan for DNR and Hospice at ALF  Sepsis due to left lower extremity cellulitis -Patient started on Ancef,  significantly improved with minimal erythema noted,  transitioned to Keflex on discharge for 2 more days  New onset A. fib -Possibly in the setting of fever and infection/sepsis, rate controlled with metoprolol, not a candidate for anticoagulation secondary to multiple falls  Dementia with behavioral  disturbances, agitation -Continue Seroquel, Aricept -now stable, back to assisted living  Moderate to severe ascending aortic aneurysm -Not a candidate for surgical intervention  Hypothyroidism -Continue Synthroid, TSH within acceptable limits for his age  Coronary artery disease with history of CABG -continue aspirin, no reported chest pain  Consults Neurology Palliative medicine  Discharge Exam: Vitals:   01/01/18 0320 01/01/18 0742  BP: (!) 144/80 (!) 185/104  Pulse: 67 86  Resp: 20 19  Temp: 98 F (36.7 C) 97.8 F (36.6 C)  SpO2: 95% 93%    General: AAOx2 Cardiovascular: S!S2/RRR Respiratory: CTAB  Discharge Instructions   Discharge Instructions    Diet - low sodium heart healthy   Complete by:  As directed    Increase activity slowly   Complete by:  As directed      Allergies as of 01/01/2018      Reactions   Ezetimibe-simvastatin Other (See Comments)    aches   Morphine Nausea And Vomiting      Medication List    STOP taking these medications   cefdinir 300 MG capsule Commonly known as:  OMNICEF   donepezil 5 MG tablet Commonly known as:  ARICEPT     TAKE these medications   acetaminophen 325 MG tablet Commonly known as:  TYLENOL Take 650 mg by mouth 4 (four) times daily. Take ever day per daughter   aspirin 325 MG EC tablet Take 325 mg by mouth daily.   cephALEXin 250 MG capsule Commonly known as:  KEFLEX Take 1 capsule (250 mg total) by mouth 3 (three) times daily for 2 days. Notes to patient:  Start as soon as you get  your medicine from your pharmacy   cyanocobalamin 1000 MCG tablet Take 1,000 mcg by mouth daily.   diclofenac sodium 1 % Gel Commonly known as:  VOLTAREN Apply 2 g topically as needed.   fluticasone 50 MCG/ACT nasal spray Commonly known as:  FLONASE USE 1 SPRAY IN EACH NOSTRIL DAILY AS NEEDED   furosemide 20 MG tablet Commonly known as:  LASIX TAKE 1 TO 2 TABLETS BY MOUTH ONCE DAILY AS NEEDED FOR  EDEMA What changed:  See the new instructions.   levothyroxine 125 MCG tablet Commonly known as:  SYNTHROID, LEVOTHROID TAKE 1 TABLET BY MOUTH ONCE DAILY What changed:  when to take this   metoprolol succinate 50 MG 24 hr tablet Commonly known as:  TOPROL-XL Take 1 tablet (50 mg total) by mouth daily. What changed:    medication strength  See the new instructions.   omeprazole 40 MG capsule Commonly known as:  PRILOSEC TAKE 1 CAPSULE BY MOUTH ONCE DAILY What changed:  how to take this   QUEtiapine 25 MG tablet Commonly known as:  SEROQUEL Take 1 tablet (25 mg total) by mouth at bedtime. What changed:    how much to take  when to take this  additional instructions   tamsulosin 0.4 MG Caps capsule Commonly known as:  FLOMAX Take 1 capsule (0.4 mg total) by mouth daily.   traMADol 50 MG tablet Commonly known as:  ULTRAM Take 1 tablet (50 mg total) by mouth every 6 (six) hours as needed.   traZODone 50 MG tablet Commonly known as:  DESYREL Take 25 mg by mouth as needed for sleep (Agitation or restlessness).   Vitamin D (Ergocalciferol) 50000 units Caps capsule Commonly known as:  DRISDOL TAKE 1 CAPSULE BY MOUTH ONCE A WEEK What changed:    when to take this  additional instructions      Allergies  Allergen Reactions  . Ezetimibe-Simvastatin Other (See Comments)     aches  . Morphine Nausea And Vomiting    Contact information for follow-up providers    Plotnikov, Evie Lacks, MD. Schedule an appointment as soon as possible for a visit in 1 week(s).   Specialty:  Internal Medicine Contact information: [address] [place] [address] [phone]            Contact information for after-discharge care    Destination    [facility] .   Service:  Assisted Living Contact information: [place] [phone]                   The results of significant diagnostics from  this hospitalization (including imaging, microbiology, ancillary and laboratory) are listed below for reference.    Significant Diagnostic Studies: Ct Cervical Spine Wo Contrast  Result Date: 12/29/2017 CLINICAL DATA:  Fall.  Acute onset of left facial droop. EXAM: CT CERVICAL SPINE WITHOUT CONTRAST TECHNIQUE: Multidetector CT imaging of the cervical spine was performed without intravenous contrast. Multiplanar CT image reconstructions were also generated. COMPARISON:  CT of the cervical spine 09/17/2017 FINDINGS: Alignment: AP alignment scratched at slight degenerative anterolisthesis at C5-6 is stable. AP alignment is otherwise anatomic. Mild leftward curvature is stable. Skull base and vertebrae: The craniocervical junction demonstrates mild degenerative change. No acute or healing fractures are present. Soft tissues and spinal canal: Spinal canal is grossly patent. Atherosclerotic calcifications are present at the carotid bifurcations. No significant cervical adenopathy is present. Disc levels: Multilevel facet changes are  worse right than left in the upper cervical spine. Grade 1 anterolisthesis associated with central foraminal narrowing bilaterally at C5-6. Upper chest: Lung apices are clear. Thoracic inlet is within normal limits. IMPRESSION: 1. No acute fracture or traumatic subluxation. 2. Stable degenerative changes of the cervical spine. Electronically Signed   By: San Morelle M.D.   On: 12/29/2017 09:23   Mr Brain Wo Contrast  Result Date: 12/30/2017 CLINICAL DATA:  Facial weakness. EXAM: MRI HEAD WITHOUT CONTRAST TECHNIQUE: Multiplanar, multiecho pulse sequences of the brain and surrounding structures were obtained without intravenous contrast. COMPARISON:  CT head without contrast 12/29/2017. MRI brain 04/06/2015 FINDINGS: Brain: The diffusion-weighted images demonstrate no acute or subacute infarction. Study is moderately degraded by patient motion. A remote subcortical white matter  infarct in the posterior left temporal lobe is stable. Moderate periventricular white matter disease bilaterally is stable. The ventricles are of normal size. No significant extra-axial fluid collection is present. The basal ganglia are otherwise normal. Vascular: Flow is present in the major intracranial arteries. Skull and upper cervical spine: Craniocervical junction is normal. Upper cervical spine is unremarkable. Sinuses/Orbits: Chronic right maxillary sinus opacification is present. The remaining paranasal sinuses and the mastoid air cells are clear. Bilateral lens replacements are present. Globes and orbits are otherwise within normal limits. IMPRESSION: 1. No acute intracranial abnormality. 2. 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 3. Moderate diffuse white matter disease likely reflects the sequela of chronic microvascular ischemia. 4. Chronic right maxillary sinus opacification. Electronically Signed   By: San Morelle M.D.   On: 12/30/2017 11:16   Dg Chest Port 1 View  Result Date: 12/29/2017 CLINICAL DATA:  Altered mental status. EXAM: PORTABLE CHEST 1 VIEW COMPARISON:  09/17/2017 FINDINGS: Stable postsurgical changes from CABG. Calcific atherosclerotic disease and tortuosity of the aorta. Low lung volumes with bibasilar atelectasis. Osseous structures are without acute abnormality. Soft tissues are grossly normal. IMPRESSION: Low lung volumes with bibasilar atelectasis. Electronically Signed   By: Fidela Salisbury M.D.   On: 12/29/2017 09:36   Dg Foot 2 Views Left  Result Date: 12/29/2017 CLINICAL DATA:  Fever today, ulcer LEFT foot EXAM: LEFT FOOT - 2 VIEW COMPARISON:  None FINDINGS: Osseous demineralization. Joint spaces preserved. No acute fracture, dislocation, or bone destruction. Diffuse soft tissue swelling at forefoot. No definite soft tissue gas. Scattered small vessel vascular calcifications. IMPRESSION: Osseous demineralization  and soft tissue swelling of forefoot without acute bony abnormalities. Electronically Signed   By: Lavonia Dana M.D.   On: 12/29/2017 09:47   Ct Head Code Stroke Wo Contrast  Result Date: 12/29/2017 CLINICAL DATA:  Code stroke. Code stroke. Focal neuro deficit for less than 6 hours. Left facial droop. EXAM: CT HEAD WITHOUT CONTRAST TECHNIQUE: Contiguous axial images were obtained from the base of the skull through the vertex without intravenous contrast. COMPARISON:  CT head without contrast and cervical spine CT 09/17/2017 FINDINGS: Brain: Moderate diffuse white matter disease is again seen. No acute cortical infarct is present. A remote lacunar infarct is present in the right caudate head. Basal ganglia are otherwise intact. Insular ribbon is normal. Acute or focal cortical abnormality is present. Brainstem and cerebellum are normal. Ventricles are proportionate to the degree of atrophy. Vascular: Atherosclerotic calcifications are present within the cavernous internal carotid arteries bilaterally. There is no asymmetric hyperdense vessel. Visual calcifications are present the dural margin of the vertebral arteries bilaterally. Skull: Calvarium is intact. No focal lytic or blastic lesions  are present. Sinuses/Orbits: The right maxillary sinus is opacified. Paranasal sinuses and mastoid air cells are otherwise clear. Bilateral lens replacements are present. Globes and orbits are otherwise within limits. ASPECTS [facility] Stroke Program Early CT Score) - Ganglionic level infarction (caudate, lentiform nuclei, internal capsule, insula, M1-M3 cortex): 7/7 - Supraganglionic infarction (M4-M6 cortex): 3/3 Total score (0-10 with 10 being normal): 10/10 IMPRESSION: 1. No acute intracranial abnormality. 2. Stable atrophy and white matter disease, moderately advanced for age. This likely reflects the sequela of chronic microvascular ischemia. 3. Remote lacunar infarct of the right caudate head. 4. Progressive right  maxillary sinus disease. 5. ASPECTS is 10/10 The above was relayed via text pager to Dr. Leonel Ramsay on 12/29/2017 at 09:19 . Electronically Signed   By: San Morelle M.D.   On: 12/29/2017 09:19   Vas [place] Lower Extremity Venous (dvt) (only [facility])  Result Date: 12/31/2017  Lower Venous Study Indications: Edema, and cellulitis.  Comparison Study: No prior study Performing Technologist: Sharion Dove RVS  Examination Guidelines: A complete evaluation includes B-mode imaging, spectral Doppler, color Doppler, and power Doppler as needed of all accessible portions of each vessel. Bilateral testing is considered an integral part of a complete examination. Limited examinations for reoccurring indications may be performed as noted.  Right Venous Findings: +---+---------------+---------+-----------+----------+-------+    CompressibilityPhasicitySpontaneityPropertiesSummary +---+---------------+---------+-----------+----------+-------+ CFVFull           Yes      Yes                          +---+---------------+---------+-----------+----------+-------+ SFJFull                                                 +---+---------------+---------+-----------+----------+-------+  Left Venous Findings: +---------+---------------+---------+-----------+----------+-------+          CompressibilityPhasicitySpontaneityPropertiesSummary +---------+---------------+---------+-----------+----------+-------+ CFV      Full           Yes      Yes                          +---------+---------------+---------+-----------+----------+-------+ SFJ      Full                                                 +---------+---------------+---------+-----------+----------+-------+ FV Prox  Full                                                 +---------+---------------+---------+-----------+----------+-------+ FV Mid   Full                                                  +---------+---------------+---------+-----------+----------+-------+ FV DistalFull                                                 +---------+---------------+---------+-----------+----------+-------+  PFV      Full                                                 +---------+---------------+---------+-----------+----------+-------+ POP      Full           Yes      Yes                          +---------+---------------+---------+-----------+----------+-------+ PTV      Full                                                 +---------+---------------+---------+-----------+----------+-------+ PERO     Full                                                 +---------+---------------+---------+-----------+----------+-------+ GSV      Full                                                 +---------+---------------+---------+-----------+----------+-------+    Summary: Right: No evidence of common femoral vein obstruction. Left: There is no evidence of deep vein thrombosis in the lower extremity.  *See table(s) above for measurements and observations. Electronically signed by Ruta Hinds MD on 12/31/2017 at 6:55:51 PM.    Final     Microbiology: Recent Results (from the past 240 hour(s))  Blood Culture (routine x 2)     Status: None (Preliminary result)   Collection Time: 12/29/17  9:30 AM  Result Value Ref Range Status   Specimen Description BLOOD RIGHT ARM  Final   Special Requests   Final    BOTTLES DRAWN AEROBIC AND ANAEROBIC Blood Culture adequate volume   Culture   Final    NO GROWTH 2 DAYS Performed at [facility], [address], [place], [place] [address]    Report Status PENDING  Incomplete  Blood Culture (routine x 2)     Status: None (Preliminary result)   Collection Time: 12/29/17  9:44 AM  Result Value Ref Range Status   Specimen Description BLOOD LEFT FOREARM  Final   Special Requests   Final    BOTTLES DRAWN AEROBIC AND ANAEROBIC Blood Culture  results may not be optimal due to an inadequate volume of blood received in culture bottles   Culture   Final    NO GROWTH 2 DAYS Performed at [facility], [place] [address], [place], [place] [address]    Report Status PENDING  Incomplete  Urine culture     Status: Abnormal   Collection Time: 12/29/17  1:12 PM  Result Value Ref Range Status   Specimen Description URINE, CLEAN CATCH  Final   Special Requests NONE  Final   Culture (A)  Final    <10,000 COLONIES/mL INSIGNIFICANT GROWTH Performed at [facility], [place] [address], [place], [place] [address]    Report Status 12/31/2017 FINAL  Final     Labs: Basic Metabolic Panel: Recent Labs  Lab 12/29/17 0853 12/29/17 0900 12/29/17 1243 12/30/17 0152  NA 138 140  --  135  K 4.1 4.5  --  3.6  CL 107 103  --  104  CO2 24  --   --  24  GLUCOSE 137* 131*  --  102*  BUN 12 18  --  12  CREATININE 1.05 1.00 1.01 0.82  CALCIUM 9.2  --   --  8.5*   Liver Function Tests: Recent Labs  Lab 12/29/17 0853  AST 24  ALT 13  ALKPHOS 66  BILITOT 1.4*  PROT 7.0  ALBUMIN 3.8   No results for input(s): LIPASE, AMYLASE in the last 168 hours. No results for input(s): AMMONIA in the last 168 hours. CBC: Recent Labs  Lab 12/29/17 0853 12/29/17 0900 12/29/17 1243 12/30/17 0152  WBC 11.0*  --  12.0* 9.8  NEUTROABS 9.4*  --   --   --   HGB 13.8 14.3 13.5 12.7*  HCT 43.4 42.0 42.2 38.9*  MCV 99.8  --  99.1 97.3  PLT 192  --  188 188   Cardiac Enzymes: Recent Labs  Lab 12/29/17 1243 12/29/17 1842 12/30/17 0152 12/30/17 0820  TROPONINI 0.05* 0.12* 0.21* 0.19*   BNP: BNP (last 3 results) No results for input(s): BNP in the last 8760 hours.  ProBNP (last 3 results) No results for input(s): PROBNP in the last 8760 hours.  CBG: Recent Labs  Lab 12/29/17 0853 12/29/17 1627  GLUCAP 131* 124*       Signed:  Domenic Polite MD.  [facility] 01/01/2018, 11:29 AM

## 2018-01-01 NOTE — Progress Notes (Signed)
Discharge to: [facility] Anticipated discharge date: 01/01/18 Family notified: Yes, at bedside Transportation by: [facility]  Report #: [phone]  [place] signing off.  Laveda Abbe LCSW [phone]

## 2018-01-01 NOTE — Progress Notes (Signed)
  Speech Language Pathology Treatment: Dysphagia  Patient Details Name: RANULFO KALL MRN: [id number] DOB: 1926/01/15 Today's Date: 01/01/2018 Time: 2725-3664 SLP Time Calculation (min) (ACUTE ONLY): 10 min  Assessment / Plan / Recommendation Clinical Impression  Pt alert today with breakfast tray in front of him.  He states he did not sleep well when directly asked.  He needed encouragement to consume milk - as breakfast tray looks barely touched.  He did consume entire container of milk with straw.  Delayed throat clearing noted with wet voice but pt's vitals are all stable.  Recommend to continue dys3/thin diet given pt's waxing/waning attention.  Will sign off as pt tolerated po.  Thanks   HPI HPI: 84 yo male with h/o dementia admitted to [facility] with AMS and fall.  Pt found to have LLE cellulitis.  Pt also with afib in ED.  On IV ABX.  Swallow evaluation ordered.  Pt CXR showed bilateral ATX.  Pt with right caudate nucleus cva remote.  For MRI today.        SLP Plan  All goals met       Recommendations  Diet recommendations: Dysphagia 3 (mechanical soft);Thin liquid Liquids provided via: Cup;Straw Supervision: Patient able to self feed Compensations: Slow rate;Small sips/bites Postural Changes and/or Swallow Maneuvers: Seated upright 90 degrees;Upright 30-60 min after meal                Oral Care Recommendations: Oral care BID SLP Visit Diagnosis: Dysphagia, oral phase (R13.11) Plan: All goals met       GO                Macario Golds 01/01/2018, 8:47 AM Luanna Salk, MS [facility] SLP Acute Rehab Services Pager [phone] Office [phone]

## 2018-01-01 NOTE — [place] FL2 (Signed)
[place] MEDICAID FL2 LEVEL OF CARE SCREENING TOOL     IDENTIFICATION  Patient Name: Barry Molina Birthdate: 30-Aug-1925 Sex: male Admission Date (Current Location): 12/29/2017  [facility] and [place] Number:  Herbalist and Address:  [facility] [place] [address], [place], [place] [address]      Provider Number: [id number]  Attending Physician Name and Address:  Domenic Polite, MD  Relative Name and Phone Number:       Current Level of Care: Hospital Recommended Level of Care: [place] Prior Approval Number:    Date Approved/Denied:   PASRR Number:    Discharge Plan: Other (Comment)(ALF)    Current Diagnoses: Patient Active Problem List   Diagnosis Date Noted  . Cellulitis of left lower extremity without foot 12/30/2017  . Weakness of both lower extremities 12/30/2017  . Facial droop 12/30/2017  . Fall at home, initial encounter 12/30/2017  . Dementia with behavioral disturbance ([place]) 12/30/2017  . Ascending aortic aneurysm ([place]) 12/30/2017  . Unspecified atrial fibrillation ([place]) 12/30/2017  . Demand ischemia ([place]) 12/30/2017  . Palliative care encounter   . Patella fracture 04/02/2016  . Arthritis of right knee 01/05/2016  . Post-concussion syndrome 07/12/2015  . Fracture of cervical vertebra, C5 ([place]) 07/12/2015  . Constipation 06/01/2015  . Acute encephalopathy 04/06/2015  . Well adult exam 03/31/2015  . Left rotator cuff tear arthropathy 04/23/2014  . Trigger index finger of right hand 01/07/2014  . Contusion of face 12/06/2012  . Pansinusitis 12/06/2012  . Right knee pain 11/13/2012  . Ataxia 08/08/2012  . Neuropathy of both feet 08/08/2012  . Fall at home 10/23/2011  . Weight loss, abnormal 11/16/2010  . LEG PAIN 05/10/2010  . ELBOW PAIN 10/06/2009  . Malignant melanoma of skin of upper limb, including shoulder ([place]) 09/07/2008  . Edema 08/27/2008  . Personal history of other diseases of digestive  system 08/26/2008  . CORONARY ARTERY BYPASS GRAFT, HX OF 08/26/2008  . NEOPLASM, SKIN, UNCERTAIN BEHAVIOR 81/19/1478  . B12 deficiency 12/09/2007  . MUSCLE PAIN 04/10/2007  . SHOULDER PAIN 12/19/2006  . Hypothyroidism 12/14/2006  . Dyslipidemia 12/14/2006  . Essential hypertension 12/14/2006  . Coronary atherosclerosis 12/14/2006  . GERD 12/14/2006  . COLONIC POLYPS, HX OF 12/14/2006    Orientation RESPIRATION BLADDER Height & Weight     Self, Place  Normal Incontinent Weight: 201 lb 11.5 oz (91.5 kg) Height:     BEHAVIORAL SYMPTOMS/MOOD NEUROLOGICAL BOWEL NUTRITION STATUS      Incontinent Diet(see DC summary)  AMBULATORY STATUS COMMUNICATION OF NEEDS Skin   Extensive Assist Verbally Normal                       Personal Care Assistance Level of Assistance  Bathing, Feeding, Dressing Bathing Assistance: Maximum assistance Feeding assistance: Limited assistance Dressing Assistance: Maximum assistance     Functional Limitations Info  Sight, Hearing, Speech Sight Info: Adequate Hearing Info: Adequate Speech Info: Adequate    SPECIAL CARE FACTORS FREQUENCY                       Contractures Contractures Info: Not present    Additional Factors Info  Code Status, Psychotropic, Allergies Code Status Info: FNR Allergies Info: Ezetimibe-simvastatin, Morphine Psychotropic Info: Aricept 5mg  daily at bed; Seroquel 25mg  daily at bed         Current Medications (01/01/2018):  This is the current hospital active medication list Current Facility-Administered  Medications  Medication Dose Route Frequency Provider Last Rate Last Dose  . acetaminophen (TYLENOL) tablet 650 mg  650 mg Oral QID Guilford Shi, MD   650 mg at 01/01/18 0944   Or  . acetaminophen (TYLENOL) suppository 650 mg  650 mg Rectal QID Guilford Shi, MD   650 mg at 12/29/17 1955  . acetaminophen (TYLENOL) tablet 650 mg  650 mg Oral QID Guilford Shi, MD   Stopped at 01/01/18 0945  .  aspirin EC tablet 325 mg  325 mg Oral Daily Guilford Shi, MD   325 mg at 01/01/18 0945  . ceFAZolin (ANCEF) IVPB 1 g/50 mL premix  1 g Intravenous Q8H Kamineni, Neelima, MD 100 mL/hr at 01/01/18 0600 1 g at 01/01/18 0600  . donepezil (ARICEPT) tablet 5 mg  5 mg Oral QHS Guilford Shi, MD   5 mg at 12/31/17 2205  . enoxaparin (LOVENOX) injection 40 mg  40 mg Subcutaneous Q24H Guilford Shi, MD   40 mg at 12/31/17 2209  . feeding supplement (ENSURE ENLIVE) (ENSURE ENLIVE) liquid 237 mL  237 mL Oral BID BM Caren Griffins, MD   237 mL at 01/01/18 0944  . fluticasone (FLONASE) 50 MCG/ACT nasal spray 1 spray  1 spray Each Nare Daily Guilford Shi, MD   1 spray at 12/31/17 0910  . haloperidol lactate (HALDOL) injection 2 mg  2 mg Intravenous Q8H PRN Guilford Shi, MD   2 mg at 01/01/18 0241  . levothyroxine (SYNTHROID, LEVOTHROID) tablet 125 mcg  125 mcg Oral Q0600 Guilford Shi, MD   125 mcg at 01/01/18 0601  . metoprolol succinate (TOPROL-XL) 24 hr tablet 50 mg  50 mg Oral Daily Guilford Shi, MD   50 mg at 01/01/18 0948  . pantoprazole (PROTONIX) EC tablet 40 mg  40 mg Oral Daily Guilford Shi, MD   40 mg at 01/01/18 0944  . polyethylene glycol (MIRALAX / GLYCOLAX) packet 17 g  17 g Oral Daily PRN Guilford Shi, MD      . QUEtiapine (SEROQUEL) tablet 25 mg  25 mg Oral QHS Domenic Polite, MD      . tamsulosin [facility]) capsule 0.4 mg  0.4 mg Oral Daily Guilford Shi, MD   0.4 mg at 01/01/18 0945  . traZODone (DESYREL) tablet 25 mg  25 mg Oral PRN Guilford Shi, MD      . vitamin B-12 (CYANOCOBALAMIN) tablet 1,000 mcg  1,000 mcg Oral Daily Guilford Shi, MD   1,000 mcg at 01/01/18 0944  . [START ON 01/04/2018] Vitamin D (Ergocalciferol) (DRISDOL) capsule 50,000 Units  50,000 Units Oral Q7 days Guilford Shi, MD         Discharge Medications: Please see discharge summary for a list of discharge medications.  Relevant Imaging Results:  Relevant  Lab Results:   Additional Information SS#: [id number]  Geralynn Ochs, LCSW

## 2018-01-03 LAB — CULTURE, BLOOD (ROUTINE X 2)
CULTURE: NO GROWTH
CULTURE: NO GROWTH
Special Requests: ADEQUATE

## 2019-04-23 DIAGNOSIS — Z20828 Contact with and (suspected) exposure to other viral communicable diseases: Secondary | ICD-10-CM | POA: Diagnosis not present

## 2019-05-01 DIAGNOSIS — I70235 Atherosclerosis of native arteries of right leg with ulceration of other part of foot: Secondary | ICD-10-CM | POA: Diagnosis not present

## 2019-05-07 DIAGNOSIS — F0391 Unspecified dementia with behavioral disturbance: Secondary | ICD-10-CM | POA: Diagnosis not present

## 2019-05-07 DIAGNOSIS — F329 Major depressive disorder, single episode, unspecified: Secondary | ICD-10-CM | POA: Diagnosis not present

## 2019-05-07 DIAGNOSIS — F419 Anxiety disorder, unspecified: Secondary | ICD-10-CM | POA: Diagnosis not present

## 2019-05-08 DIAGNOSIS — I70235 Atherosclerosis of native arteries of right leg with ulceration of other part of foot: Secondary | ICD-10-CM | POA: Diagnosis not present

## 2019-05-14 DIAGNOSIS — I70235 Atherosclerosis of native arteries of right leg with ulceration of other part of foot: Secondary | ICD-10-CM | POA: Diagnosis not present

## 2019-05-21 DIAGNOSIS — I70235 Atherosclerosis of native arteries of right leg with ulceration of other part of foot: Secondary | ICD-10-CM | POA: Diagnosis not present

## 2019-05-23 DIAGNOSIS — Z515 Encounter for palliative care: Secondary | ICD-10-CM | POA: Diagnosis not present

## 2019-05-23 DIAGNOSIS — E039 Hypothyroidism, unspecified: Secondary | ICD-10-CM | POA: Diagnosis not present

## 2019-05-28 DIAGNOSIS — I70235 Atherosclerosis of native arteries of right leg with ulceration of other part of foot: Secondary | ICD-10-CM | POA: Diagnosis not present

## 2019-06-04 DIAGNOSIS — I70235 Atherosclerosis of native arteries of right leg with ulceration of other part of foot: Secondary | ICD-10-CM | POA: Diagnosis not present

## 2019-06-11 DIAGNOSIS — I70235 Atherosclerosis of native arteries of right leg with ulceration of other part of foot: Secondary | ICD-10-CM | POA: Diagnosis not present

## 2019-06-18 DIAGNOSIS — I70235 Atherosclerosis of native arteries of right leg with ulceration of other part of foot: Secondary | ICD-10-CM | POA: Diagnosis not present

## 2019-06-25 DIAGNOSIS — I70235 Atherosclerosis of native arteries of right leg with ulceration of other part of foot: Secondary | ICD-10-CM | POA: Diagnosis not present

## 2019-07-01 DIAGNOSIS — I10 Essential (primary) hypertension: Secondary | ICD-10-CM | POA: Diagnosis not present

## 2019-07-01 DIAGNOSIS — E038 Other specified hypothyroidism: Secondary | ICD-10-CM | POA: Diagnosis not present

## 2019-07-01 DIAGNOSIS — E039 Hypothyroidism, unspecified: Secondary | ICD-10-CM | POA: Diagnosis not present

## 2019-08-08 ENCOUNTER — Telehealth: Payer: Self-pay | Admitting: Internal Medicine

## 2019-08-08 NOTE — Telephone Encounter (Signed)
Lattie Haw informed Morphine is noted as an allergy due to "nausea and vomiting". See allergies.

## 2019-08-08 NOTE — Telephone Encounter (Signed)
New message:   Lattie Haw from [facility] is calling and states she needs to get clarification on whether or not the pt is allergic to Morphine. Please advise.

## 2019-11-10 DIAGNOSIS — E039 Hypothyroidism, unspecified: Secondary | ICD-10-CM | POA: Diagnosis not present

## 2019-12-29 DEATH — deceased
